# Patient Record
Sex: Female | Born: 1970 | Race: White | Hispanic: No | Marital: Single | Smoking: Never smoker
Health system: Southern US, Community
[De-identification: ages and names within clinical notes are randomized; demographics above are authoritative.]

## PROBLEM LIST (undated history)

## (undated) DIAGNOSIS — J45909 Unspecified asthma, uncomplicated: Secondary | ICD-10-CM

## (undated) DIAGNOSIS — I10 Essential (primary) hypertension: Secondary | ICD-10-CM

## (undated) DIAGNOSIS — F1020 Alcohol dependence, uncomplicated: Secondary | ICD-10-CM

## (undated) DIAGNOSIS — Z8679 Personal history of other diseases of the circulatory system: Secondary | ICD-10-CM

## (undated) DIAGNOSIS — F988 Other specified behavioral and emotional disorders with onset usually occurring in childhood and adolescence: Secondary | ICD-10-CM

## (undated) DIAGNOSIS — E781 Pure hyperglyceridemia: Secondary | ICD-10-CM

## (undated) HISTORY — PX: OTHER SURGICAL HISTORY: SHX169

## (undated) HISTORY — DX: Unspecified asthma, uncomplicated: J45.909

## (undated) HISTORY — DX: Personal history of other diseases of the circulatory system: Z86.79

## (undated) HISTORY — DX: Other specified behavioral and emotional disorders with onset usually occurring in childhood and adolescence: F98.8

---
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
## 1998-08-01 ENCOUNTER — Emergency Department (HOSPITAL_COMMUNITY): Admission: EM | Admit: 1998-08-01 | Discharge: 1998-08-01 | Payer: Self-pay | Admitting: Emergency Medicine

## 1999-10-29 ENCOUNTER — Other Ambulatory Visit: Admission: RE | Admit: 1999-10-29 | Discharge: 1999-10-29 | Payer: Self-pay | Admitting: Gynecology

## 2001-07-02 ENCOUNTER — Other Ambulatory Visit: Admission: RE | Admit: 2001-07-02 | Discharge: 2001-07-02 | Payer: Self-pay | Admitting: Gynecology

## 2002-12-24 ENCOUNTER — Encounter: Admission: RE | Admit: 2002-12-24 | Discharge: 2002-12-24 | Payer: Self-pay | Admitting: Gynecology

## 2002-12-24 ENCOUNTER — Encounter: Payer: Self-pay | Admitting: Gynecology

## 2003-02-04 ENCOUNTER — Other Ambulatory Visit: Admission: RE | Admit: 2003-02-04 | Discharge: 2003-02-04 | Payer: Self-pay | Admitting: Gynecology

## 2003-03-22 ENCOUNTER — Emergency Department (HOSPITAL_COMMUNITY): Admission: EM | Admit: 2003-03-22 | Discharge: 2003-03-22 | Payer: Self-pay | Admitting: Emergency Medicine

## 2003-03-23 ENCOUNTER — Emergency Department (HOSPITAL_COMMUNITY): Admission: EM | Admit: 2003-03-23 | Discharge: 2003-03-23 | Payer: Self-pay | Admitting: Emergency Medicine

## 2004-03-21 ENCOUNTER — Encounter: Payer: Self-pay | Admitting: Internal Medicine

## 2004-03-21 LAB — HM MAMMOGRAPHY

## 2004-03-21 LAB — CONVERTED CEMR LAB

## 2004-05-03 ENCOUNTER — Ambulatory Visit: Payer: Self-pay | Admitting: Internal Medicine

## 2004-05-12 ENCOUNTER — Ambulatory Visit: Payer: Self-pay | Admitting: Internal Medicine

## 2004-05-24 ENCOUNTER — Ambulatory Visit: Payer: Self-pay

## 2004-06-24 ENCOUNTER — Ambulatory Visit: Payer: Self-pay | Admitting: Internal Medicine

## 2004-08-19 ENCOUNTER — Encounter: Admission: RE | Admit: 2004-08-19 | Discharge: 2004-08-19 | Payer: Self-pay | Admitting: Gynecology

## 2004-10-29 ENCOUNTER — Ambulatory Visit: Payer: Self-pay | Admitting: Internal Medicine

## 2004-11-30 ENCOUNTER — Other Ambulatory Visit: Admission: RE | Admit: 2004-11-30 | Discharge: 2004-11-30 | Payer: Self-pay | Admitting: Gynecology

## 2005-05-26 ENCOUNTER — Ambulatory Visit: Payer: Self-pay | Admitting: Internal Medicine

## 2005-07-06 ENCOUNTER — Emergency Department (HOSPITAL_COMMUNITY): Admission: EM | Admit: 2005-07-06 | Discharge: 2005-07-06 | Payer: Self-pay | Admitting: Emergency Medicine

## 2005-07-06 ENCOUNTER — Ambulatory Visit: Payer: Self-pay | Admitting: Internal Medicine

## 2005-10-11 ENCOUNTER — Ambulatory Visit: Payer: Self-pay | Admitting: Pulmonary Disease

## 2006-05-20 ENCOUNTER — Encounter: Payer: Self-pay | Admitting: Internal Medicine

## 2006-05-20 LAB — CONVERTED CEMR LAB

## 2006-08-28 ENCOUNTER — Ambulatory Visit: Payer: Self-pay | Admitting: Internal Medicine

## 2006-08-30 ENCOUNTER — Encounter: Payer: Self-pay | Admitting: Internal Medicine

## 2006-09-08 ENCOUNTER — Ambulatory Visit: Payer: Self-pay | Admitting: Internal Medicine

## 2006-09-08 LAB — CONVERTED CEMR LAB
ALT: 31 units/L (ref 0–40)
Alkaline Phosphatase: 34 units/L — ABNORMAL LOW (ref 39–117)
BUN: 10 mg/dL (ref 6–23)
Bilirubin Urine: NEGATIVE
Chloride: 106 meq/L (ref 96–112)
Creatinine, Ser: 0.7 mg/dL (ref 0.4–1.2)
Direct LDL: 110.3 mg/dL
Eosinophils Absolute: 0.1 10*3/uL (ref 0.0–0.6)
Eosinophils Relative: 3.6 % (ref 0.0–5.0)
GFR calc Af Amer: 122 mL/min
Glucose, Bld: 97 mg/dL (ref 70–99)
HCT: 38.7 % (ref 36.0–46.0)
HDL: 91.7 mg/dL (ref >39.0)
Hemoglobin: 13.2 g/dL (ref 12.0–15.0)
Ketones, ur: NEGATIVE mg/dL
Lymphocytes Relative: 36.9 % (ref 12.0–46.0)
MCHC: 34.1 g/dL (ref 30.0–36.0)
MCV: 96.6 fL (ref 78.0–100.0)
Neutro Abs: 2.1 10*3/uL (ref 1.4–7.7)
Platelets: 267 10*3/uL (ref 150–400)
RBC: 4.01 M/uL (ref 3.87–5.11)
RDW: 11.5 % (ref 11.5–14.6)
Specific Gravity, Urine: 1.02 (ref 1.000–1.03)
Total Bilirubin: 0.6 mg/dL (ref 0.3–1.2)
Total Protein: 6.6 g/dL (ref 6.0–8.3)
Urine Glucose: NEGATIVE mg/dL
Urobilinogen, UA: 0.2 (ref 0.0–1.0)
VLDL: 16 mg/dL (ref 0–40)

## 2006-09-21 ENCOUNTER — Ambulatory Visit: Payer: Self-pay | Admitting: Internal Medicine

## 2006-11-03 ENCOUNTER — Encounter: Payer: Self-pay | Admitting: Internal Medicine

## 2006-11-03 ENCOUNTER — Ambulatory Visit: Payer: Self-pay | Admitting: Internal Medicine

## 2006-11-03 DIAGNOSIS — Z8679 Personal history of other diseases of the circulatory system: Secondary | ICD-10-CM | POA: Insufficient documentation

## 2007-07-31 ENCOUNTER — Ambulatory Visit (HOSPITAL_COMMUNITY): Admission: RE | Admit: 2007-07-31 | Discharge: 2007-07-31 | Payer: Self-pay | Admitting: Obstetrics and Gynecology

## 2007-07-31 ENCOUNTER — Encounter (INDEPENDENT_AMBULATORY_CARE_PROVIDER_SITE_OTHER): Payer: Self-pay | Admitting: Obstetrics and Gynecology

## 2008-10-04 ENCOUNTER — Inpatient Hospital Stay (HOSPITAL_COMMUNITY): Admission: AD | Admit: 2008-10-04 | Discharge: 2008-10-04 | Payer: Self-pay | Admitting: Obstetrics and Gynecology

## 2008-10-06 ENCOUNTER — Inpatient Hospital Stay (HOSPITAL_COMMUNITY): Admission: AD | Admit: 2008-10-06 | Discharge: 2008-10-08 | Payer: Self-pay | Admitting: Obstetrics and Gynecology

## 2009-02-25 ENCOUNTER — Ambulatory Visit: Payer: Self-pay | Admitting: Internal Medicine

## 2009-02-25 DIAGNOSIS — J019 Acute sinusitis, unspecified: Secondary | ICD-10-CM | POA: Insufficient documentation

## 2009-04-27 ENCOUNTER — Telehealth: Payer: Self-pay | Admitting: Internal Medicine

## 2009-06-03 ENCOUNTER — Ambulatory Visit: Payer: Self-pay | Admitting: Internal Medicine

## 2009-06-03 DIAGNOSIS — R634 Abnormal weight loss: Secondary | ICD-10-CM | POA: Insufficient documentation

## 2009-06-03 DIAGNOSIS — R062 Wheezing: Secondary | ICD-10-CM | POA: Insufficient documentation

## 2010-03-21 HISTORY — PX: SHOULDER SURGERY: SHX246

## 2010-04-20 NOTE — Progress Notes (Signed)
    Immunization History:  Influenza Immunization History:    Influenza:  historical (04/27/2009)

## 2010-04-20 NOTE — Miscellaneous (Signed)
Summary: Optometrist Release   Imported By: Lester Clara 06/09/2009 10:57:09  _____________________________________________________________________  External Attachment:    Type:   Image     Comment:   External Document

## 2010-04-20 NOTE — Assessment & Plan Note (Signed)
Summary: SINUS PROBLEM--STC   Vital Signs:  Patient profile:   40 year old female Height:      62.5 inches Weight:      123 pounds BMI:     22.22 O2 Sat:      97 % on Room air Temp:     97.8 degrees F oral Pulse rate:   63 / minute BP sitting:   110 / 72  (left arm) Cuff size:   regular  Vitals Entered ByZella Ball Ewing (June 03, 2009 10:50 AM)  O2 Flow:  Room air CC: sinus congestion, right ear pressure, chest heavy/RE   CC:  sinus congestion, right ear pressure, and chest heavy/RE.  History of Present Illness: here with 2 to 3 days onset mod to severe facial pain, pressure, fever, greenish d/c, right > left with teeth pain on the right as well (no obviuos teeth or gum sweling);  has some mild ST, but Pt denies CP, sob, doe, orthopnea, pnd, worsening LE edema, palps, dizziness or syncope , but had some mild wheezing this am, not activity limiting.    Problems Prior to Update: 1)  Wheezing  (ICD-786.07) 2)  Weight Loss  (ICD-783.21) 3)  Sinusitis- Acute-nos  (ICD-461.9) 4)  Sinusitis- Acute-nos  (ICD-461.9) 5)  Heart Murmur, Hx of  (ICD-V12.50)  Medications Prior to Update: 1)  Azithromycin 250 Mg Tabs (Azithromycin) .... 2po Qd For 1 Day, Then 1po Qd For 4days, Then Stop  Current Medications (verified): 1)  Clarithromycin 500 Mg Tabs (Clarithromycin) .Marland Kitchen.. 1 By Mouth Two Times A Day 2)  Prednisone 10 Mg Tabs (Prednisone) .... 3po Qd For 3days, Then 2po Qd For 3days, Then 1po Qd For 3days, Then Stop  Allergies (verified): 1)  ! 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  , mild ill  Head:  normocephalic  and atraumatic.   Eyes:  vision grossly intact, pupils equal, and pupils round.   Ears:  bilat tm's mild erythema, sinus tender bilat Nose:  nasal dischargemucosal pallor and mucosal edema.   Mouth:  pharyngeal erythema and fair dentition.   Neck:  supple and cervical lymphadenopathy.   Lungs:  normal respiratory effort, R decreased breath sounds, R wheezes, L decreased breath sounds, and L wheezes. (very mild bilat) Heart:  normal rate and regular rhythm.   Extremities:  no edema, no erythema    Impression & Recommendations:  Problem # 1:  SINUSITIS- ACUTE-NOS (ICD-461.9)  Her updated medication list for this problem includes:    Clarithromycin 500 Mg Tabs (Clarithromycin) .Marland Kitchen... 1 by mouth two times a day treat as above, f/u any worsening signs or symptoms   Problem # 2:  WHEEZING (ICD-786.07) mild, related to above, tx with prednisone burst and taper off , to f/u any worsening s/s  Complete Medication List: 1)  Clarithromycin 500 Mg Tabs (Clarithromycin) .Marland Kitchen.. 1 by mouth two times a day 2)  Prednisone 10 Mg Tabs (Prednisone) .... 3po qd for 3days, then 2po qd for 3days, then 1po qd for 3days, then stop  Patient Instructions: 1)  Please take all new medications as prescribed 2)  Continue all previous medications as before  this visit  3)  Please schedule a follow-up appointment as needed. Prescriptions: PREDNISONE 10 MG TABS (PREDNISONE) 3po qd for 3days, then 2po qd for 3days, then 1po qd for 3days, then stop  #18 x 0   Entered and Authorized by:   Corwin Levins MD   Signed by:   Corwin Levins MD on 06/03/2009   Method used:   Print then Give to Patient   RxID:   [id number] CLARITHROMYCIN 500 MG TABS (CLARITHROMYCIN) 1 by mouth two times a day  #20 x 0   Entered and Authorized by:   Corwin Levins MD   Signed by:   Corwin Levins MD on 06/03/2009   Method used:   Print then Give to Patient   RxID:   [id number]

## 2010-05-05 ENCOUNTER — Encounter: Payer: Self-pay | Admitting: Internal Medicine

## 2010-05-18 NOTE — Miscellaneous (Signed)
Summary: Flu Vaccination/[employer] [facility]  Flu Vaccination/[employer] [facility]   Imported By: Sherian Rein 05/10/2010 08:10:44  _____________________________________________________________________  External Attachment:    Type:   Image     Comment:   External Document

## 2010-06-01 ENCOUNTER — Encounter: Payer: Self-pay | Admitting: Internal Medicine

## 2010-06-01 ENCOUNTER — Ambulatory Visit (INDEPENDENT_AMBULATORY_CARE_PROVIDER_SITE_OTHER): Payer: BC Managed Care – PPO | Admitting: Internal Medicine

## 2010-06-01 ENCOUNTER — Other Ambulatory Visit: Payer: BC Managed Care – PPO

## 2010-06-01 ENCOUNTER — Other Ambulatory Visit: Payer: Self-pay | Admitting: Internal Medicine

## 2010-06-01 DIAGNOSIS — Z0000 Encounter for general adult medical examination without abnormal findings: Secondary | ICD-10-CM

## 2010-06-01 DIAGNOSIS — E785 Hyperlipidemia, unspecified: Secondary | ICD-10-CM

## 2010-06-01 DIAGNOSIS — J45909 Unspecified asthma, uncomplicated: Secondary | ICD-10-CM | POA: Insufficient documentation

## 2010-06-01 LAB — URINALYSIS, ROUTINE W REFLEX MICROSCOPIC
Bilirubin Urine: NEGATIVE
Ketones, ur: NEGATIVE
Leukocytes, UA: NEGATIVE
Nitrite: NEGATIVE
Urine Glucose: NEGATIVE
Urobilinogen, UA: 0.2 (ref 0.0–1.0)

## 2010-06-01 LAB — CBC WITH DIFFERENTIAL/PLATELET
Basophils Absolute: 0 10*3/uL (ref 0.0–0.1)
Eosinophils Absolute: 0.1 10*3/uL (ref 0.0–0.7)
HCT: 38.4 % (ref 36.0–46.0)
Lymphs Abs: 1.5 10*3/uL (ref 0.7–4.0)
MCHC: 34.6 g/dL (ref 30.0–36.0)
MCV: 96.9 fl (ref 78.0–100.0)
Neutro Abs: 3 10*3/uL (ref 1.4–7.7)
RBC: 3.96 Mil/uL (ref 3.87–5.11)
WBC: 5 10*3/uL (ref 4.5–10.5)

## 2010-06-01 LAB — BASIC METABOLIC PANEL
Calcium: 8.9 mg/dL (ref 8.4–10.5)
GFR: 109.23 mL/min (ref >60.00)
Glucose, Bld: 92 mg/dL (ref 70–99)
Potassium: 3.8 mEq/L (ref 3.5–5.1)
Sodium: 137 mEq/L (ref 135–145)

## 2010-06-01 LAB — LDL CHOLESTEROL, DIRECT: Direct LDL: 110.7 mg/dL

## 2010-06-01 LAB — HEPATIC FUNCTION PANEL
Albumin: 4.2 g/dL (ref 3.5–5.2)
Bilirubin, Direct: 0.1 mg/dL (ref 0.0–0.3)
Total Protein: 6.6 g/dL (ref 6.0–8.3)

## 2010-06-01 LAB — TSH: TSH: 1.52 u[IU]/mL (ref 0.35–5.50)

## 2010-06-01 LAB — LIPID PANEL: HDL: 91.3 mg/dL (ref >39.00)

## 2010-06-08 NOTE — Assessment & Plan Note (Signed)
Summary: CHEST TIGHTNESS WHEN EXERCISING-FEEL LIKE BREATHING THROUGH A...   Vital Signs:  Patient profile:   40 year old female Height:      62.5 inches Weight:      118.25 pounds BMI:     21.36 O2 Sat:      98 % on Room air Temp:     98.7 degrees F oral Pulse rate:   65 / minute BP sitting:   100 / 58  (left arm) Cuff size:   regular  Vitals Entered By: Zella Ball Ewing CMA Duncan Dull) (June 01, 2010 2:27 PM)  O2 Flow:  Room air  CC: SOB when exercising, fatigue/RE   CC:  SOB when exercising and fatigue/RE.  History of Present Illness: here for wellness, also with chest tightness and "breathing through a straw" feeling with excercise and sometimes without, ongoing for years, mild worse recetnly, and her daughter's albuterol inhaler helped.  Pt denies CP,  orthopnea, pnd, worsening LE edema, palps, dizziness or syncope , and no nighttime awakenings.  Pt denies new neuro symptoms such as headache, facial or extremity weakness  Pt denies polydipsia, polyuria  Overall good compliance with meds, trying to follow low chol diet, wt stable, little excercise however .  No fever, wt loss, night sweats, loss of appetite or other constitutional symptoms  Overall good compliance with meds, and good tolerability.  Denies worsening depressive symptoms, suicidal ideation, or panic.   Pt states good ability with ADL's, low fall risk, home safety reviewed and adequate, no significant change in hearing or vision, trying to follow lower chol diet, and very active , trains and participates regularly in mini-triathlons.    Preventive Screening-Counseling & Management  Alcohol-Tobacco     Smoking Status: never      Drug Use:  no.    Problems Prior to Update: 1)  Preventive Health Care  (ICD-V70.0) 2)  Asthma  (ICD-493.90) 3)  Wheezing  (ICD-786.07) 4)  Weight Loss  (ICD-783.21) 5)  Sinusitis- Acute-nos  (ICD-461.9) 6)  Sinusitis- Acute-nos  (ICD-461.9) 7)  Heart Murmur, Hx of  (ICD-V12.50)  Medications  Prior to Update: 1)  Clarithromycin 500 Mg Tabs (Clarithromycin) .Marland Kitchen.. 1 By Mouth Two Times A Day 2)  Prednisone 10 Mg Tabs (Prednisone) .... 3po Qd For 3days, Then 2po Qd For 3days, Then 1po Qd For 3days, Then Stop  Current Medications (verified): 1)  Symbicort 160-4.5 Mcg/act Aero (Budesonide-Formoterol Fumarate) .... 2 Puffs Two Times A Day 2)  Proair Hfa 108 (90 Base) Mcg/act Aers (Albuterol Sulfate) .... 2 Puffs Four Times Per Day  Allergies (verified): 1)  ! 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         all otherwise negative per pt -    Physical Exam  General:  alert and well-developed.   Head:  normocephalic and atraumatic.   Eyes:  vision grossly intact, pupils equal, and pupils round.   Ears:  R ear normal.  , left tm mild erythema,  canals clear, sinus nontender Nose:  no external deformity and no nasal discharge.   Mouth:  no gingival abnormalities and pharyngeal erythema.   Neck:  supple and no masses.   Lungs:  normal respiratory effort and normal breath sounds.   Heart:  normal  rate and regular rhythm.   Abdomen:  soft, non-tender, and normal bowel sounds.   Msk:  no joint tenderness and no joint swelling.   Extremities:  no edema, no erythema  Neurologic:  cranial nerves II-XII intact, strength normal in all extremities, and gait normal.   Skin:  color normal and no rashes.   Psych:  not anxious appearing and not depressed appearing.     Impression & Recommendations:  Problem # 1:  Preventive Health Care (ICD-V70.0) Overall doing well, age appropriate education and counseling updated, referral for preventive services and immunizations addressed, dietary counseling and smoking status adressed , most recent labs reviewed, ecg reviewed I have personally reviewed and have noted 1.The patient's medical and social history 2.Their use of alcohol, tobacco or illicit drugs 3.Their current medications and supplements 4. Functional ability including ADL's, fall risk, home safety risk, hearing & visual impairment  5.Diet and physical activities 6.Evidence for depression or mood disorders The patients weight, height, BMI  have been recorded in the chart I have made referrals, counseling and provided education to the patient based review of the above  Orders: EKG w/ Interpretation (93000) TLB-BMP (Basic Metabolic Panel-BMET) (80048-METABOL) TLB-CBC Platelet - w/Differential (85025-CBCD) TLB-Hepatic/Liver Function Pnl (80076-HEPATIC) TLB-TSH (Thyroid Stimulating Hormone) (84443-TSH) TLB-Lipid Panel (80061-LIPID) TLB-Udip ONLY (81003-UDIP)  Problem # 2:  ASTHMA (ICD-493.90)  The following medications were removed from the medication list:    Prednisone 10 Mg Tabs (Prednisone) .Marland Kitchen... 3po qd for 3days, then 2po qd for 3days, then 1po qd for 3days, then stop Her updated medication list for this problem includes:    Symbicort 160-4.5 Mcg/act Aero (Budesonide-formoterol fumarate) .Marland Kitchen... 2 puffs two times a day    Proair Hfa 108 (90 Base) Mcg/act Aers (Albuterol sulfate)  .Marland Kitchen... 2 puffs four times per day essentially mild intermittent at best vs excercise induced or mix;  treat as above, f/u any worsening signs or symptoms   Pulmonary Functions Reviewed: O2 sat: 98 (06/01/2010)  Complete Medication List: 1)  Symbicort 160-4.5 Mcg/act Aero (Budesonide-formoterol fumarate) .... 2 puffs two times a day 2)  Proair Hfa 108 (90 Base) Mcg/act Aers (Albuterol sulfate) .... 2 puffs four times per day  Patient Instructions: 1)  Please take all new medications as prescribed 2)  Continue all previous medications as before this visit  3)  Please go to the Lab in the basement for your blood and/or urine tests today 4)  Please call the number on the [facility] Card for results of your testing  5)  Please schedule a follow-up appointment in 1 year or as needed  Prescriptions: PROAIR HFA 108 (90 BASE) MCG/ACT AERS (ALBUTEROL SULFATE) 2 puffs four times per day  #1 x 11   Entered and Authorized by:   Corwin Levins MD   Signed by:   Corwin Levins MD on 06/01/2010   Method used:   Print then Give to Patient   RxID:   [id number] SYMBICORT 160-4.5 MCG/ACT AERO (BUDESONIDE-FORMOTEROL FUMARATE) 2 puffs two times a day  #1 x 11  Entered and Authorized by:   Corwin Levins MD   Signed by:   Corwin Levins MD on 06/01/2010   Method used:   Print then Give to Patient   RxID:   [id number]    Orders Added: 1)  EKG w/ Interpretation [93000] 2)  TLB-BMP (Basic Metabolic Panel-BMET) [80048-METABOL] 3)  TLB-CBC Platelet - w/Differential [85025-CBCD] 4)  TLB-Hepatic/Liver Function Pnl [80076-HEPATIC] 5)  TLB-TSH (Thyroid Stimulating Hormone) [84443-TSH] 6)  TLB-Lipid Panel [80061-LIPID] 7)  TLB-Udip ONLY [81003-UDIP] 8)  Est. Patient 40-64 years [14782]

## 2010-06-27 LAB — CBC
HCT: 35.9 % — ABNORMAL LOW (ref 36.0–46.0)
Hemoglobin: 9 g/dL — ABNORMAL LOW (ref 12.0–15.0)
MCHC: 34.5 g/dL (ref 30.0–36.0)
MCHC: 34.5 g/dL (ref 30.0–36.0)
Platelets: 140 10*3/uL — ABNORMAL LOW (ref 150–400)
RBC: 2.66 MIL/uL — ABNORMAL LOW (ref 3.87–5.11)
RBC: 3.69 MIL/uL — ABNORMAL LOW (ref 3.87–5.11)
WBC: 9.1 10*3/uL (ref 4.0–10.5)

## 2010-06-27 LAB — RPR: RPR Ser Ql: NONREACTIVE

## 2010-08-03 NOTE — Op Note (Signed)
NAME:  Aimee Thompson, Aimee Thompson             ACCOUNT NO.:  [id number]   MEDICAL RECORD NO.:  [id number]          PATIENT TYPE:  INP   LOCATION:  9126                          FACILITY:  [facility]   PHYSICIAN:  Lenoard Aden, M.D.DATE OF BIRTH:  1971-02-03   DATE OF PROCEDURE:  10/06/2008  DATE OF DISCHARGE:                               OPERATIVE REPORT   PREOPERATIVE DIAGNOSES:  1. A 38-week intrauterine pregnancy in active labor.  2. History of severe shoulder dystocia with clavicular fracture and      fourth-degree laceration for primary cesarean section.   POSTOPERATIVE DIAGNOSES:  1. A 38-week intrauterine pregnancy in active labor.  2. History of severe shoulder dystocia with clavicular fracture and      fourth-degree laceration for primary cesarean section.   PROCEDURE:  Primary low segment transverse cesarean section.   SURGEON:  Lenoard Aden, MD   ANESTHESIA:  Spinal.   ESTIMATED BLOOD LOSS:  1000 mL.   COMPLICATIONS:  None.   DRAINS:  Foley.   COUNTS:  Correct.   The patient taken to recovery in good condition.   FINDINGS:  Full-term living female, occiput anterior position, placenta  posteriorly located, manually intact, removed three-vessel cord, two-  layer uterine closure.  Normal tubes.  Normal ovaries.   BRIEF OPERATIVE NOTE:  After being apprised of the risks of anesthesia,  infection, bleeding, injury to abdominal organs, need for repair,  delayed versus immediate complications which include bowel and bladder  injury, the patient was brought to the operating room where she was  administered a spinal anesthetic without complications, prepped and  draped in usual sterile fashion.  A Foley catheter was placed.  After  achieving adequate anesthesia, dilute Marcaine solution was placed.  A  Pfannenstiel skin incision was made with a scalpel and carried down to  the fascia, which was nicked in the midline and opened transversely  using Mayo scissors.  Rectus  muscles were dissected sharply in the  midline.  Peritoneum was entered sharply.  Bladder blade placed.  Visceral peritoneum was scored sharply off the lower uterine segment.  Kerr hysterotomy incision made and extended atraumatically in a cephalad  manner.  Atraumatic delivery of a full-term living female from an occiput  anterior position, handed to pediatricians in attendance.  Apgars 8 and  9.  Cord blood collected.  Placenta delivered, manually intact from a  posterior location.  Uterus exteriorized, curetted using a dry lap pack  and closed in 2 running imbricating layers of 0 Monocryl suture.  Good  hemostasis was noted.  Irrigation accomplished.  Bladder flap inspected,  found to be hemostatic.  Peritoneum reapproximated using a 2-0 chromic  in continuous running fashion.  Dilute Marcaine solution placed in the  peritoneal cavity.  Fascia closed using 0 Monocryl in continuous running  fashion.  Skin closed using a subcuticular suture of a 4-0 Monocryl and  Dermabond was placed.  The patient tolerated the procedure well,  transferred to recovery in good condition.      Lenoard Aden, M.D.  Electronically Signed     RJT/MEDQ  D:  10/06/2008  T:  10/07/2008  Job:  [id number]

## 2010-08-03 NOTE — H&P (Signed)
NAME:  Aimee Thompson, Aimee Thompson             ACCOUNT NO.:  [id number]   MEDICAL RECORD NO.:  [id number]          PATIENT TYPE:  INP   LOCATION:  9126                          FACILITY:  [facility]   PHYSICIAN:  Lenoard Aden, M.D.DATE OF BIRTH:  March 03, 1971   DATE OF ADMISSION:  10/06/2008  DATE OF DISCHARGE:                              HISTORY & PHYSICAL   CHIEF COMPLAINT:  Labor.   She is a 40 year old white female G3, P1 at 38 plus weeks in active  labor with bloody discharge and uncomfortable contractions today, seen  in the office with cervical change from 2-3 cm, now 4 cm, presents for  primary C-section due to history of shoulder dystocia and fourth-degree  laceration.   She has no known drug allergies.   MEDICATIONS:  Prenatal vitamins.   She has family history of heart disease, kidney stone, hypertension,  COPD, and varicosities.   Three previous pregnancies, one term as noted and one SAB and one  ectopic, status post surgery with no complications.   PHYSICAL EXAMINATION:  GENERAL:  She is a well-developed, well-nourished  white female, moderate amount of distress.  HEENT: Normal.  LUNGS: Clear.  HEART:  Regular rate and rhythm.  ABDOMEN:  Soft, gravid, and nontender.  Estimated fetal weight is 7-1/2  pounds.  Cervix is 4, 80% vertex, -2.  EXTREMITIES:  There are no cords.  NEUROLOGIC:  Nonfocal.  SKIN:  Intact.   IMPRESSION:  1. Term intrauterine pregnancy and active labor.  2. Previous history of shoulder dystocia with clavicular fracture and      fourth-degree laceration.   PLAN:  Plan is to proceed with primary C-section.  Risks of anesthesia,  infection, bleeding, injury to abdominal organs, need for repair,  delayed versus immediate complications to include bowel and bladder  injury noted.  The patient now just wishes to proceed.     Lenoard Aden, M.D.  Electronically Signed    RJT/MEDQ  D:  10/06/2008  T:  10/07/2008  Job:  [id number]

## 2010-08-03 NOTE — Discharge Summary (Signed)
NAME:  Aimee Thompson, Aimee Thompson             ACCOUNT NO.:  [id number]   MEDICAL RECORD NO.:  [id number]          PATIENT TYPE:  INP   LOCATION:  9126                          [facility]ILITY:  [facility]   PHYSICIAN:  Lenoard Aden, M.D.DATE OF BIRTH:  22-Dec-1970   DATE OF ADMISSION:  10/06/2008  DATE OF DISCHARGE:  10/08/2008                               DISCHARGE SUMMARY   ADMITTING DIAGNOSES:  Term gestation, onset labor, planned cesarean  section due to history of shoulder dystocia, and fourth-degree  laceration.   DISCHARGE DIAGNOSIS:  Delivery by cesarean section of term gestation.   The patient is a G4, P1-0-1-1 at 38 plus weeks' gestation with an [facility] of  October 15, 2008.  Pregnancy history is positive for 1 ectopic.  Prenatal  care at [facility] since 7 weeks.  Dr. Billy Coast is primary Aimee Thompson.   PRENATAL LABS:  The patient is Rh positive, rubella immune, GBS  negative.  RPR is nonreactive.  Hepatitis B is negative and HIV is  negative.  Prenatal course has been uneventful.   MEDICAL HISTORY:  Nonsignificant.   ALLERGIES:  The patient has no known allergies.   CURRENT MEDICATIONS AT THE TIME OF ADMISSION:  Prenatal vitamins 1  tablet p.o. daily.   ADMISSION REASON:  Onset of labor and planned C-section.   ADMISSION LABS:  CBC, white blood count is 12.9, hemoglobin 12.4,  hematocrit 35.9, and platelet count of 164.  The patient's surgical date  was October 06, 2008 by Dr. Billy Coast, delivered a female infant, breast-  feeding, to newborn nursery.  Circumcision was done per Dr. Billy Coast.  Postop course has been uneventful.   POSTOPERATIVE LABS:  CBC with a white blood count of 9.1, hemoglobin  9.0, hematocrit 26, and a platelet count of 148.   VITAL SIGNS:  At the time of discharge are stable and include  temperature of 97.9, heart rate 72, respiratory rate 18, and blood  pressure 93/56.   Incision was closed with Dermabond, and it is clean, dry, and intact.  No edema and no ecchymosis and  no drainage noted.   CONDITION AT TIME OF DISCHARGE:  Stable.   DIET:  Regular.   ACTIVITY:  Ad lib.   MEDICATIONS:  1. Ibuprofen 600 mg p.o. to every 6 hours p.r.n. pain, dispensed #30,      refill x1.  2. Percocet 5/325 one-two tablets p.o. every 4 hours p.r.n. pain,      dispensed #26.  3. Niferex 100 mg p.o. daily, dispensed 45 refill, x1.  4. Colace 100 mg p.o. daily, dispensed #20, no refills.   The patient has received the [facility] postpartum instruction  booklet.  The patient has also been advised to follow up in the office  in 6 weeks.      Aimee Thompson, CNM      Lenoard Aden, M.D.  Electronically Signed    DL/MEDQ  D:  16/12/9602  T:  10/08/2008  Job:  [id number]

## 2010-08-03 NOTE — Op Note (Signed)
NAME:  Aimee Thompson, Aimee Thompson             ACCOUNT NO.:  [id number]   MEDICAL RECORD NO.:  [id number]          PATIENT TYPE:  AMB   LOCATION:  [facility]                          FACILITY:  [facility]   PHYSICIAN:  Lenoard Aden, M.D.DATE OF BIRTH:  [birth year]   DATE OF PROCEDURE:  DATE OF DISCHARGE:  07/31/2007                               OPERATIVE REPORT   PREOPERATIVE DIAGNOSES:  Missed abortion at 10 weeks.   POSTOPERATIVE DIAGNOSES:  Missed abortion at 10 weeks.   PROCEDURE:  Suction dilatation and evacuation.   SURGEON:  Lenoard Aden, MD   ANESTHESIA:  MAC, paracervical.   ESTIMATED BLOOD LOSS:  50 mL.   COMPLICATIONS:  None.   DRAINS:  None.   COUNTS:  Correct.   The patient to recovery in good condition.  Products of conception noted  to pathology.   BRIEF OPERATIVE NOTE:  After being appraised the risks of anesthesia,  infection, bleeding, possibility of uterine perforation, and need for  repair.  The patient was taken to the operating room, anesthesia was  administered IV sedation without difficulty.  Prepped and draped in the  usual sterile fashion.  Catheterized until the bladder was emptied.  Examination reveals a severely retroflexed and deviated right uterus to  the left which is about 8- to 10-week size.  Cervix was grasped with a  single-tooth tenaculum.  Placement of a dilute  paracervical block with  2% Xylocaine solution in a standard fashion, 20 mL total.  Cervix was  easily dilated up to #25 Pratt dilator and an 8-mm curved suction  curette placed.  Aspiration reveals products of conception, which were  noted and sent to pathology.  Curettage in a 4-quadrant method revealed  the  cavity to be empty.  Repeat suction confirms.  Good hemostasis was  noted.  All instruments were removed.  The patient tolerates the  procedure well and was transferred to recovery in good condition.      Lenoard Aden, M.D.  Electronically Signed     RJT/MEDQ  D:   07/31/2007  T:  08/01/2007  Job:  [id number]

## 2010-08-06 ENCOUNTER — Encounter: Payer: Self-pay | Admitting: Internal Medicine

## 2010-08-06 ENCOUNTER — Ambulatory Visit: Payer: BC Managed Care – PPO | Admitting: Internal Medicine

## 2010-08-06 DIAGNOSIS — Z0000 Encounter for general adult medical examination without abnormal findings: Secondary | ICD-10-CM | POA: Insufficient documentation

## 2010-08-06 NOTE — Assessment & Plan Note (Signed)
[facility]                               PULMONARY OFFICE NOTE   SUMMERLYN, FICKEL                      MRN:          [id number]  DATE:10/11/2005                            DOB:          [birth year]    CHIEF COMPLAINT:  Cough with a recent upper respiratory infection.   [name] is a 40 year old, white female, who presents after  having an upper respiratory type infection approximately one week ago.  She  notes that her chest feels heavy and she has a non-productive cough.  VITAL  SIGNS:  BLOOD PRESSURE:  100/78.  PULSE:  63.  RESPIRATION:  Over 16.  02  SATURATION:  99% of room air.  WEIGHT:  114 pounds.  GENERAL:  Well-  nourished, well-developed, white female, in no acute distress.   PHYSICAL EXAMINATION:  HEENT:  Tympanic membranes are unremarkable.  ALA NARIS:  Unremarkable.  ORAL PHARYNX:  Unremarkable.  CHEST:  Clear to auscultation.  HEART SOUNDS:  Regular, with slight murmur.  ABDOMEN:  Soft, non-tender.  EXTREMITIES:  Without edema.   PAST MEDICAL HISTORY:  Her past medical history is negative for allergies.  History of  heart murmur, mild hypercholesterolemia.  Status post left knee  repair x2.  Status post positive __________ lumpectomy.   ALLERGIES/MEDICATIONS:  1.  Multivitamin, daily.  2.  Advil, p.r.n.   IMPRESSION/PLAN:  Recent upper respiratory infection syndrome.  Now with a  mild cough, she is directed to use Robitussin DM for cough suppression, and  should she have any further problems, followup with myself or her primary  care physician, Dr. Melvyn Novas, for further evaluation and treatment.                                   Devra Dopp, MSN, ACNP                                Scott M. Kriste Basque, MD   SM/MedQ  DD:  10/11/2005  DT:  10/11/2005  Job #:  [id number]

## 2010-08-06 NOTE — Consult Note (Signed)
NAME:  Aimee, Thompson             ACCOUNT NO.:  [id number]   MEDICAL RECORD NO.:  [id number]          PATIENT TYPE:  INP   LOCATION:  1828                         FACILITY:  [facility]   PHYSICIAN:  Gabrielle Dare. Janee Morn, M.D.DATE OF BIRTH:  07/17/1970   DATE OF CONSULTATION:  07/06/2005  DATE OF DISCHARGE:                                   CONSULTATION   GENERAL SURGERY CONSULTATION  - EMERGENCY ROOM   DATE OF CONSULTATION:  July 06, 2005.   CONSULTING PHYSICIAN:  Gabrielle Dare. Janee Morn, M.D.   CHIEF COMPLAINT:  Right lower quadrant abdominal pain.   HISTORY OF PRESENT ILLNESS:  [name] is a 40 year old female patient who  reports acute onset of right sided abdominal pain yesterday, initially  located in the epigastric and right middle areas of the abdomen, somewhat  less so over the right lower quadrant.  After about 12 hours it became more  focal to the right lower quadrant.  She had associated nausea, anorexia, no  emesis.  She had noticed that if she walked or moved around significantly  she would have increased pain.  She went to see her primary care physician,  Dr. Jonny Ruiz, who suspected appendicitis.  He sent her to the emergency room as  a direct admission with plans for CT scan of the abdomen and pelvis to rule  out acute appendicitis and surgery has been consulted.  The patient's last  known menstrual period was last week.   PAST MEDICAL HISTORY:  None.   PAST SURGICAL HISTORY:  Bilateral knee surgery and laparoscopic surgery for  ectopic pregnancy.   FAMILY HISTORY:  Noncontributory.   SOCIAL HISTORY:  No tobacco, no alcohol.   ALLERGIES:  No known drug allergies.   CURRENT MEDICATIONS:  None.   REVIEW OF SYSTEMS:  Again, no fevers or chills.  Symptoms only present for  24 hours.  No other complaints.   PHYSICAL EXAMINATION:  GENERAL APPEARANCE:  Pleasant female patient in no  acute distress, complaining of intermittent right lower quadrant abdominal  pain, worse with  activity.  VITAL SIGNS:  Temperature 97.6, blood pressure 118/78, pulse 72 and regular,  respirations 20.  NECK:  Supple, no adenopathy.  Thyroid nonpalpable.  CHEST:  Bilateral lung sounds are clear to auscultation.  Respiratory effort  nonlabored.  CARDIAC:  S1, S2, no rubs or gallops.  There is a soft systolic murmur left  sternal border.  ABDOMEN:  Soft, nondistended, mildly tender with minimal guarding in the  right lower quadrant. No rebound.  Bowel sounds are present.  EXTREMITIES:  Symmetrical in appearance without edema, cyanosis or clubbing.  NEUROLOGICAL:  Patient is alert and oriented x3, moving all extremities x4.   LABORATORY DATA/DIAGNOSTICS:  White blood cell count 2,900, hemoglobin 15,  platelet count 194,000.  Amylase 54. Urinalysis is negative.  CT scan of the  abdomen and pelvis has been done since arrival to the emergency room.  The  appendix was not well visualized but the radiologist appreciates no evidence  of appendicitis or irritable bowel disease.  There is fluid noted in the cul-  de-sac and right  adnexal area and the radiologist is suspicious for right  adnexal ovarian cyst rupture.   IMPRESSION:  Probable right ovarian cyst rupture.   PLAN OF ACTION:  Dr. Janee Morn has interviewed and examined the patient and  agrees with the following plan of care.  Will go ahead and discharge the  patient home, see no reason for admission.  Will give her Percocet for pain  and recommend she follow up with her gynecologist in one to two weeks and  Dr. Jonny Ruiz as previously discussed.      [name], N.P.      Gabrielle Dare Janee Morn, M.D.  Electronically Signed    ALE/MEDQ  D:  07/06/2005  T:  07/07/2005  Job:  [id number]   cc:   Corwin Levins, M.D. [facility]  [address]  [place]  [place] [address]

## 2010-08-09 ENCOUNTER — Ambulatory Visit (INDEPENDENT_AMBULATORY_CARE_PROVIDER_SITE_OTHER): Payer: BC Managed Care – PPO | Admitting: Internal Medicine

## 2010-08-09 ENCOUNTER — Encounter: Payer: Self-pay | Admitting: Internal Medicine

## 2010-08-09 VITALS — BP 102/62 | HR 72 | Temp 98.7°F | Ht 62.0 in | Wt 116.0 lb

## 2010-08-09 DIAGNOSIS — F988 Other specified behavioral and emotional disorders with onset usually occurring in childhood and adolescence: Secondary | ICD-10-CM

## 2010-08-09 DIAGNOSIS — J45909 Unspecified asthma, uncomplicated: Secondary | ICD-10-CM

## 2010-08-09 DIAGNOSIS — Z0000 Encounter for general adult medical examination without abnormal findings: Secondary | ICD-10-CM

## 2010-08-09 DIAGNOSIS — R42 Dizziness and giddiness: Secondary | ICD-10-CM

## 2010-08-09 DIAGNOSIS — J019 Acute sinusitis, unspecified: Secondary | ICD-10-CM | POA: Insufficient documentation

## 2010-08-09 MED ORDER — AMPHETAMINE-DEXTROAMPHET ER 20 MG PO CP24: 20.0000 mg | ORAL_CAPSULE | ORAL | Status: DC

## 2010-08-09 MED ORDER — MECLIZINE HCL 12.5 MG PO TABS: 12.5000 mg | ORAL_TABLET | Freq: Three times a day (TID) | ORAL | Status: AC | PRN

## 2010-08-09 MED ORDER — ALBUTEROL SULFATE HFA 108 (90 BASE) MCG/ACT IN AERS: 2.0000 | INHALATION_SPRAY | Freq: Four times a day (QID) | RESPIRATORY_TRACT | Status: DC | PRN

## 2010-08-09 MED ORDER — AZITHROMYCIN 250 MG PO TABS: ORAL_TABLET | ORAL | Status: AC

## 2010-08-09 MED ORDER — BUDESONIDE-FORMOTEROL FUMARATE 160-4.5 MCG/ACT IN AERO: 2.0000 | INHALATION_SPRAY | Freq: Two times a day (BID) | RESPIRATORY_TRACT | Status: DC

## 2010-08-09 NOTE — Assessment & Plan Note (Signed)
Mild, likely related to eustachian valve congestion right side, for antivert prn ,  to f/u any worsening symptoms or concerns

## 2010-08-09 NOTE — Assessment & Plan Note (Signed)
stable overall by hx and exam, most recent lab reviewed with pt, and pt to continue medical treatment as before , meds refilled today  SpO2 Readings from Last 3 Encounters:  08/09/10 97%  06/01/10 98%  06/03/09 97%

## 2010-08-09 NOTE — Assessment & Plan Note (Signed)
Mild to mod, for antibx course,  to f/u any worsening symptoms or concerns 

## 2010-08-09 NOTE — Patient Instructions (Addendum)
Take all new medications as prescribed Continue all other medications as before, including re-starting the adderall XR Please return about march 2013

## 2010-08-09 NOTE — Progress Notes (Signed)
Subjective:    Patient ID: Aimee Thompson, female    DOB: 10/23/1970, 40 y.o.   MRN: [id number]  HPI  Here with acute onset HA to right side face, head and neck;   Not better with advil, tylenol, caffeine adjustements;  Overall mild to mod, although can be some more severe and has some blurred vision during the day off and on; has some nasal congesiton but pain started prior per pt;  No n/v but has had some vertigo as well.  Had some low grade temp noted at 99.5 midwk last wk. No sT, cough, Pt denies chest pain, increased sob or doe, wheezing, orthopnea, PND, increased LE swelling, palpitations, dizziness or syncope except for above.  Pt denies new neurological symptoms such as new headache, or facial or extremity weakness or numbness   Pt denies polydipsia, polyuria.  Has hx of seasonal allergy, but usually mild as it was again this yr.   Also requests to re-start the aderall XR 20 mg she had been taking successfully prior for her ADD.  Daughter also with ADD, and both daughter and son sick with URI/pna last wk.  Past Medical History  Diagnosis Date  . ASTHMA 06/01/2010  . HEART MURMUR, HX OF 11/03/2006  . SINUSITIS- ACUTE-NOS 02/25/2009  . WEIGHT LOSS 06/03/2009  . Wheezing 06/03/2009   Past Surgical History  Procedure Date  . Laproscopy     reports that she has never smoked. She does not have any smokeless tobacco history on file. She reports that she drinks alcohol. She reports that she does not use illicit drugs. family history includes Hypertension in her other. Allergies  Allergen Reactions  . Penicillins     REACTION: hives   Current Outpatient Prescriptions on File Prior to Visit  Medication Sig Dispense Refill  . albuterol (PROAIR HFA) 108 (90 BASE) MCG/ACT inhaler Inhale 2 puffs into the lungs every 6 (six) hours as needed.        . budesonide-formoterol (SYMBICORT) 160-4.5 MCG/ACT inhaler Inhale 2 puffs into the lungs 2 (two) times daily.          Review of Systems Review of  Systems  Constitutional: Negative for diaphoresis and unexpected weight change.  HENT: Negative for drooling and tinnitus.   Eyes: Negative for photophobia and visual disturbance.  Respiratory: Negative for choking and stridor.   Gastrointestinal: Negative for vomiting and blood in stool.  Genitourinary: Negative for hematuria and decreased urine volume.  Musculoskeletal: Negative for gait problem.  Skin: Negative for color change and wound.  Neurological: Negative for tremors and numbness.  Psychiatric/Behavioral: Negative for decreased concentration. The patient is not hyperactive.       Objective:   Physical Exam BP 102/62  Pulse 72  Temp(Src) 98.7 F (37.1 C) (Oral)  Ht 5\' 2"  (1.575 m)  Wt 116 lb (52.617 kg)  BMI 21.22 kg/m2  SpO2 97%  LMP 08/02/2010 Physical Exam  VS noted, mild ill Constitutional: Pt appears well-developed and well-nourished.  HENT: Head: Normocephalic.  Bilat tm's mild erythema right > left.  Sinus marked right max tender.  Pharynx mild erythema Right Ear: External ear normal.  Left Ear: External ear normal.  Eyes: Conjunctivae and EOM are normal. Pupils are equal, round, and reactive to light.  Neck: Normal range of motion. Neck supple.  Cardiovascular: Normal rate and regular rhythm.   Pulmonary/Chest: Effort normal and breath sounds normal.  Abd:  Soft, NT, non-distended, + BS Neurological: Pt is alert. No cranial nerve deficit.  Skin: Skin is warm. No erythema.  Psychiatric: Pt behavior is normal. Thought content normal.         Assessment & Plan:

## 2010-08-09 NOTE — Assessment & Plan Note (Signed)
Mild uncontrolled - to re-start her adderal xr 20 - refills done today  Lab Results  Component Value Date   WBC 5.0 06/01/2010   HGB 13.3 06/01/2010   HCT 38.4 06/01/2010   PLT 228.0 06/01/2010   CHOL 219* 06/01/2010   TRIG 93.0 06/01/2010   HDL 91.30 06/01/2010   LDLDIRECT 110.7 06/01/2010   ALT 21 06/01/2010   AST 25 06/01/2010   NA 137 06/01/2010   K 3.8 06/01/2010   CL 103 06/01/2010   CREATININE 0.6 06/01/2010   BUN 13 06/01/2010   CO2 28 06/01/2010   TSH 1.52 06/01/2010

## 2010-11-24 ENCOUNTER — Telehealth: Payer: Self-pay

## 2010-11-24 MED ORDER — AMPHETAMINE-DEXTROAMPHET ER 20 MG PO CP24: 20.0000 mg | ORAL_CAPSULE | ORAL | Status: DC

## 2010-11-24 NOTE — Telephone Encounter (Signed)
Done hardcopy to dahlia/LIM B  

## 2010-11-24 NOTE — Telephone Encounter (Signed)
Patient is requesting a refill on Adderall 20 mg XR. She is requesting enough for 3 months filled like previous prescriptions. Please call when ready for pickup.

## 2010-11-25 NOTE — Telephone Encounter (Signed)
Called patient left message that prescriptions requested are ready for pickup at the front desk

## 2010-11-25 NOTE — Telephone Encounter (Signed)
Called patient left message to pickup prescriptions requested at front desk.

## 2011-04-14 ENCOUNTER — Other Ambulatory Visit: Payer: Self-pay | Admitting: *Deleted

## 2011-04-14 ENCOUNTER — Other Ambulatory Visit: Payer: Self-pay | Admitting: Endocrinology

## 2011-04-14 MED ORDER — AMPHETAMINE-DEXTROAMPHET ER 20 MG PO CP24: 20.0000 mg | ORAL_CAPSULE | ORAL | Status: DC

## 2011-04-14 NOTE — Telephone Encounter (Signed)
Pt is requesting refill of Adderall 20 mg XR-requesing 3 month supply-JWJ pt, please advise.

## 2011-04-14 NOTE — Telephone Encounter (Signed)
i printed 

## 2011-04-14 NOTE — Telephone Encounter (Signed)
Left message on VM informing pt rx ready for pickup-rx placed upfront in cabinet.

## 2011-05-11 ENCOUNTER — Ambulatory Visit (INDEPENDENT_AMBULATORY_CARE_PROVIDER_SITE_OTHER): Payer: BC Managed Care – PPO | Admitting: Internal Medicine

## 2011-05-11 ENCOUNTER — Encounter: Payer: Self-pay | Admitting: Internal Medicine

## 2011-05-11 VITALS — BP 112/70 | HR 80 | Temp 97.2°F | Ht 62.0 in | Wt 115.0 lb

## 2011-05-11 DIAGNOSIS — F988 Other specified behavioral and emotional disorders with onset usually occurring in childhood and adolescence: Secondary | ICD-10-CM

## 2011-05-11 DIAGNOSIS — J019 Acute sinusitis, unspecified: Secondary | ICD-10-CM

## 2011-05-11 DIAGNOSIS — Z0000 Encounter for general adult medical examination without abnormal findings: Secondary | ICD-10-CM

## 2011-05-11 DIAGNOSIS — J45909 Unspecified asthma, uncomplicated: Secondary | ICD-10-CM

## 2011-05-11 MED ORDER — LEVOFLOXACIN 250 MG PO TABS: 250.0000 mg | ORAL_TABLET | Freq: Every day | ORAL | Status: AC

## 2011-05-11 NOTE — Assessment & Plan Note (Signed)
Mild to mod, for antibx course,  to f/u any worsening symptoms or concerns 

## 2011-05-11 NOTE — Progress Notes (Signed)
  Subjective:    Patient ID: Aimee Thompson, female    DOB: May 18, 1970, 41 y.o.   MRN: [id number]  HPI   Here with 2 wks acute onset fever, facial pain, pressure, general weakness and malaise, and greenish d/c, with slight ST, but little to no cough and Pt denies chest pain, increased sob or doe, wheezing, orthopnea, PND, increased LE swelling, palpitations, dizziness or syncope.  Pt denies new neurological symptoms such as new headache, or facial or extremity weakness or numbness   Pt denies polydipsia, polyuria.  ADD meds working well, has good attention, task completion,  Denies worsening depressive symptoms, suicidal ideation, or panic, though has ongoing anxiety, not increased recently.  Past Medical History  Diagnosis Date  . ASTHMA 06/01/2010  . HEART MURMUR, HX OF 11/03/2006  . SINUSITIS- ACUTE-NOS 02/25/2009  . WEIGHT LOSS 06/03/2009  . Wheezing 06/03/2009  . ADD (attention deficit disorder) 08/09/2010   Past Surgical History  Procedure Date  . Laproscopy     reports that she has never smoked. She does not have any smokeless tobacco history on file. She reports that she drinks alcohol. She reports that she does not use illicit drugs. family history includes Hypertension in her other. Allergies  Allergen Reactions  . Penicillins     REACTION: hives   Current Outpatient Prescriptions on File Prior to Visit  Medication Sig Dispense Refill  . albuterol (PROAIR HFA) 108 (90 BASE) MCG/ACT inhaler Inhale 2 puffs into the lungs every 6 (six) hours as needed.  1 Inhaler  5  . amphetamine-dextroamphetamine (ADDERALL XR, 20MG ,) 20 MG 24 hr capsule Take 1 capsule (20 mg total) by mouth every morning.  30 capsule  0  . budesonide-formoterol (SYMBICORT) 160-4.5 MCG/ACT inhaler Inhale 2 puffs into the lungs 2 (two) times daily.  1 Inhaler  5  . meclizine (ANTIVERT) 12.5 MG tablet Take 1 tablet (12.5 mg total) by mouth 3 (three) times daily as needed. For dizziness  30 tablet  1    Review of  Systems Review of Systems  Constitutional: Negative for diaphoresis and unexpected weight change.  HENT: Negative for drooling and tinnitus.   Eyes: Negative for photophobia and visual disturbance.  Respiratory: Negative for choking and stridor.   Gastrointestinal: Negative for vomiting and blood in stool.  Genitourinary: Negative for hematuria and decreased urine volume.  Musculoskeletal: Negative for gait problem.  Skin: Negative for color change and wound.  Neurological: Negative for tremors and numbness.  Psychiatric/Behavioral: Negative for decreased concentration. The patient is not hyperactive.       Objective:   Physical Exam BP 112/70  Pulse 80  Temp(Src) 97.2 F (36.2 C) (Oral)  Ht 5\' 2"  (1.575 m)  Wt 115 lb (52.164 kg)  BMI 21.03 kg/m2  SpO2 97% Physical Exam  VS noted, mild ill Constitutional: Pt appears well-developed and well-nourished.  HENT: Head: Normocephalic.  Right Ear: External ear normal.  Left Ear: External ear normal.  Bilat tm's mild erythema.  Sinus tender bilat.  Pharynx mild erythema Eyes: Conjunctivae and EOM are normal. Pupils are equal, round, and reactive to light.  Neck: Normal range of motion. Neck supple.  Cardiovascular: Normal rate and regular rhythm.   Pulmonary/Chest: Effort normal and breath sounds normal.  Neurological: Pt is alert. No cranial nerve deficit.  Skin: Skin is warm. No erythema.  Psychiatric: Pt behavior is normal. Thought content normal. not nervous or depressed affect     Assessment & Plan:

## 2011-05-11 NOTE — Assessment & Plan Note (Signed)
stable overall by hx and exam, most recent data reviewed with pt, and pt to continue medical treatment as before Lab Results  Component Value Date   WBC 5.0 06/01/2010   HGB 13.3 06/01/2010   HCT 38.4 06/01/2010   PLT 228.0 06/01/2010   GLUCOSE 92 06/01/2010   CHOL 219* 06/01/2010   TRIG 93.0 06/01/2010   HDL 91.30 06/01/2010   LDLDIRECT 110.7 06/01/2010   ALT 21 06/01/2010   AST 25 06/01/2010   NA 137 06/01/2010   K 3.8 06/01/2010   CL 103 06/01/2010   CREATININE 0.6 06/01/2010   BUN 13 06/01/2010   CO2 28 06/01/2010   TSH 1.52 06/01/2010    

## 2011-05-11 NOTE — Patient Instructions (Addendum)
Take all new medications as prescribed Continue all other medications as before You can also take Delsym OTC for cough, and/or Mucinex (or it's generic off brand) for congestion Please return in 1 year for your yearly visit, or sooner if needed, with Lab testing done 3-5 days before

## 2011-05-11 NOTE — Assessment & Plan Note (Signed)
stable overall by hx and exam, most recent data reviewed with pt, and pt to continue medical treatment as before  SpO2 Readings from Last 3 Encounters:  05/11/11 97%  08/09/10 97%  06/01/10 98%

## 2011-07-11 ENCOUNTER — Ambulatory Visit (INDEPENDENT_AMBULATORY_CARE_PROVIDER_SITE_OTHER): Payer: BC Managed Care – PPO | Admitting: Internal Medicine

## 2011-07-11 ENCOUNTER — Encounter: Payer: Self-pay | Admitting: Internal Medicine

## 2011-07-11 VITALS — BP 112/80 | HR 78 | Temp 99.4°F | Ht 62.5 in | Wt 112.2 lb

## 2011-07-11 DIAGNOSIS — R634 Abnormal weight loss: Secondary | ICD-10-CM

## 2011-07-11 DIAGNOSIS — F988 Other specified behavioral and emotional disorders with onset usually occurring in childhood and adolescence: Secondary | ICD-10-CM

## 2011-07-11 DIAGNOSIS — F32A Depression, unspecified: Secondary | ICD-10-CM | POA: Insufficient documentation

## 2011-07-11 DIAGNOSIS — F329 Major depressive disorder, single episode, unspecified: Secondary | ICD-10-CM

## 2011-07-11 MED ORDER — AMPHETAMINE-DEXTROAMPHET ER 20 MG PO CP24: 20.0000 mg | ORAL_CAPSULE | ORAL | Status: DC

## 2011-07-11 MED ORDER — ESCITALOPRAM OXALATE 10 MG PO TABS: 10.0000 mg | ORAL_TABLET | Freq: Every day | ORAL | Status: DC

## 2011-07-11 NOTE — Progress Notes (Signed)
Subjective:    Patient ID: Aimee Thompson, female    DOB: 18-Mar-1971, 41 y.o.   MRN: [id number]  HPI  Here with c/o 3-4 wks acute mild to mod depressive symtpoms, with anhedonia, not looking forward, down most days, irritable adn affecting social work function;  Denies suicidal ideation, or panic.  Does think counseling would be benficial, Pt denies chest pain, increased sob or doe, wheezing, orthopnea, PND, increased LE swelling, palpitations, dizziness or syncope.  Pt denies new neurological symptoms such as new headache, or facial or extremity weakness or numbness  Concentraion a bit off, and needs adderall refills, which o/w seems to be working well.  No recent unusual wt loss. Past Medical History  Diagnosis Date  . ASTHMA 06/01/2010  . HEART MURMUR, HX OF 11/03/2006  . SINUSITIS- ACUTE-NOS 02/25/2009  . WEIGHT LOSS 06/03/2009  . Wheezing 06/03/2009  . ADD (attention deficit disorder) 08/09/2010   Past Surgical History  Procedure Date  . Laproscopy     reports that she has never smoked. She does not have any smokeless tobacco history on file. She reports that she drinks alcohol. She reports that she does not use illicit drugs. family history includes Hypertension in her other. Allergies  Allergen Reactions  . Penicillins     REACTION: hives   Current Outpatient Prescriptions on File Prior to Visit  Medication Sig Dispense Refill  . albuterol (PROAIR HFA) 108 (90 BASE) MCG/ACT inhaler Inhale 2 puffs into the lungs every 6 (six) hours as needed.  1 Inhaler  5  . budesonide-formoterol (SYMBICORT) 160-4.5 MCG/ACT inhaler Inhale 2 puffs into the lungs 2 (two) times daily.  1 Inhaler  5  . amphetamine-dextroamphetamine (ADDERALL XR, 20MG ,) 20 MG 24 hr capsule Take 1 capsule (20 mg total) by mouth every morning. To fill October 08, 2010  30 capsule  0  . escitalopram (LEXAPRO) 10 MG tablet Take 1 tablet (10 mg total) by mouth daily.  90 tablet  3  . meclizine (ANTIVERT) 12.5 MG tablet Take 1  tablet (12.5 mg total) by mouth 3 (three) times daily as needed. For dizziness  30 tablet  1  . DISCONTD: amphetamine-dextroamphetamine (ADDERALL XR, 20MG ,) 20 MG 24 hr capsule Take 1 capsule (20 mg total) by mouth every morning.  30 capsule  0   Review of Systems Review of Systems  Constitutional: Negative for diaphoresis and unexpected weight change.  HENT: Negative for drooling and tinnitus.   Eyes: Negative for photophobia and visual disturbance.  Respiratory: Negative for choking and stridor.   Gastrointestinal: Negative for vomiting and blood in stool.  Genitourinary: Negative for hematuria and decreased urine volume.  Musculoskeletal: Negative for gait problem.  Skin: Negative for color change and wound.  Neurological: Negative for tremors and numbness.  Psychiatric/Behavioral: Negative for decreased concentration. The patient is not hyperactive.       Objective:   Physical Exam BP 112/80  Pulse 78  Temp(Src) 99.4 F (37.4 C) (Oral)  Ht 5' 2.5" (1.588 m)  Wt 112 lb 4 oz (50.916 kg)  BMI 20.20 kg/m2  SpO2 99% Physical Exam  VS noted Constitutional: Pt appears well-developed and well-nourished.  HENT: Head: Normocephalic.  Right Ear: External ear normal.  Left Ear: External ear normal.  Eyes: Conjunctivae and EOM are normal. Pupils are equal, round, and reactive to light.  Neck: Normal range of motion. Neck supple.  Cardiovascular: Normal rate and regular rhythm.   Pulmonary/Chest: Effort normal and breath sounds normal.  Neurological: Pt is  alert.   Skin: Skin is warm. No erythema. No rash Psychiatric: Pt behavior is normal. Thought content normal. + depressed affect, mild nervous    Assessment & Plan:

## 2011-07-11 NOTE — Assessment & Plan Note (Addendum)
Lost 3 lbs since feb 2013 but stable overall by hx and exam, most recent data reviewed with pt, and pt to continue medical treatment as before Lab Results  Component Value Date   WBC 5.0 06/01/2010   HGB 13.3 06/01/2010   HCT 38.4 06/01/2010   PLT 228.0 06/01/2010   GLUCOSE 92 06/01/2010   CHOL 219* 06/01/2010   TRIG 93.0 06/01/2010   HDL 91.30 06/01/2010   LDLDIRECT 110.7 06/01/2010   ALT 21 06/01/2010   AST 25 06/01/2010   NA 137 06/01/2010   K 3.8 06/01/2010   CL 103 06/01/2010   CREATININE 0.6 06/01/2010   BUN 13 06/01/2010   CO2 28 06/01/2010   TSH 1.52 06/01/2010

## 2011-07-11 NOTE — Assessment & Plan Note (Signed)
New onset, mild, for lexapro 10 qd, verified nonsuicidal, pt reqeusts counsleing as well - will refer

## 2011-07-11 NOTE — Assessment & Plan Note (Signed)
stable overall by hx and exam,, and pt to continue medical treatment as before  For refills today

## 2011-07-11 NOTE — Patient Instructions (Addendum)
Take all new medications as prescribed - the lexapro at 10 mg per day Please consider Half dosing if you have obvious sluggishness over multiple days worse than before the medication You will be contacted regarding the referral for: psychology - counseling You are given the adderall refills as well Please return in Feb 2013 for your yearly visit, or sooner if needed, with Lab testing done 3-5 days before

## 2011-07-12 ENCOUNTER — Other Ambulatory Visit: Payer: Self-pay

## 2011-07-19 ENCOUNTER — Ambulatory Visit (INDEPENDENT_AMBULATORY_CARE_PROVIDER_SITE_OTHER): Payer: BC Managed Care – PPO | Admitting: Professional

## 2011-07-19 DIAGNOSIS — F4323 Adjustment disorder with mixed anxiety and depressed mood: Secondary | ICD-10-CM

## 2011-07-22 ENCOUNTER — Telehealth: Payer: Self-pay

## 2011-07-22 MED ORDER — ALPRAZOLAM 0.25 MG PO TABS: 0.2500 mg | ORAL_TABLET | Freq: Two times a day (BID) | ORAL | Status: DC | PRN

## 2011-07-22 NOTE — Telephone Encounter (Signed)
Please try the lower strength first, I think should work ok at this strength, to use only PRN  Done hardcopy to [employer]

## 2011-07-22 NOTE — Telephone Encounter (Signed)
Called the patient informed of MD's instructions on medication and faxed hardcopy to pharmacy.

## 2011-07-22 NOTE — Telephone Encounter (Signed)
Pt called stating that Lexapro is working well and she has meet with counselor but she thinks she would like Rx for Xanax 0.5mg . Per pt, this options was discussed at last OV.

## 2011-07-26 ENCOUNTER — Ambulatory Visit: Payer: BC Managed Care – PPO | Admitting: Professional

## 2011-08-02 ENCOUNTER — Ambulatory Visit: Payer: BC Managed Care – PPO | Admitting: Professional

## 2011-08-09 ENCOUNTER — Ambulatory Visit (INDEPENDENT_AMBULATORY_CARE_PROVIDER_SITE_OTHER): Payer: BC Managed Care – PPO | Admitting: Professional

## 2011-08-09 DIAGNOSIS — F4323 Adjustment disorder with mixed anxiety and depressed mood: Secondary | ICD-10-CM

## 2011-08-12 ENCOUNTER — Other Ambulatory Visit: Payer: Self-pay | Admitting: Internal Medicine

## 2011-08-30 ENCOUNTER — Ambulatory Visit: Payer: BC Managed Care – PPO | Admitting: Professional

## 2011-09-27 ENCOUNTER — Ambulatory Visit (INDEPENDENT_AMBULATORY_CARE_PROVIDER_SITE_OTHER): Payer: BC Managed Care – PPO | Admitting: Professional

## 2011-09-27 DIAGNOSIS — F4323 Adjustment disorder with mixed anxiety and depressed mood: Secondary | ICD-10-CM

## 2011-10-24 ENCOUNTER — Other Ambulatory Visit: Payer: Self-pay | Admitting: Internal Medicine

## 2011-10-25 ENCOUNTER — Telehealth: Payer: Self-pay

## 2011-10-25 NOTE — Telephone Encounter (Signed)
Already addressed

## 2011-10-25 NOTE — Telephone Encounter (Signed)
Pharmacy requesting refill on alprazolam 0.25 mg bid for sleep. Please advise

## 2011-10-25 NOTE — Telephone Encounter (Signed)
Faxed hardcopy to pharmacy. 

## 2011-10-25 NOTE — Telephone Encounter (Signed)
Done hardcopy to robin  

## 2011-11-10 ENCOUNTER — Ambulatory Visit (INDEPENDENT_AMBULATORY_CARE_PROVIDER_SITE_OTHER): Payer: BC Managed Care – PPO | Admitting: Internal Medicine

## 2011-11-10 ENCOUNTER — Encounter: Payer: Self-pay | Admitting: Internal Medicine

## 2011-11-10 VITALS — BP 112/70 | HR 87 | Temp 98.6°F | Ht 62.5 in | Wt 119.0 lb

## 2011-11-10 DIAGNOSIS — J029 Acute pharyngitis, unspecified: Secondary | ICD-10-CM

## 2011-11-10 DIAGNOSIS — F329 Major depressive disorder, single episode, unspecified: Secondary | ICD-10-CM

## 2011-11-10 DIAGNOSIS — J45909 Unspecified asthma, uncomplicated: Secondary | ICD-10-CM

## 2011-11-10 DIAGNOSIS — F32A Depression, unspecified: Secondary | ICD-10-CM

## 2011-11-10 DIAGNOSIS — F988 Other specified behavioral and emotional disorders with onset usually occurring in childhood and adolescence: Secondary | ICD-10-CM

## 2011-11-10 MED ORDER — AMPHETAMINE-DEXTROAMPHET ER 25 MG PO CP24: 25.0000 mg | ORAL_CAPSULE | ORAL | Status: DC

## 2011-11-10 MED ORDER — ESCITALOPRAM OXALATE 10 MG PO TABS: 10.0000 mg | ORAL_TABLET | Freq: Every day | ORAL | Status: DC

## 2011-11-10 MED ORDER — CLARITHROMYCIN 250 MG PO TABS: 250.0000 mg | ORAL_TABLET | Freq: Two times a day (BID) | ORAL | Status: AC

## 2011-11-10 NOTE — Assessment & Plan Note (Signed)
stable overall by hx and exam, most recent data reviewed with pt, and pt to continue medical treatment as before SpO2 Readings from Last 3 Encounters:  11/10/11 96%  07/11/11 99%  05/11/11 97%

## 2011-11-10 NOTE — Assessment & Plan Note (Signed)
stable overall by hx and exam, and pt to continue medical treatment as before, refills done today   

## 2011-11-10 NOTE — Progress Notes (Signed)
Subjective:    Patient ID: Aimee Thompson, female    DOB: 03-10-1971, 41 y.o.   MRN: [id number]  HPI  Here with 2 days onset severe ST with neck anterior discomfort, with general weakness and malaise, fever, HA but no ear pain, sinus pain, cough and Pt denies chest pain, increased sob or doe, wheezing, orthopnea, PND, increased LE swelling, palpitations, dizziness or syncope.  Pt denies new neurological symptoms such as new headache, or facial or extremity weakness or numbness   Pt denies polydipsia, polyuria.  Denies worsening depressive symptoms, suicidal ideation, or panic, though has ongoing anxiety, not increased recently.   Adderall working ok but requests small increase to 25 mg , daughter doing well on this.   Past Medical History  Diagnosis Date  . ASTHMA 06/01/2010  . HEART MURMUR, HX OF 11/03/2006  . SINUSITIS- ACUTE-NOS 02/25/2009  . WEIGHT LOSS 06/03/2009  . Wheezing 06/03/2009  . ADD (attention deficit disorder) 08/09/2010   Past Surgical History  Procedure Date  . Laproscopy     reports that she has never smoked. She does not have any smokeless tobacco history on file. She reports that she drinks alcohol. She reports that she does not use illicit drugs. family history includes Hypertension in her other. Allergies  Allergen Reactions  . Penicillins     REACTION: hives   Current Outpatient Prescriptions on File Prior to Visit  Medication Sig Dispense Refill  . albuterol (PROAIR HFA) 108 (90 BASE) MCG/ACT inhaler Inhale 2 puffs into the lungs every 6 (six) hours as needed.  1 Inhaler  5  . ALPRAZolam (XANAX) 0.25 MG tablet Take 1 tablet (0.25 mg total) by mouth 2 (two) times daily as needed.  60 tablet  2  . SYMBICORT 160-4.5 MCG/ACT inhaler USE TWO PUFFS TWO TIMES A DAY AS DIRECTED  10.2 g  4  . DISCONTD: amphetamine-dextroamphetamine (ADDERALL XR, 20MG ,) 20 MG 24 hr capsule Take 1 capsule (20 mg total) by mouth every morning. To fill October 08, 2010  30 capsule  0  .  DISCONTD: amphetamine-dextroamphetamine (ADDERALL XR, 20MG ,) 20 MG 24 hr capsule Take 1 capsule (20 mg total) by mouth every morning. To fill September 09, 2011  30 capsule  0  . DISCONTD: escitalopram (LEXAPRO) 10 MG tablet Take 1 tablet (10 mg total) by mouth daily.  90 tablet  3   Review of Systems Constitutional: Negative for diaphoresis and unexpected weight change.  HENT: Negative for drooling and tinnitus.   Eyes: Negative for photophobia and visual disturbance.  Respiratory: Negative for choking and stridor.   Gastrointestinal: Negative for vomiting and blood in stool.  Genitourinary: Negative for hematuria and decreased urine volume.  Musculoskeletal: Negative for gait problem.  Skin: Negative for color change and wound.  Psychiatric/Behavioral: Negative for decreased concentration. The patient is not hyperactive.      Objective:   Physical Exam BP 112/70  Pulse 87  Temp 98.6 F (37 C) (Oral)  Ht 5' 2.5" (1.588 m)  Wt 119 lb (53.978 kg)  BMI 21.42 kg/m2  SpO2 96% Physical Exam  VS noted, mild ill Constitutional: Pt appears well-developed and well-nourished.  HENT: Head: Normocephalic.  Right Ear: External ear normal.  Left Ear: External ear normal.  Bilat tm's mild erythema.  Sinus nontender.  Pharynx severe erythema, with exudate Eyes: Conjunctivae and EOM are normal. Pupils are equal, round, and reactive to light.  Neck: Normal range of motion. Neck supple.  Cardiovascular: Normal rate and regular rhythm.  Pulmonary/Chest: Effort normal and breath sounds normal.  Neurological: Pt is alert. Not confused Skin: Skin is warm. No erythema. No rash Psychiatric: Pt behavior is normal. Thought content normal. Not nervous or depressed affect    Assessment & Plan:

## 2011-11-10 NOTE — Assessment & Plan Note (Signed)
stable overall by hx and exam, most recent data reviewed with pt, and pt to continue medical treatment as before Lab Results  Component Value Date   WBC 5.0 06/01/2010   HGB 13.3 06/01/2010   HCT 38.4 06/01/2010   PLT 228.0 06/01/2010   GLUCOSE 92 06/01/2010   CHOL 219* 06/01/2010   TRIG 93.0 06/01/2010   HDL 91.30 06/01/2010   LDLDIRECT 110.7 06/01/2010   ALT 21 06/01/2010   AST 25 06/01/2010   NA 137 06/01/2010   K 3.8 06/01/2010   CL 103 06/01/2010   CREATININE 0.6 06/01/2010   BUN 13 06/01/2010   CO2 28 06/01/2010   TSH 1.52 06/01/2010

## 2011-11-10 NOTE — Patient Instructions (Addendum)
Take all new medications as prescribed Continue all other medications as before You can also take Delsym OTC for cough, and/or Mucinex (or it's generic off brand) for congestion Continue all other medications as before You are given the adderall refills at 25 mg today Please have the pharmacy call with any refills you may need.

## 2011-11-10 NOTE — Assessment & Plan Note (Signed)
Mild to mod, for antibx course,  to f/u any worsening symptoms or concerns 

## 2011-12-14 ENCOUNTER — Other Ambulatory Visit: Payer: Self-pay | Admitting: Obstetrics and Gynecology

## 2011-12-14 DIAGNOSIS — Z1231 Encounter for screening mammogram for malignant neoplasm of breast: Secondary | ICD-10-CM

## 2011-12-15 ENCOUNTER — Telehealth: Payer: Self-pay

## 2011-12-15 NOTE — Telephone Encounter (Signed)
Pt called requesting an increase in Lexapro from 10 mg QD to 20 mg QD, please advise.

## 2011-12-22 ENCOUNTER — Encounter: Payer: Self-pay | Admitting: Internal Medicine

## 2011-12-22 ENCOUNTER — Ambulatory Visit (INDEPENDENT_AMBULATORY_CARE_PROVIDER_SITE_OTHER): Payer: BC Managed Care – PPO | Admitting: Internal Medicine

## 2011-12-22 ENCOUNTER — Other Ambulatory Visit (INDEPENDENT_AMBULATORY_CARE_PROVIDER_SITE_OTHER): Payer: BC Managed Care – PPO

## 2011-12-22 VITALS — BP 128/80 | HR 68 | Temp 98.6°F

## 2011-12-22 DIAGNOSIS — R590 Localized enlarged lymph nodes: Secondary | ICD-10-CM

## 2011-12-22 DIAGNOSIS — R5383 Other fatigue: Secondary | ICD-10-CM

## 2011-12-22 DIAGNOSIS — R599 Enlarged lymph nodes, unspecified: Secondary | ICD-10-CM

## 2011-12-22 DIAGNOSIS — R5381 Other malaise: Secondary | ICD-10-CM

## 2011-12-22 DIAGNOSIS — R61 Generalized hyperhidrosis: Secondary | ICD-10-CM

## 2011-12-22 LAB — TSH: TSH: 1.45 u[IU]/mL (ref 0.35–5.50)

## 2011-12-22 LAB — CBC WITH DIFFERENTIAL/PLATELET
Basophils Relative: 0.4 % (ref 0.0–3.0)
Eosinophils Relative: 2 % (ref 0.0–5.0)
HCT: 39 % (ref 36.0–46.0)
Hemoglobin: 13.1 g/dL (ref 12.0–15.0)
Lymphs Abs: 1 10*3/uL (ref 0.7–4.0)
MCV: 99.6 fl (ref 78.0–100.0)
Monocytes Relative: 5.7 % (ref 3.0–12.0)
Neutro Abs: 5.4 10*3/uL (ref 1.4–7.7)
WBC: 7 10*3/uL (ref 4.5–10.5)

## 2011-12-22 LAB — BASIC METABOLIC PANEL
Chloride: 102 mEq/L (ref 96–112)
GFR: 82.59 mL/min (ref >60.00)
Glucose, Bld: 107 mg/dL — ABNORMAL HIGH (ref 70–99)
Potassium: 4.1 mEq/L (ref 3.5–5.1)
Sodium: 137 mEq/L (ref 135–145)

## 2011-12-22 LAB — HEPATIC FUNCTION PANEL
ALT: 19 U/L (ref 0–35)
Total Bilirubin: 0.9 mg/dL (ref 0.3–1.2)
Total Protein: 7 g/dL (ref 6.0–8.3)

## 2011-12-22 NOTE — Progress Notes (Signed)
Subjective:    Patient ID: Aimee Thompson, female    DOB: [birth year], 41 y.o.   MRN: [id number]  HPI Complains of fatigue Reports associated night sweats, swollen glands and general malaise Denies recent travel or exposure to known sick contacts Onset > 1 month ago, gradually progressive symptoms Evaluation of gynecologist unremarkable for early menopause per patient report No change in medications Denies increase or change in chronic depression, anxiety and ADD symptoms  Past Medical History  Diagnosis Date  . ASTHMA   . HEART MURMUR, HX OF   . ADD (attention deficit disorder)     Review of Systems  Constitutional: Positive for fatigue. Negative for fever, activity change, appetite change and unexpected weight change.  HENT: Negative for congestion, dental problem and sinus pressure.   Eyes: Negative for pain and visual disturbance.  Respiratory: Negative for cough, chest tightness and shortness of breath.   Cardiovascular: Negative for chest pain and palpitations.  Gastrointestinal: Negative for nausea, vomiting, diarrhea and constipation.  Musculoskeletal: Positive for myalgias. Negative for back pain and joint swelling.  Skin: Negative for rash and wound.  Neurological: Negative for dizziness, seizures, weakness and headaches.  Hematological: Positive for adenopathy.  Psychiatric/Behavioral: Negative for hallucinations, confusion, disturbed wake/sleep cycle, dysphoric mood and decreased concentration. The patient is not nervous/anxious.        Objective:   Physical Exam BP 128/80  Pulse 68  Temp 98.6 F (37 C) (Oral)  SpO2 96%  LMP 12/15/2011 Wt Readings from Last 3 Encounters:  11/10/11 119 lb (53.978 kg)  07/11/11 112 lb 4 oz (50.916 kg)  05/11/11 115 lb (52.164 kg)   Constitutional: She appears well-developed and well-nourished. No distress.  HENT: Head: Normocephalic and atraumatic. Ears: B TMs ok, no erythema or effusion; Nose: Nose normal. Mouth/Throat:  Oropharynx is clear and moist. No oropharyngeal exudate.  Eyes: Conjunctivae and EOM are normal. Pupils are equal, round, and reactive to light. No scleral icterus.  Neck: Right-sided nontender lymphadenopathy- Normal range of motion. Neck supple. No JVD present. No thyromegaly or nodules present.  Cardiovascular: Normal rate, regular rhythm and normal heart sounds.  No murmur heard. No BLE edema. Pulmonary/Chest: Effort normal and breath sounds normal. No respiratory distress. She has no wheezes.  Musculoskeletal: Normal range of motion, no joint effusions. No gross deformities Neurological: She is alert and oriented to person, place, and time. No cranial nerve deficit. Coordination normal.  Skin: Skin is warm and dry. No rash noted. No erythema.  Psychiatric: She has a normal mood and affect. Her behavior is normal. Judgment and thought content normal.   Lab Results  Component Value Date   WBC 5.0 06/01/2010   HGB 13.3 06/01/2010   HCT 38.4 06/01/2010   PLT 228.0 06/01/2010   GLUCOSE 92 06/01/2010   CHOL 219* 06/01/2010   TRIG 93.0 06/01/2010   HDL 91.30 06/01/2010   LDLDIRECT 110.7 06/01/2010   ALT 21 06/01/2010   AST 25 06/01/2010   NA 137 06/01/2010   K 3.8 06/01/2010   CL 103 06/01/2010   CREATININE 0.6 06/01/2010   BUN 13 06/01/2010   CO2 28 06/01/2010   TSH 1.52 06/01/2010       Assessment & Plan:   Fatigue - physical rather than motivational associated with nonspecific symptoms including R neck LAD, night sweats -  check screening labs including EBV Arrange ultrasound of neck to evaluate right-sided lymphadenopathy  No treatment changes recommended this time, further treatment and evaluation to depend on results Patient  encouraged to followup even if results appear negative to continue following symptoms and findings as needed

## 2011-12-22 NOTE — Patient Instructions (Signed)
It was good to see you today. Medications reviewed, no changes at this time. Test(s) ordered today. Your results will be released to MyChart (or called to you) after review, usually within 72hours after test completion. If any changes need to be made, you will be notified at that same time. we'll make referral for ultrasound of your neck to evaluate for lymph node swelling. Our office will contact you regarding appointment(s) once made. Further treatment or testing will depend on these results Followup in 3 months to review symptoms, call sooner if worse or unimproved

## 2011-12-23 ENCOUNTER — Ambulatory Visit
Admission: RE | Admit: 2011-12-23 | Discharge: 2011-12-23 | Disposition: A | Discharge status: Home / Self Care | Payer: BC Managed Care – PPO | Source: Ambulatory Visit | Attending: Internal Medicine | Admitting: Internal Medicine

## 2011-12-23 DIAGNOSIS — R5383 Other fatigue: Secondary | ICD-10-CM

## 2011-12-23 DIAGNOSIS — R590 Localized enlarged lymph nodes: Secondary | ICD-10-CM

## 2011-12-23 DIAGNOSIS — R61 Generalized hyperhidrosis: Secondary | ICD-10-CM

## 2011-12-23 LAB — EPSTEIN-BARR VIRUS VCA ANTIBODY PANEL: EBV NA IgG: >600 U/mL — ABNORMAL HIGH (ref <18.0)

## 2011-12-26 ENCOUNTER — Encounter: Payer: Self-pay | Admitting: Internal Medicine

## 2012-01-06 ENCOUNTER — Ambulatory Visit
Admission: RE | Admit: 2012-01-06 | Discharge: 2012-01-06 | Disposition: A | Discharge status: Home / Self Care | Payer: BC Managed Care – PPO | Source: Ambulatory Visit | Attending: Obstetrics and Gynecology | Admitting: Obstetrics and Gynecology

## 2012-01-06 DIAGNOSIS — Z1231 Encounter for screening mammogram for malignant neoplasm of breast: Secondary | ICD-10-CM

## 2012-01-09 ENCOUNTER — Ambulatory Visit: Payer: BC Managed Care – PPO

## 2012-01-31 ENCOUNTER — Telehealth: Payer: Self-pay

## 2012-01-31 MED ORDER — CITALOPRAM HYDROBROMIDE 10 MG PO TABS: 10.0000 mg | ORAL_TABLET | Freq: Every day | ORAL | Status: DC

## 2012-01-31 MED ORDER — AMPHETAMINE-DEXTROAMPHET ER 25 MG PO CP24: 25.0000 mg | ORAL_CAPSULE | ORAL | Status: DC

## 2012-01-31 NOTE — Telephone Encounter (Signed)
Called the patient informed of change and that hardcopy of adderall is ready for pickup at the front desk.

## 2012-01-31 NOTE — Telephone Encounter (Signed)
adderall - Done hardcopy to robin  Ok to change lexapro to citalopram 10 (milder med)

## 2012-01-31 NOTE — Telephone Encounter (Signed)
The patient would like a refill on adderall.  She also would like an alternative for lexapro.  Since taking lexapro she has had a decreased libido and also thinks alcohol has an affect on lexapro.  Please advise

## 2012-03-30 ENCOUNTER — Telehealth: Payer: Self-pay | Admitting: Internal Medicine

## 2012-03-30 MED ORDER — AMPHETAMINE-DEXTROAMPHET ER 25 MG PO CP24: 25.0000 mg | ORAL_CAPSULE | ORAL | Status: DC

## 2012-03-30 NOTE — Telephone Encounter (Signed)
Patient is requesting 3 month supply for adderall XR 25mg , call when ready for pick up

## 2012-03-30 NOTE — Telephone Encounter (Signed)
Done hardcopy to robin - total 3 mo 

## 2012-04-02 NOTE — Telephone Encounter (Signed)
Called the patient informed hardcopy's are ready for pickup at the front desk. 

## 2012-05-05 ENCOUNTER — Other Ambulatory Visit: Payer: Self-pay

## 2012-06-01 ENCOUNTER — Other Ambulatory Visit: Payer: Self-pay

## 2012-06-01 MED ORDER — ALPRAZOLAM 0.25 MG PO TABS: 0.2500 mg | ORAL_TABLET | Freq: Two times a day (BID) | ORAL | Status: DC | PRN

## 2012-06-01 NOTE — Telephone Encounter (Signed)
Done hardcopy to robin  

## 2012-06-01 NOTE — Telephone Encounter (Signed)
Faxed hardcopy to pharmacy. 

## 2012-07-10 ENCOUNTER — Other Ambulatory Visit: Payer: Self-pay

## 2012-07-10 MED ORDER — AMPHETAMINE-DEXTROAMPHET ER 25 MG PO CP24: 25.0000 mg | ORAL_CAPSULE | ORAL | Status: DC

## 2012-07-10 NOTE — Telephone Encounter (Signed)
Called the patient informed to pickup hardcopy at the front desk. 

## 2012-07-10 NOTE — Telephone Encounter (Signed)
Pt called requesting 3 mth refill of Adderalll

## 2012-07-10 NOTE — Telephone Encounter (Signed)
Done hardcopy to robin  

## 2012-07-23 ENCOUNTER — Ambulatory Visit: Payer: BC Managed Care – PPO | Admitting: Internal Medicine

## 2012-09-04 ENCOUNTER — Telehealth: Payer: Self-pay | Admitting: Internal Medicine

## 2012-09-04 MED ORDER — AMPHETAMINE-DEXTROAMPHET ER 25 MG PO CP24: 25.0000 mg | ORAL_CAPSULE | ORAL | Status: DC

## 2012-09-04 NOTE — Telephone Encounter (Signed)
Pt called req refill for adderall for 3 months supply. Please advise.

## 2012-09-04 NOTE — Telephone Encounter (Signed)
Has a rx already for adderal for June 21  OK for October 07, 2012 rx adderal - Done hardcopy to robin  Due for ROV aug 2014 please

## 2012-09-04 NOTE — Telephone Encounter (Signed)
Called the patient left a detailed message of MD instructions. 

## 2012-10-19 ENCOUNTER — Other Ambulatory Visit: Payer: Self-pay | Admitting: Internal Medicine

## 2012-10-31 ENCOUNTER — Ambulatory Visit (INDEPENDENT_AMBULATORY_CARE_PROVIDER_SITE_OTHER)
Admission: RE | Admit: 2012-10-31 | Discharge: 2012-10-31 | Disposition: A | Discharge status: Home / Self Care | Payer: BC Managed Care – PPO | Source: Ambulatory Visit | Attending: Internal Medicine | Admitting: Internal Medicine

## 2012-10-31 ENCOUNTER — Ambulatory Visit (INDEPENDENT_AMBULATORY_CARE_PROVIDER_SITE_OTHER): Payer: BC Managed Care – PPO | Admitting: Internal Medicine

## 2012-10-31 ENCOUNTER — Other Ambulatory Visit (INDEPENDENT_AMBULATORY_CARE_PROVIDER_SITE_OTHER): Payer: BC Managed Care – PPO

## 2012-10-31 ENCOUNTER — Encounter: Payer: Self-pay | Admitting: Internal Medicine

## 2012-10-31 VITALS — BP 152/82 | HR 84 | Temp 99.5°F | Ht 62.0 in | Wt 121.2 lb

## 2012-10-31 DIAGNOSIS — I10 Essential (primary) hypertension: Secondary | ICD-10-CM

## 2012-10-31 DIAGNOSIS — R61 Generalized hyperhidrosis: Secondary | ICD-10-CM | POA: Insufficient documentation

## 2012-10-31 DIAGNOSIS — R5381 Other malaise: Secondary | ICD-10-CM

## 2012-10-31 DIAGNOSIS — R5383 Other fatigue: Secondary | ICD-10-CM

## 2012-10-31 DIAGNOSIS — F988 Other specified behavioral and emotional disorders with onset usually occurring in childhood and adolescence: Secondary | ICD-10-CM

## 2012-10-31 DIAGNOSIS — F32A Depression, unspecified: Secondary | ICD-10-CM

## 2012-10-31 DIAGNOSIS — F329 Major depressive disorder, single episode, unspecified: Secondary | ICD-10-CM

## 2012-10-31 LAB — URINALYSIS, ROUTINE W REFLEX MICROSCOPIC
Bilirubin Urine: NEGATIVE
Hgb urine dipstick: NEGATIVE
Leukocytes, UA: NEGATIVE
Nitrite: NEGATIVE
Urobilinogen, UA: 0.2 (ref 0.0–1.0)

## 2012-10-31 LAB — CBC WITH DIFFERENTIAL/PLATELET
Basophils Absolute: 0 10*3/uL (ref 0.0–0.1)
Basophils Relative: 0.6 % (ref 0.0–3.0)
Eosinophils Absolute: 0 10*3/uL (ref 0.0–0.7)
Lymphocytes Relative: 21.2 % (ref 12.0–46.0)
MCHC: 34.2 g/dL (ref 30.0–36.0)
Neutrophils Relative %: 71 % (ref 43.0–77.0)
Platelets: 266 10*3/uL (ref 150.0–400.0)
RBC: 4.26 Mil/uL (ref 3.87–5.11)
RDW: 12.5 % (ref 11.5–14.6)

## 2012-10-31 LAB — TSH: TSH: 2.43 u[IU]/mL (ref 0.35–5.50)

## 2012-10-31 MED ORDER — AMPHETAMINE-DEXTROAMPHET ER 10 MG PO CP24: 10.0000 mg | ORAL_CAPSULE | Freq: Every day | ORAL | Status: DC

## 2012-10-31 MED ORDER — AMPHETAMINE-DEXTROAMPHET ER 25 MG PO CP24: 25.0000 mg | ORAL_CAPSULE | ORAL | Status: DC

## 2012-10-31 MED ORDER — CITALOPRAM HYDROBROMIDE 20 MG PO TABS: 20.0000 mg | ORAL_TABLET | Freq: Every day | ORAL | Status: DC

## 2012-10-31 NOTE — Assessment & Plan Note (Signed)
Etiology unclear, Exam otherwise benign, to check labs as documented, follow with expectant management  

## 2012-10-31 NOTE — Progress Notes (Signed)
Subjective:    Patient ID: Aimee Thompson, female    DOB: 09-15-1970, 42 y.o.   MRN: [id number]  HPI Here with several wks worsening shakiness and tremulous, has gained about 10 lbs with less normally intensive exercise, and noted severe elev BP yesterday at the blood donation center, as well as elev BP today as well.  Has had new worsening unsuual fatigue and seemingly random sweats, some day but more at nighttime.  Admits to increase stress recently, oldest child getting ready to leave for college, still has 42yo and 42yo at home.  Denies worsening depressive symptoms, suicidal ideation, or panic.  Has been seeing psychologist as well; suggested increased citalopram and adding smaller adderall in the PM as her symptoms recur with only the daily ER 25 mg in the am.   TSH oct 2013 normal. Past Medical History  Diagnosis Date  . ASTHMA   . HEART MURMUR, HX OF   . ADD (attention deficit disorder)    Past Surgical History  Procedure Laterality Date  . Laproscopy      reports that she has never smoked. She does not have any smokeless tobacco history on file. She reports that  drinks alcohol. She reports that she does not use illicit drugs. family history includes Hypertension in her other. Allergies  Allergen Reactions  . Penicillins     REACTION: hives   Current Outpatient Prescriptions on File Prior to Visit  Medication Sig Dispense Refill  . albuterol (PROAIR HFA) 108 (90 BASE) MCG/ACT inhaler Inhale 2 puffs into the lungs every 6 (six) hours as needed.  1 Inhaler  5  . ALPRAZolam (XANAX) 0.25 MG tablet Take 1 tablet (0.25 mg total) by mouth 2 (two) times daily as needed.  60 tablet  2  . Norethindrone-Ethinyl Estradiol-Fe Biphas (LO LOESTRIN FE) 1 MG-10 MCG / 10 MCG tablet Take 1 tablet by mouth daily.      . SYMBICORT 160-4.5 MCG/ACT inhaler USE TWO PUFFS TWO TIMES A DAY AS DIRECTED  10.2 g  5  . ALPRAZolam (XANAX) 0.25 MG tablet Take 1 tablet (0.25 mg total) by mouth 2 (two) times  daily as needed for sleep.  60 tablet  0   No current facility-administered medications on file prior to visit.   Review of Systems  Constitutional: Negative for unexpected weight change, or unusual diaphoresis  HENT: Negative for tinnitus.   Eyes: Negative for photophobia and visual disturbance.  Respiratory: Negative for choking and stridor.   Gastrointestinal: Negative for vomiting and blood in stool.  Genitourinary: Negative for hematuria and decreased urine volume.  Musculoskeletal: Negative for acute joint swelling Skin: Negative for color change and wound.  Neurological: Negative for numbness or specific weakness, or HA's     Objective:   Physical Exam BP 152/82  Pulse 84  Temp(Src) 99.5 F (37.5 C) (Oral)  Ht 5\' 2"  (1.575 m)  Wt 121 lb 4 oz (54.999 kg)  BMI 22.17 kg/m2  SpO2 98% VS noted, not overwt but not ultra thin as before Constitutional: Pt appears well-developed and well-nourished.  HENT: Head: NCAT.  Right Ear: External ear normal.  Left Ear: External ear normal.  Eyes: Conjunctivae and EOM are normal. Pupils are equal, round, and reactive to light.  Neck: Normal range of motion. Neck supple.  Cardiovascular: Normal rate and regular rhythm.   Pulmonary/Chest: Effort normal and breath sounds normal.  Abd:  Soft, NT, non-distended, + BS, has some mild vague right flank tender of undetermined significance, no  bruit, no pulsatile mass Neurological: Pt is alert. Not confused  Skin: Skin is warm. No erythema. no rash , no LE edema Psychiatric: Pt behavior is normal. Thought content normal. mild nervous    Assessment & Plan:

## 2012-10-31 NOTE — Assessment & Plan Note (Signed)
Also for esr, likely related to above,  to f/u any worsening symptoms or concerns

## 2012-10-31 NOTE — Assessment & Plan Note (Addendum)
Has gained 10 lbs with less intensive exercise but still unusual persistent elev BP for her; will check ecg, cxr, metaneph/catechol, but also r/o RAS with abd u/s, given anxiety will add metoprolol 25 bid, to f/u next visit, or sooner if persistent elev at home > 140/90

## 2012-10-31 NOTE — Patient Instructions (Addendum)
Your EKG was OK today OK to increase the adderall to take 25 in the am, 10 at 4 pm Ok to increase the citalopram to 20 mg per day Ok to start metoprolol 25 mg twice per day (low dose) Please continue to monitor your Blood Pressure on a regular basis, the goal being less than 140/90 Please go to the XRAY Department in the Basement (go straight as you get off the elevator) for the x-ray testing Please go to the LAB in the Basement (turn left off the elevator) for the tests to be done today You will be contacted by phone if any changes need to be made immediately.  Otherwise, you will receive a letter about your results with an explanation, but please check with MyChart first.  Please remember to sign up for My Chart if you have not done so, as this will be important to you in the future with finding out test results, communicating by private email, and scheduling acute appointments online when needed.  Please return in 3 months, or sooner if needed  You will be contacted regarding the referral for: abdominal ultrasound (to check the kidneys and make sure no artery blockage)

## 2012-10-31 NOTE — Assessment & Plan Note (Signed)
New onset, unusual for her, ECG reviewed as per emr, also for cxr, labs as ordered, and abd u/s - r/o RAS, for metoprolol 25 bid, f/u next visit  Note:  Total time for pt hx, exam, review of record with pt in the room, determination of diagnoses and plan for further eval and tx is > 40 min, with over 50% spent in coordination and counseling of patient

## 2012-10-31 NOTE — Assessment & Plan Note (Signed)
Overall stable, but with increased anxiety/stress, ok for increased citalopram 20 qd

## 2012-10-31 NOTE — Assessment & Plan Note (Signed)
Ok for change adderall to ER 25 in AM, ER 10 in PM,  to f/u any worsening symptoms or concerns

## 2012-11-01 LAB — BASIC METABOLIC PANEL
CO2: 23 mEq/L (ref 19–32)
Chloride: 101 mEq/L (ref 96–112)
Creatinine, Ser: 0.7 mg/dL (ref 0.4–1.2)
Potassium: 3.2 mEq/L — ABNORMAL LOW (ref 3.5–5.1)

## 2012-11-01 LAB — HEPATIC FUNCTION PANEL
ALT: 36 U/L — ABNORMAL HIGH (ref 0–35)
AST: 54 U/L — ABNORMAL HIGH (ref 0–37)
Bilirubin, Direct: 0.2 mg/dL (ref 0.0–0.3)
Total Protein: 6.8 g/dL (ref 6.0–8.3)

## 2012-11-02 ENCOUNTER — Telehealth: Payer: Self-pay | Admitting: Internal Medicine

## 2012-11-02 NOTE — Telephone Encounter (Signed)
Sue Lush calling to report that she has take pt to ER in [place] b/c of high BP, nausea and vomiting. They are there now. Will update [place] after visit.

## 2012-11-05 ENCOUNTER — Telehealth: Payer: Self-pay | Admitting: *Deleted

## 2012-11-05 NOTE — Telephone Encounter (Signed)
[facility] Triage Call Report Triage Record Num: [id number] Operator: April Finney Patient Name: Aimee Thompson Call Date & Time: 11/03/2012 2:14:44PM Patient Phone: [phone] PCP: Patient Gender: Female PCP Fax : Patient DOB: 30-Oct-1970 [facility] Name: [facility] Reason for Call: Caller: [place]/Patient; PCP: Oliver Barre (Adults only); CB#: [phone]; Call regarding medication not at pharmacy. Checked epic and found prescription for Metoprolol that was not sent. Called Karin Golden [phone] and called in prescription as listed in MD notes. Notified Neima. Protocol(s) Used: Office Note Recommended Outcome per Protocol: Information Noted and Sent to Office Reason for Outcome: Caller information to office Care Advice: ~

## 2012-11-12 ENCOUNTER — Other Ambulatory Visit: Payer: BC Managed Care – PPO

## 2012-11-15 ENCOUNTER — Other Ambulatory Visit: Payer: BC Managed Care – PPO

## 2012-11-22 ENCOUNTER — Ambulatory Visit
Admission: RE | Admit: 2012-11-22 | Discharge: 2012-11-22 | Disposition: A | Discharge status: Home / Self Care | Payer: BC Managed Care – PPO | Source: Ambulatory Visit | Attending: Internal Medicine | Admitting: Internal Medicine

## 2012-11-22 ENCOUNTER — Other Ambulatory Visit: Payer: Self-pay | Admitting: Internal Medicine

## 2012-11-22 DIAGNOSIS — I10 Essential (primary) hypertension: Secondary | ICD-10-CM

## 2012-11-22 NOTE — Telephone Encounter (Signed)
Lab re-ordered as rec'd per radiology

## 2012-12-10 ENCOUNTER — Other Ambulatory Visit: Payer: Self-pay | Admitting: Internal Medicine

## 2012-12-11 ENCOUNTER — Other Ambulatory Visit: Payer: BC Managed Care – PPO

## 2012-12-13 ENCOUNTER — Other Ambulatory Visit: Payer: BC Managed Care – PPO

## 2012-12-13 ENCOUNTER — Other Ambulatory Visit: Payer: No Typology Code available for payment source

## 2012-12-13 ENCOUNTER — Ambulatory Visit
Admission: RE | Admit: 2012-12-13 | Discharge: 2012-12-13 | Disposition: A | Discharge status: Home / Self Care | Payer: BC Managed Care – PPO | Source: Ambulatory Visit | Attending: Internal Medicine | Admitting: Internal Medicine

## 2012-12-13 DIAGNOSIS — I10 Essential (primary) hypertension: Secondary | ICD-10-CM

## 2012-12-16 LAB — CATECHOLAMINES, FRACTIONATED, URINE, 24 HOUR
Epinephrine, 24 hr Urine: 7 mcg/24 h (ref 2–24)
Norepinephrine, 24 hr Ur: 16 mcg/24 h (ref 15–100)

## 2012-12-18 LAB — METANEPHRINES, URINE, 24 HOUR
Metaneph Total, Ur: 191 mcg/24 h (ref 182–739)
Metanephrines, Ur: 66 mcg/24 h (ref 58–203)
Normetanephrine, 24H Ur: 125 mcg/24 h (ref 88–649)

## 2013-01-24 ENCOUNTER — Other Ambulatory Visit: Payer: Self-pay

## 2013-01-24 ENCOUNTER — Encounter: Payer: Self-pay | Admitting: Internal Medicine

## 2013-01-24 ENCOUNTER — Ambulatory Visit (INDEPENDENT_AMBULATORY_CARE_PROVIDER_SITE_OTHER): Payer: BC Managed Care – PPO | Admitting: Internal Medicine

## 2013-01-24 VITALS — BP 138/90 | HR 83 | Temp 98.5°F | Ht 62.5 in | Wt 122.2 lb

## 2013-01-24 DIAGNOSIS — I10 Essential (primary) hypertension: Secondary | ICD-10-CM

## 2013-01-24 DIAGNOSIS — Z23 Encounter for immunization: Secondary | ICD-10-CM

## 2013-01-24 DIAGNOSIS — J019 Acute sinusitis, unspecified: Secondary | ICD-10-CM | POA: Insufficient documentation

## 2013-01-24 DIAGNOSIS — F329 Major depressive disorder, single episode, unspecified: Secondary | ICD-10-CM

## 2013-01-24 DIAGNOSIS — F988 Other specified behavioral and emotional disorders with onset usually occurring in childhood and adolescence: Secondary | ICD-10-CM

## 2013-01-24 DIAGNOSIS — F32A Depression, unspecified: Secondary | ICD-10-CM

## 2013-01-24 MED ORDER — FLUTICASONE PROPIONATE 50 MCG/ACT NA SUSP: 2.0000 | Freq: Every day | NASAL | Status: DC

## 2013-01-24 MED ORDER — AMPHETAMINE-DEXTROAMPHET ER 25 MG PO CP24: 25.0000 mg | ORAL_CAPSULE | ORAL | Status: DC

## 2013-01-24 MED ORDER — LEVOFLOXACIN 250 MG PO TABS: 250.0000 mg | ORAL_TABLET | Freq: Every day | ORAL | Status: DC

## 2013-01-24 MED ORDER — METOPROLOL SUCCINATE ER 50 MG PO TB24: 50.0000 mg | ORAL_TABLET | Freq: Every day | ORAL | Status: DC

## 2013-01-24 MED ORDER — AMPHETAMINE-DEXTROAMPHET ER 10 MG PO CP24: 10.0000 mg | ORAL_CAPSULE | Freq: Every day | ORAL | Status: DC

## 2013-01-24 NOTE — Assessment & Plan Note (Signed)
stable overall by history and exam, and pt to continue medical treatment as before,  to f/u any worsening symptoms or concerns, declines referral for counseling, no SI or HI

## 2013-01-24 NOTE — Assessment & Plan Note (Signed)
Stable, for med refill 

## 2013-01-24 NOTE — Progress Notes (Signed)
Pre visit review using our clinic review tool, if applicable. No additional management support is needed unless otherwise documented below in the visit note. 

## 2013-01-24 NOTE — Progress Notes (Signed)
Subjective:    Patient ID: Aimee Thompson, female    DOB: [birth year], 42 y.o.   MRN: [id number]  HPI  Here to f/u;  Here with 2 wks acute onset fever, rightfacial pain, pressure, right eearache, headache, general weakness and malaise, and slight non prod cough, but pt denies chest pain, wheezing, increased sob or doe, orthopnea, PND, increased LE swelling, palpitations, dizziness or syncope.  Has reduced right ear hearing, slight nausea, no vomiting or tinnitus or vertigo;  Has already tried mult OTC including claritin and mucinex, as well as valsalva maneuvers.  Out of BP meds, BP at home persistently mild elevated, out of what sounds like metoprolol for over 1 mo.  ADD meds cont to work well with social and work with concentration and task completion; has had more stress recently due to breakup of relationship with significant other, they still share time with 3 children Past Medical History  Diagnosis Date  . ASTHMA   . HEART MURMUR, HX OF   . ADD (attention deficit disorder)    Past Surgical History  Procedure Laterality Date  . Laproscopy      reports that she has never smoked. She does not have any smokeless tobacco history on file. She reports that she drinks alcohol. She reports that she does not use illicit drugs. family history includes Hypertension in her other. Allergies  Allergen Reactions  . Penicillins     REACTION: hives   Current Outpatient Prescriptions on File Prior to Visit  Medication Sig Dispense Refill  . albuterol (PROAIR HFA) 108 (90 BASE) MCG/ACT inhaler Inhale 2 puffs into the lungs every 6 (six) hours as needed.  1 Inhaler  5  . ALPRAZolam (XANAX) 0.25 MG tablet Take 1 tablet (0.25 mg total) by mouth 2 (two) times daily as needed.  60 tablet  2  . citalopram (CELEXA) 20 MG tablet Take 1 tablet (20 mg total) by mouth daily.  90 tablet  3  . Norethindrone-Ethinyl Estradiol-Fe Biphas (LO LOESTRIN FE) 1 MG-10 MCG / 10 MCG tablet Take 1 tablet by mouth daily.       . SYMBICORT 160-4.5 MCG/ACT inhaler USE TWO PUFFS TWO TIMES A DAY AS DIRECTED  10.2 g  5  . ALPRAZolam (XANAX) 0.25 MG tablet Take 1 tablet (0.25 mg total) by mouth 2 (two) times daily as needed for sleep.  60 tablet  0   No current facility-administered medications on file prior to visit.   Review of Systems  Constitutional: Negative for unexpected weight change, or unusual diaphoresis  HENT: Negative for tinnitus.   Eyes: Negative for photophobia and visual disturbance.  Respiratory: Negative for choking and stridor.   Gastrointestinal: Negative for vomiting and blood in stool.  Genitourinary: Negative for hematuria and decreased urine volume.  Musculoskeletal: Negative for acute joint swelling Skin: Negative for color change and wound.  Neurological: Negative for tremors and numbness other than noted  Psychiatric/Behavioral: Negative for decreased concentration or  hyperactivity.       Objective:   Physical Exam BP 138/90  Pulse 83  Temp(Src) 98.5 F (36.9 C) (Oral)  Ht 5' 2.5" (1.588 m)  Wt 122 lb 4 oz (55.452 kg)  BMI 21.99 kg/m2  SpO2 95% VS noted, mild ill appearing Constitutional: Pt appears well-developed and well-nourished.  HENT: Head: NCAT.  Right Ear: External ear normal.  Left Ear: External ear normal.  Eyes: Conjunctivae and EOM are normal. Pupils are equal, round, and reactive to light Bilat tm's with mild erythema.  Max sinus areas mild tender right maxillary.  Pharynx with mild erythema, no exudate.  Neck: Normal range of motion. Neck supple.  Cardiovascular: Normal rate and regular rhythm.   Pulmonary/Chest: Effort normal and breath sounds without rales or wheezing  Neurological: Pt is alert. Not confused  Skin: Skin is warm. No erythema.  Psychiatric: Pt behavior is normal. Thought content normal. 1+ nervous    Assessment & Plan:

## 2013-01-24 NOTE — Assessment & Plan Note (Signed)
For toprol XL 50 qd, o/w stable overall by history and exam, recent data reviewed with pt, and pt to continue medical treatment as before,  to f/u any worsening symptoms or concerns BP Readings from Last 3 Encounters:  01/24/13 138/90  10/31/12 152/82  12/22/11 128/80   Hopefully beta blocker to help with nerves as well

## 2013-01-24 NOTE — Assessment & Plan Note (Addendum)
Mild to mod, for antibx course,  to f/u any worsening symptoms or concerns, also for flonase asd given ? Underlying allergy symptoms and prob right eustach valve dysfxn symtpoms

## 2013-01-24 NOTE — Patient Instructions (Signed)
Please take all new medication as prescribed - the antibiotic, and the nasal spray (sorry we did not have sample today) Please continue all other medications as before, and refills have been done if requested, including the metoprolol (toprol XL) for blood pressure, and the adderall  Please have the pharmacy call with any other refills you may need.  Please remember to sign up for My Chart if you have not done so, as this will be important to you in the future with finding out test results, communicating by private email, and scheduling acute appointments online when needed.  Please return in 6 months, or sooner if needed

## 2013-01-31 ENCOUNTER — Ambulatory Visit: Payer: BC Managed Care – PPO | Admitting: Internal Medicine

## 2013-02-08 ENCOUNTER — Emergency Department (HOSPITAL_COMMUNITY)
Admission: EM | Admit: 2013-02-08 | Discharge: 2013-02-09 | Disposition: A | Discharge status: Other Acute Inpatient Hospital | Payer: BC Managed Care – PPO | Attending: Emergency Medicine | Admitting: Emergency Medicine

## 2013-02-08 ENCOUNTER — Encounter (HOSPITAL_COMMUNITY): Payer: Self-pay | Admitting: Emergency Medicine

## 2013-02-08 DIAGNOSIS — R011 Cardiac murmur, unspecified: Secondary | ICD-10-CM | POA: Insufficient documentation

## 2013-02-08 DIAGNOSIS — F329 Major depressive disorder, single episode, unspecified: Secondary | ICD-10-CM | POA: Insufficient documentation

## 2013-02-08 DIAGNOSIS — Z3202 Encounter for pregnancy test, result negative: Secondary | ICD-10-CM | POA: Insufficient documentation

## 2013-02-08 DIAGNOSIS — J45909 Unspecified asthma, uncomplicated: Secondary | ICD-10-CM | POA: Insufficient documentation

## 2013-02-08 DIAGNOSIS — Z880 Allergy status to penicillin: Secondary | ICD-10-CM | POA: Insufficient documentation

## 2013-02-08 DIAGNOSIS — Z79899 Other long term (current) drug therapy: Secondary | ICD-10-CM | POA: Insufficient documentation

## 2013-02-08 DIAGNOSIS — F3289 Other specified depressive episodes: Secondary | ICD-10-CM | POA: Insufficient documentation

## 2013-02-08 DIAGNOSIS — R45851 Suicidal ideations: Secondary | ICD-10-CM | POA: Insufficient documentation

## 2013-02-08 DIAGNOSIS — F988 Other specified behavioral and emotional disorders with onset usually occurring in childhood and adolescence: Secondary | ICD-10-CM | POA: Insufficient documentation

## 2013-02-08 DIAGNOSIS — F32A Depression, unspecified: Secondary | ICD-10-CM

## 2013-02-08 LAB — CBC
HCT: 38.8 % (ref 36.0–46.0)
Hemoglobin: 13.7 g/dL (ref 12.0–15.0)
Platelets: 250 10*3/uL (ref 150–400)
RBC: 3.98 MIL/uL (ref 3.87–5.11)

## 2013-02-08 LAB — COMPREHENSIVE METABOLIC PANEL
ALT: 25 U/L (ref 0–35)
AST: 40 U/L — ABNORMAL HIGH (ref 0–37)
Alkaline Phosphatase: 71 U/L (ref 39–117)
CO2: 24 mEq/L (ref 19–32)
Creatinine, Ser: 0.94 mg/dL (ref 0.50–1.10)
GFR calc Af Amer: 86 mL/min — ABNORMAL LOW (ref >90)
GFR calc non Af Amer: 74 mL/min — ABNORMAL LOW (ref >90)
Glucose, Bld: 113 mg/dL — ABNORMAL HIGH (ref 70–99)
Potassium: 3.8 mEq/L (ref 3.5–5.1)
Sodium: 137 mEq/L (ref 135–145)
Total Protein: 7.3 g/dL (ref 6.0–8.3)

## 2013-02-08 LAB — ETHANOL: Alcohol, Ethyl (B): 251 mg/dL — ABNORMAL HIGH (ref 0–11)

## 2013-02-08 LAB — RAPID URINE DRUG SCREEN, HOSP PERFORMED
Amphetamines: POSITIVE — AB
Barbiturates: NOT DETECTED
Cocaine: NOT DETECTED
Tetrahydrocannabinol: NOT DETECTED

## 2013-02-08 MED ORDER — IBUPROFEN 200 MG PO TABS: 600.0000 mg | ORAL_TABLET | Freq: Three times a day (TID) | ORAL | Status: DC | PRN

## 2013-02-08 MED ORDER — LORAZEPAM 1 MG PO TABS
1.0000 mg | ORAL_TABLET | Freq: Three times a day (TID) | ORAL | Status: DC | PRN
  Administered 2013-02-09: 1 mg via ORAL
  Filled 2013-02-08: qty 1

## 2013-02-08 MED ORDER — ALUM & MAG HYDROXIDE-SIMETH 200-200-20 MG/5ML PO SUSP: 30.0000 mL | ORAL | Status: DC | PRN

## 2013-02-08 MED ORDER — ACETAMINOPHEN 325 MG PO TABS: 650.0000 mg | ORAL_TABLET | ORAL | Status: DC | PRN

## 2013-02-08 MED ORDER — NICOTINE 21 MG/24HR TD PT24: 21.0000 mg | MEDICATED_PATCH | Freq: Every day | TRANSDERMAL | Status: DC

## 2013-02-08 MED ORDER — ZOLPIDEM TARTRATE 5 MG PO TABS: 5.0000 mg | ORAL_TABLET | Freq: Every evening | ORAL | Status: DC | PRN

## 2013-02-08 MED ORDER — ONDANSETRON HCL 4 MG PO TABS: 4.0000 mg | ORAL_TABLET | Freq: Three times a day (TID) | ORAL | Status: DC | PRN

## 2013-02-08 NOTE — ED Notes (Signed)
Patient is alert and oriented x3.  She is complaining of SI thoughts with attempts.  Patient states that her life sucks right now with a separation going on in her life. With her most recent suicide attempt in the last few days. She does have a plan Of taking pills and adds that she has been drinking way too much

## 2013-02-08 NOTE — BH Assessment (Signed)
Assessment Note  Patient is a 42 year old white female with a plan to cut her wrist.  Patient reports that she was hold a knife in her hand for 5 hours today thinking of ways that she could kill herself.  Patient reports that she is not able to contract for safety.  Patient denies previous psychiatric hospitalizations.  Patient denies previous suicide attempts.    Patient reports increased levels of depression and hopelessness for the past 2 and a half years.  Patient reports that her wife cheated on her with her best friend 2 years ago and she has not been able to move.  Patient reports a previous diagnosis of depression and ADHD.  Patient receives medication management from her medical provider.  Patient reports that she is compliant with taking her medication but they are not helping.  Patient reports that she takes celexa, aderall and xanax.    Patient denies substance abuse but does report increased drinking since her marital discourse 2 years ago.  Patient  Reports that she had her first drink at the age of 42 years old.  Patient reports that her last drink was some time this evening.  Patient reports that she does not know what her longest period of sobriety has been.  Patient denies any previous detox or substance abuse treatment.  Patient denies withdrawal symptoms.   Patient denies HI.  Patient denies psychosis.       Axis I: Major Depression, single episode and ADHD Axis II: Deferred Axis III:  Past Medical History  Diagnosis Date  . ASTHMA   . HEART MURMUR, HX OF   . ADD (attention deficit disorder)    Axis IV: other psychosocial or environmental problems and problems related to social environment Axis V: 31-40 impairment in reality testing  Past Medical History:  Past Medical History  Diagnosis Date  . ASTHMA   . HEART MURMUR, HX OF   . ADD (attention deficit disorder)     Past Surgical History  Procedure Laterality Date  . Laproscopy      Family History:  Family  History  Problem Relation Age of Onset  . Hypertension Other     Social History:  reports that she has never smoked. She does not have any smokeless tobacco history on file. She reports that she drinks alcohol. She reports that she does not use illicit drugs.  Additional Social History:     CIWA:   COWS:    Allergies:  Allergies  Allergen Reactions  . Penicillins     REACTION: hives    Home Medications:  No prescriptions prior to admission    OB/GYN Status:  No LMP recorded.  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.)  [facility] Crisis Care Plan Living Arrangements: Spouse/significant other  Education Status Is patient currently in school?: No  Risk to self Suicidal Ideation: Yes-Currently Present Suicidal Intent: Yes-Currently Present Is patient at risk for suicide?: Yes Suicidal Plan?: Yes-Currently Present Specify Current Suicidal Plan: cutting her wrist Access to Means: Yes Specify Access to Suicidal Means:  cutting herself  What has been your use of drugs/alcohol within the last 12 months?: alcohol Previous Attempts/Gestures: No How many times?: 0 Other Self Harm Risks: None Triggers for Past Attempts: Spouse contact Intentional Self Injurious Behavior:  (Bulimia) Family Suicide History: No Recent stressful life event(s): Conflict (Comment);Divorce;Turmoil (Comment) (marital issues) Persecutory voices/beliefs?:  No Depression: Yes Depression Symptoms: Despondent;Insomnia;Fatigue;Guilt;Loss of interest in usual pleasures;Feeling worthless/self pity;Tearfulness;Isolating Substance abuse history and/or treatment for substance abuse?: No Suicide prevention information given to non-admitted patients: Yes  Risk to Others Homicidal Ideation: No Thoughts of Harm to Others: No Current Homicidal Intent: No Current Homicidal Plan: No Access to Homicidal Means: No Identified Victim: None History of harm to others?: No Assessment of Violence: None Noted Violent Behavior Description: calm Does patient have access to weapons?: No Criminal Charges Pending?: No Does patient have a court date: No  Psychosis Hallucinations: None noted Delusions: None noted  Mental Status Report Appear/Hygiene: Disheveled Eye Contact: Fair Motor Activity: Freedom of movement Speech: Logical/coherent Level of Consciousness: Alert Mood: Depressed;Helpless;Worthless, low self-esteem;Empty;Despair Affect: Depressed;Blunted;Fearful;Sad;Sullen Anxiety Level: None Thought Processes: Coherent;Relevant Judgement: Unimpaired Orientation: Person;Place;Time;Situation Obsessive Compulsive Thoughts/Behaviors: None  Cognitive Functioning Concentration: Decreased Memory: Recent Intact;Remote Intact IQ: Average Insight: Fair Impulse Control: Fair Appetite: Fair Weight Loss: 0 Weight Gain: 0 Sleep: Decreased Total Hours of Sleep: 3 Vegetative Symptoms: Staying in bed;Decreased grooming  ADLScreening [facility] Assessment Services) Patient's cognitive ability adequate to safely complete daily activities?: Yes Patient able to express need for assistance with ADLs?: Yes Independently performs ADLs?: Yes (appropriate for developmental age)  Prior Inpatient Therapy Prior Inpatient Therapy: No Prior Therapy Dates: na Prior Therapy Facilty/Provider(s): na Reason for Treatment: na  Prior Outpatient Therapy Prior Outpatient  Therapy: Yes Prior Therapy Dates: ongiong  Prior Therapy Facilty/Provider(s): Family Doctor  Reason for Treatment: Medication Managment  ADL Screening (condition at time of admission) Patient's cognitive ability adequate to safely complete daily activities?: Yes Patient able to express need for assistance with ADLs?: Yes Independently performs ADLs?: Yes (appropriate for developmental age)                  Additional Information 1:1 In Past 12 Months?: No CIRT Risk: No Elopement Risk: No Does patient have medical clearance?: Yes     Disposition:  Disposition Initial Assessment Completed for this Encounter: Yes Disposition of Patient: Inpatient treatment program Type of inpatient treatment program: Adult  On Site Evaluation by:   Reviewed with Physician:    Phillip Heal LaVerne 02/08/2013 10:04 PM

## 2013-02-08 NOTE — ED Notes (Signed)
Patient was a walk in to [facility] for depression and SI. Patient was escorted to [facility] by [facility] tech. Patient placed in triage room. Triage tech notified. Charge nurse notified patient is SI.

## 2013-02-08 NOTE — ED Provider Notes (Signed)
CSN: [id number]     Arrival date & time 02/08/13  2210 History  This chart was scribed for non-physician practitioner, Antony Madura, PA-C,working with Suzi Roots, MD, by Karle Plumber, ED Scribe.  This patient was seen in room WTR4/WLPT4 and the patient's care was started at 10:44 PM.  Chief Complaint  Patient presents with  . Suicidal  . Depression   The history is provided by the patient and the police. No language interpreter was used.   HPI Comments:  Aimee Thompson is a 42 y.o. female with h/o depression who presents to the Emergency Department complaining of suicidal ideations for approximately one month. Pt states she presented to behavioral health and was sent here to have lab work done before being admitted there. She states her and her partner are going through a separation and she is having a difficult time dealing with that. She states she has been drinking heavily for the past couple of months and has drank so much that she passed out in the middle of [employer]. She reports staying up and thinking about committing suicide for hours on end. She states she has been seeing a therapist. She denies any homicidal ideations. She denies using any illicit drugs. She cannot contract for safety.  Past Medical History  Diagnosis Date  . ASTHMA   . HEART MURMUR, HX OF   . ADD (attention deficit disorder)    Past Surgical History  Procedure Laterality Date  . Laproscopy     Family History  Problem Relation Age of Onset  . Hypertension Other    History  Substance Use Topics  . Smoking status: Never Smoker   . Smokeless tobacco: Not on file  . Alcohol Use: Yes     Comment: social   OB History   Grav Para Term Preterm Abortions TAB SAB Ect Mult Living                 Review of Systems  Psychiatric/Behavioral: Positive for suicidal ideas. Negative for self-injury.       Depression  All other systems reviewed and are negative.    Allergies  Penicillins  Home  Medications   Current Outpatient Rx  Name  Route  Sig  Dispense  Refill  . amphetamine-dextroamphetamine (ADDERALL XR) 10 MG 24 hr capsule   Oral   Take 10 mg by mouth every evening.         Marland Kitchen amphetamine-dextroamphetamine (ADDERALL XR) 25 MG 24 hr capsule   Oral   Take 1 capsule (25 mg total) by mouth every morning. To fill Jan 24, 2013   30 capsule   0   . budesonide-formoterol (SYMBICORT) 160-4.5 MCG/ACT inhaler   Inhalation   Inhale 2 puffs into the lungs 2 (two) times daily.         . citalopram (CELEXA) 20 MG tablet   Oral   Take 20 mg by mouth every morning.         . metoprolol succinate (TOPROL-XL) 50 MG 24 hr tablet   Oral   Take 50 mg by mouth every morning. Take with or immediately following a meal.         . albuterol (PROVENTIL HFA;VENTOLIN HFA) 108 (90 BASE) MCG/ACT inhaler   Inhalation   Inhale 2 puffs into the lungs every 6 (six) hours as needed for wheezing or shortness of breath.         . levofloxacin (LEVAQUIN) 250 MG tablet   Oral   Take  1 tablet (250 mg total) by mouth daily.   10 tablet   0    Triage Vitals: BP 104/61  Pulse 74  Temp(Src) 98.3 F (36.8 C) (Oral)  Resp 18  SpO2 98%  LMP 01/25/2013  Physical Exam  Nursing note and vitals reviewed. Constitutional: She is oriented to person, place, and time. She appears well-developed and well-nourished. No distress.  HENT:  Head: Normocephalic and atraumatic.  Eyes: Conjunctivae and EOM are normal. Pupils are equal, round, and reactive to light. No scleral icterus.  Neck: Normal range of motion. Neck supple.  Cardiovascular: Normal rate, regular rhythm and intact distal pulses.  Exam reveals no gallop and no friction rub.   Murmur (1/6 SEM) heard. Lungs clear to auscultation.   Pulmonary/Chest: Effort normal and breath sounds normal. No stridor. No respiratory distress. She has no wheezes. She has no rales.  Abdominal: Normal appearance. She exhibits no distension and no mass.  There is no tenderness.  Musculoskeletal: Normal range of motion.  Neurological: She is alert and oriented to person, place, and time.  Skin: Skin is warm and dry. No rash noted. She is not diaphoretic. No erythema. No pallor.  Psychiatric: Her speech is normal. She is withdrawn. She is not agitated and not aggressive. Cognition and memory are normal. She exhibits a depressed mood. She expresses suicidal ideation. She expresses no homicidal ideation. She expresses suicidal plans. She expresses no homicidal plans.    ED Course  Procedures (including critical care time) DIAGNOSTIC STUDIES: Oxygen Saturation is 98% on RA, normal by my interpretation.   COORDINATION OF CARE: 10:51 PM- Will obtain blood and urine for lab work and urinalysis. Pt verbalizes understanding and agrees to plan.  Medications  LORazepam (ATIVAN) tablet 1 mg (1 mg Oral Given 02/09/13 0400)  acetaminophen (TYLENOL) tablet 650 mg (not administered)  ibuprofen (ADVIL,MOTRIN) tablet 600 mg (not administered)  zolpidem (AMBIEN) tablet 5 mg (not administered)  nicotine (NICODERM CQ - dosed in mg/24 hours) patch 21 mg (not administered)  ondansetron (ZOFRAN) tablet 4 mg (not administered)  alum & mag hydroxide-simeth (MAALOX/MYLANTA) 200-200-20 MG/5ML suspension 30 mL (not administered)    Labs Review Labs Reviewed  CBC - Abnormal; Notable for the following:    MCH 34.4 (*)    All other components within normal limits  COMPREHENSIVE METABOLIC PANEL - Abnormal; Notable for the following:    Glucose, Bld 113 (*)    AST 40 (*)    GFR calc non Af Amer 74 (*)    GFR calc Af Amer 86 (*)    All other components within normal limits  URINE RAPID DRUG SCREEN (HOSP PERFORMED) - Abnormal; Notable for the following:    Amphetamines POSITIVE (*)    All other components within normal limits  ETHANOL - Abnormal; Notable for the following:    Alcohol, Ethyl (B) 251 (*)    All other components within normal limits  SALICYLATE  LEVEL - Abnormal; Notable for the following:    Salicylate Lvl <2.0 (*)    All other components within normal limits  ACETAMINOPHEN LEVEL  PREGNANCY, URINE   Imaging Review No results found.  EKG Interpretation   None       MDM   1. Depression   2. Suicidal ideation    42 year old female presents for worsening depression suicidal ideations over the past month. Patient denies attempted suicide, but has been having suicidal thoughts more frequently since turmoil in her personal relationships began. Patient has also been drinking  heavily as a result of this. Patient without any pain complaints today. She is nontoxic-appearing, hemodynamically stable, and afebrile. Labs significant for positive amphetamines on UDS as well as an ethanol level of 251. Mildly elevated AST likely secondary to increased alcohol intake versus hemolysis. Patient medically cleared and pending TTS eval. Temp psychiatric orders placed.  Patient evaluated by psychiatry and thought to benefit from inpatient treatment. She is stable for transfer to [facility].  I personally performed the services described in this documentation, which was scribed in my presence. The recorded information has been reviewed and is accurate.     Antony Madura, PA-C 02/09/13 [phone]

## 2013-02-08 NOTE — Progress Notes (Signed)
Writer consulted with the NP, Drenda Freeze) regarding the patient needing medical clearance for a possible inpatient hospitalization at [facility].   Writer consulted with the [place]) Luwaynda regarding the patient.   Writer contacted [employer].

## 2013-02-09 ENCOUNTER — Inpatient Hospital Stay (HOSPITAL_COMMUNITY)
Admission: AD | Admit: 2013-02-09 | Discharge: 2013-02-11 | DRG: 897 | Disposition: A | Discharge status: Home / Self Care | Payer: BC Managed Care – PPO | Attending: Psychiatry | Admitting: Psychiatry

## 2013-02-09 ENCOUNTER — Encounter (HOSPITAL_COMMUNITY): Payer: Self-pay

## 2013-02-09 DIAGNOSIS — F1020 Alcohol dependence, uncomplicated: Principal | ICD-10-CM

## 2013-02-09 DIAGNOSIS — F1099 Alcohol use, unspecified with unspecified alcohol-induced disorder: Secondary | ICD-10-CM

## 2013-02-09 DIAGNOSIS — Z79899 Other long term (current) drug therapy: Secondary | ICD-10-CM

## 2013-02-09 DIAGNOSIS — J45909 Unspecified asthma, uncomplicated: Secondary | ICD-10-CM | POA: Diagnosis present

## 2013-02-09 DIAGNOSIS — F32A Depression, unspecified: Secondary | ICD-10-CM | POA: Diagnosis present

## 2013-02-09 DIAGNOSIS — F10988 Alcohol use, unspecified with other alcohol-induced disorder: Secondary | ICD-10-CM

## 2013-02-09 DIAGNOSIS — R45851 Suicidal ideations: Secondary | ICD-10-CM

## 2013-02-09 DIAGNOSIS — I10 Essential (primary) hypertension: Secondary | ICD-10-CM | POA: Diagnosis present

## 2013-02-09 DIAGNOSIS — F988 Other specified behavioral and emotional disorders with onset usually occurring in childhood and adolescence: Secondary | ICD-10-CM

## 2013-02-09 DIAGNOSIS — F329 Major depressive disorder, single episode, unspecified: Secondary | ICD-10-CM

## 2013-02-09 DIAGNOSIS — F321 Major depressive disorder, single episode, moderate: Secondary | ICD-10-CM | POA: Diagnosis present

## 2013-02-09 HISTORY — DX: Essential (primary) hypertension: I10

## 2013-02-09 LAB — SALICYLATE LEVEL: Salicylate Lvl: <2 mg/dL — ABNORMAL LOW (ref 2.8–20.0)

## 2013-02-09 MED ORDER — LOPERAMIDE HCL 2 MG PO CAPS: 2.0000 mg | ORAL_CAPSULE | ORAL | Status: DC | PRN

## 2013-02-09 MED ORDER — MAGNESIUM HYDROXIDE 400 MG/5ML PO SUSP: 30.0000 mL | Freq: Every day | ORAL | Status: DC | PRN

## 2013-02-09 MED ORDER — BUDESONIDE-FORMOTEROL FUMARATE 160-4.5 MCG/ACT IN AERO
2.0000 | INHALATION_SPRAY | Freq: Two times a day (BID) | RESPIRATORY_TRACT | Status: DC
  Filled 2013-02-09 (×2): qty 6

## 2013-02-09 MED ORDER — ACETAMINOPHEN 325 MG PO TABS: 650.0000 mg | ORAL_TABLET | Freq: Four times a day (QID) | ORAL | Status: DC | PRN

## 2013-02-09 MED ORDER — CHLORDIAZEPOXIDE HCL 25 MG PO CAPS
25.0000 mg | ORAL_CAPSULE | Freq: Three times a day (TID) | ORAL | Status: AC
  Administered 2013-02-10: 25 mg via ORAL
  Filled 2013-02-09 (×3): qty 1

## 2013-02-09 MED ORDER — CHLORDIAZEPOXIDE HCL 25 MG PO CAPS
25.0000 mg | ORAL_CAPSULE | Freq: Once | ORAL | Status: AC
  Administered 2013-02-09: 25 mg via ORAL
  Filled 2013-02-09: qty 1

## 2013-02-09 MED ORDER — ALBUTEROL SULFATE HFA 108 (90 BASE) MCG/ACT IN AERS: 2.0000 | INHALATION_SPRAY | Freq: Four times a day (QID) | RESPIRATORY_TRACT | Status: DC | PRN

## 2013-02-09 MED ORDER — THIAMINE HCL 100 MG/ML IJ SOLN
100.0000 mg | Freq: Once | INTRAMUSCULAR | Status: AC
  Administered 2013-02-09: 100 mg via INTRAMUSCULAR
  Filled 2013-02-09: qty 2

## 2013-02-09 MED ORDER — VITAMIN B-1 100 MG PO TABS
100.0000 mg | ORAL_TABLET | Freq: Every day | ORAL | Status: DC
  Administered 2013-02-10 – 2013-02-11 (×2): 100 mg via ORAL
  Filled 2013-02-09 (×3): qty 1

## 2013-02-09 MED ORDER — CITALOPRAM HYDROBROMIDE 20 MG PO TABS
20.0000 mg | ORAL_TABLET | Freq: Every morning | ORAL | Status: DC
  Administered 2013-02-09 – 2013-02-11 (×3): 20 mg via ORAL
  Filled 2013-02-09 (×5): qty 1

## 2013-02-09 MED ORDER — NICOTINE 21 MG/24HR TD PT24
21.0000 mg | MEDICATED_PATCH | Freq: Every day | TRANSDERMAL | Status: DC
  Filled 2013-02-09 (×4): qty 1

## 2013-02-09 MED ORDER — CHLORDIAZEPOXIDE HCL 25 MG PO CAPS: 25.0000 mg | ORAL_CAPSULE | Freq: Every day | ORAL | Status: DC

## 2013-02-09 MED ORDER — CHLORDIAZEPOXIDE HCL 25 MG PO CAPS
25.0000 mg | ORAL_CAPSULE | Freq: Four times a day (QID) | ORAL | Status: AC
  Administered 2013-02-09 (×2): 25 mg via ORAL
  Filled 2013-02-09: qty 1

## 2013-02-09 MED ORDER — ADULT MULTIVITAMIN W/MINERALS CH
1.0000 | ORAL_TABLET | Freq: Every day | ORAL | Status: DC
  Administered 2013-02-09 – 2013-02-11 (×3): 1 via ORAL
  Filled 2013-02-09 (×4): qty 1

## 2013-02-09 MED ORDER — ALUM & MAG HYDROXIDE-SIMETH 200-200-20 MG/5ML PO SUSP: 30.0000 mL | ORAL | Status: DC | PRN

## 2013-02-09 MED ORDER — ONDANSETRON 4 MG PO TBDP: 4.0000 mg | ORAL_TABLET | Freq: Four times a day (QID) | ORAL | Status: DC | PRN

## 2013-02-09 MED ORDER — CHLORDIAZEPOXIDE HCL 25 MG PO CAPS
25.0000 mg | ORAL_CAPSULE | ORAL | Status: DC
  Filled 2013-02-09: qty 1

## 2013-02-09 MED ORDER — TRAZODONE HCL 50 MG PO TABS
50.0000 mg | ORAL_TABLET | Freq: Every evening | ORAL | Status: DC | PRN
  Administered 2013-02-09: 50 mg via ORAL
  Filled 2013-02-09: qty 1
  Filled 2013-02-09: qty 14

## 2013-02-09 MED ORDER — CHLORDIAZEPOXIDE HCL 25 MG PO CAPS: 25.0000 mg | ORAL_CAPSULE | Freq: Four times a day (QID) | ORAL | Status: DC | PRN

## 2013-02-09 MED ORDER — LEVOFLOXACIN 250 MG PO TABS: 250.0000 mg | ORAL_TABLET | Freq: Every day | ORAL | Status: DC

## 2013-02-09 MED ORDER — HYDROXYZINE HCL 25 MG PO TABS
25.0000 mg | ORAL_TABLET | Freq: Four times a day (QID) | ORAL | Status: DC | PRN
  Administered 2013-02-10: 25 mg via ORAL
  Filled 2013-02-09: qty 1

## 2013-02-09 MED ORDER — METOPROLOL SUCCINATE ER 50 MG PO TB24
50.0000 mg | ORAL_TABLET | Freq: Every morning | ORAL | Status: DC
  Administered 2013-02-09 – 2013-02-10 (×2): 50 mg via ORAL
  Filled 2013-02-09 (×5): qty 1

## 2013-02-09 NOTE — ED Notes (Addendum)
Additional patient belongings: Radio producer (2) [employer].OSI card, Once upon a child punch card (2) library card, (3) band aides  Voter ID card [employer] Ultra rewards card Purple hair clip [employer] gift card [employer] card [place] Plan Card (1) blank check Rayna Sexton or andrea knupp check 4 checks, 1 deposit slip, check order form Good Will receipt [employer] receipt [employer] business card (2 BB&Tdeposit slips Pure [employer] arin wells photography card (2) postal stamps. [employer] club member card [employer] shed card

## 2013-02-09 NOTE — Progress Notes (Signed)
D: pt stated that today was a pretty good day. Pt is very minimal with interaction. Pt is somewhat isolative towards other pts. Pt. Is calm and cooperative. Depressed and sad mood Denies SI/HI/AVH. Denies pain.  A: support and encouragement offered, q 15 min safety checks. scheduled medications given R: pt. Remains safe on unit. No signs of distress or complaints at this time

## 2013-02-09 NOTE — Tx Team (Signed)
Initial Interdisciplinary Treatment Plan  PATIENT STRENGTHS: (choose at least two) Ability for insight Average or above average intelligence Capable of independent living Communication skills General fund of knowledge Motivation for treatment/growth  PATIENT STRESSORS: Marital or family conflict Substance abuse   PROBLEM LIST: Problem List/Patient Goals Date to be addressed Date deferred Reason deferred Estimated date of resolution  Learn better coping skills      Recent seperation      ETOH abuse                                           DISCHARGE CRITERIA:  Ability to meet basic life and health needs Adequate post-discharge living arrangements Improved stabilization in mood, thinking, and/or behavior Motivation to continue treatment in a less acute level of care  PRELIMINARY DISCHARGE PLAN: Attend aftercare/continuing care group Attend 12-step recovery group Outpatient therapy Participate in family therapy Return to previous living arrangement  PATIENT/FAMIILY INVOLVEMENT: This treatment plan has been presented to and reviewed with the patient, Aimee Thompson.  The patient and family have been given the opportunity to ask questions and make suggestions.  Heriberto Antigua M 02/09/2013, 5:48 AM

## 2013-02-09 NOTE — ED Notes (Signed)
Per Drenda Freeze, NP, pt was a walk in at [facility] and sent to [facility] for medical clearance, thus her TTS has been done and they are waiting for a bed and her BAL to come down. Report received from [place], Charity fundraiser. Pt going to room 41.

## 2013-02-09 NOTE — ED Notes (Signed)
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

## 2013-02-09 NOTE — Progress Notes (Signed)
D: Patient denies SI/HI/AVH at this time, states she just feels "shaky". Patient has a depressed mood/affect, she is cooperative.  She was able to get some rest since arrival to the unit this morning.    A: Patient given emotional support from RN. Patient encouraged to come to staff with concerns and/or questions. Patient's medication routine continued. Patient's orders and plan of care reviewed.   R: Patient remains appropriate and cooperative. Will continue to monitor patient q15 minutes for safety.  Pt. Resting in her bed, no acute distress noted.

## 2013-02-09 NOTE — ED Notes (Signed)
Pt transporting to [facility] via [facility]. MHT walking to security desk with [facility] driver and pt to get valuables locked up with security. Pt contracting for safety.

## 2013-02-09 NOTE — ED Notes (Signed)
Called [facility], linda sending a driver.

## 2013-02-09 NOTE — ED Provider Notes (Signed)
Medical screening examination/treatment/procedure(s) were conducted as a shared visit with non-physician practitioner(s) and myself.  I personally evaluated the patient during the encounter.  EKG Interpretation   None       Pt with anxiety and depression. + SI.  Labs. Psych team eval.   Suzi Roots, MD 02/09/13 [phone]

## 2013-02-09 NOTE — Progress Notes (Signed)
Adult Psychoeducational Group Note  Date:  02/09/2013 Time:  8:00 pm  Group Topic/Focus:  Wrap-Up Group:   The focus of this group is to help patients review their daily goal of treatment and discuss progress on daily workbooks.  Participation Level:  Active  Participation Quality:  Appropriate and Sharing  Affect:  Appropriate  Cognitive:  Appropriate  Insight: Appropriate  Engagement in Group:  Engaged  Modes of Intervention:  Discussion, Education, Socialization and Support  Additional Comments: Pt stated that she is in the hospital because of alcohol and depression. Pt stated that she is a good mom and that she is strong willed. Pt stated she needs time away from her family.   Laural Benes, Levina Boyack 02/09/2013, 10:41 PM

## 2013-02-09 NOTE — H&P (Signed)
Psychiatric Admission Assessment Adult  Patient Identification:  Aimee Thompson  Date of Evaluation:  02/09/2013  Chief Complaint:  DEPRESSION  History of Present Illness: This is a 42 year old Caucasian female. Admitted to the [facility] as walk-in admit with complaints of worsening suicidal thoughts x 1 month and increased alcohol consumption. Patient reports, "I came in to this hospital last night. I have been having suicidal thoughts x 1 months. I have been drinking heavily as well. My drinking and behavior has been out of control since the last year. My wife and I are getting separated and I'm having difficulty accepting it. But, I did not attempt suicide. I drink about 1 bottle of wine and 6 packs of beer daily. I guess I'm an alcoholic. I have had no treatment or rehab for alcoholism. I don't use drugs. I'm currently having loose stools, nausea and muscle aches".    Elements:  Location:  [facility] adult unit. Quality:  Suicidal ideations, crying spells, increased alcohol consumption. Severity:  severe. Timing:  Started about a month ago. Duration:  Worsened over the last 1 year. Context:  Poor coping skills, crying spells, suicidal thoughts,.  Associated Signs/Synptoms:  Depression Symptoms:  depressed mood, feelings of worthlessness/guilt, hopelessness, suicidal thoughts without plan, anxiety,  (Hypo) Manic Symptoms:  None  Anxiety Symptoms:  Excessive Worry,  Psychotic Symptoms:  Hallucinations: None  PTSD Symptoms: Had a traumatic exposure:  None reported  Psychiatric Specialty Exam: Physical Exam  Constitutional: She is oriented to person, place, and time. She appears well-developed and well-nourished.  HENT:  Head: Normocephalic.  Eyes: Pupils are equal, round, and reactive to light.  Neck: Normal range of motion.  Cardiovascular: Normal rate.   Respiratory: Effort normal.  GI: Soft.  Musculoskeletal: Normal range of motion.  Neurological: She is alert and oriented  to person, place, and time.  Skin: Skin is warm and dry.  Psychiatric: Her speech is normal. Judgment and thought content normal. Her mood appears anxious (Rated #5). She is withdrawn. Cognition and memory are normal. She exhibits a depressed mood (Rated #8).    Review of Systems  Constitutional: Positive for chills and malaise/fatigue.  HENT: Negative.   Eyes: Negative.   Respiratory: Negative.   Cardiovascular:       Hx HTN, Asthma  Gastrointestinal: Positive for nausea, abdominal pain and diarrhea.  Genitourinary: Negative.   Musculoskeletal: Positive for myalgias.  Skin: Negative.   Neurological: Positive for tremors.  Endo/Heme/Allergies: Negative.   Psychiatric/Behavioral: Positive for depression (Currently being stabilized with medication), suicidal ideas (denies any plans and or attempts) and substance abuse (Alcoholism). Negative for hallucinations and memory loss. The patient is nervous/anxious (Rated at #5). The patient does not have insomnia.     Blood pressure 117/76, pulse 70, temperature 98.6 F (37 C), temperature source Oral, resp. rate 16, height 5\' 4"  (1.626 m), weight 54.885 kg (121 lb), last menstrual period 01/25/2013.Body mass index is 20.76 kg/(m^2).  General Appearance: Casual and Well Groomed  Patent attorney::  Fair  Speech:  Clear and Coherent  Volume:  Normal  Mood:  Depressed  Affect:  Flat and Tearful  Thought Process:  Coherent and Intact  Orientation:  Full (Time, Place, and Person)  Thought Content:  Rumination  Suicidal Thoughts:  Yes.  without intent/plan  Homicidal Thoughts:  No  Memory:  Immediate;   Good Recent;   Good Remote;   Good  Judgement:  Intact  Insight:  Fair  Psychomotor Activity:  Normal  Concentration:  Good  Recall:  Good  Akathisia:  No  Handed:  Right  AIMS (if indicated):     Assets:  Desire for Improvement Physical Health  Sleep:       Past Psychiatric History: Diagnosis: Alcohol dependence  Hospitalizations: [facility]   Outpatient Care: None reported  Substance Abuse Care: None reported  Self-Mutilation: None reported  Suicidal Attempts: Denies attempts, admits thoughts.  Violent Behaviors: None reported   Past Medical History:   Past Medical History  Diagnosis Date  . ASTHMA   . HEART MURMUR, HX OF   . ADD (attention deficit disorder)   . Hypertension    Cardiac History:  Asthma, HTN, Heart murmur  Allergies:   Allergies  Allergen Reactions  . Penicillins Hives   PTA Medications: Prescriptions prior to admission  Medication Sig Dispense Refill  . albuterol (PROVENTIL HFA;VENTOLIN HFA) 108 (90 BASE) MCG/ACT inhaler Inhale 2 puffs into the lungs every 6 (six) hours as needed for wheezing or shortness of breath.      . amphetamine-dextroamphetamine (ADDERALL XR) 10 MG 24 hr capsule Take 10 mg by mouth every evening.      Marland Kitchen amphetamine-dextroamphetamine (ADDERALL XR) 25 MG 24 hr capsule Take 1 capsule (25 mg total) by mouth every morning. To fill Jan 24, 2013  30 capsule  0  . budesonide-formoterol (SYMBICORT) 160-4.5 MCG/ACT inhaler Inhale 2 puffs into the lungs 2 (two) times daily.      . citalopram (CELEXA) 20 MG tablet Take 20 mg by mouth every morning.      Marland Kitchen levofloxacin (LEVAQUIN) 250 MG tablet Take 1 tablet (250 mg total) by mouth daily.  10 tablet  0  . metoprolol succinate (TOPROL-XL) 50 MG 24 hr tablet Take 50 mg by mouth every morning. Take with or immediately following a meal.        Previous Psychotropic Medications:  Medication/Dose  See medication lists               Substance Abuse History in the last 12 months:  yes  Consequences of Substance Abuse: Medical Consequences:  Liver damage, Possible death by overdose Legal Consequences:  Arrests, jail time, Loss of driving privilege. Family Consequences:  Family discord, divorce and or separation.  Social History:  reports that she has never smoked. She does not have any smokeless tobacco history on file. She reports  that she drinks alcohol. She reports that she does not use illicit drugs.  Additional Social History: Current Place of Residence: [place], [place]    Place of Birth: [place], [place]  Family Members: "My 3 children"  Marital Status:  Separated  Children: 3  Sons: 1  Daughters: 1  Relationships: Separated  Education:  [employer] Problems/Performance: None reported  Religious Beliefs/Practices: NA  History of Abuse (Emotional/Phsycial/Sexual): denies  Occupational Experiences: English as a second language teacher History:  None.  Legal History: None reported  Hobbies/Interests: NA  Family History:   Family History  Problem Relation Age of Onset  . Hypertension Other     Results for orders placed during the hospital encounter of 02/08/13 (from the past 72 hour(s))  URINE RAPID DRUG SCREEN (HOSP PERFORMED)     Status: Abnormal   Collection Time    02/08/13 10:41 PM      Result Value Range   Opiates NONE DETECTED  NONE DETECTED   Cocaine NONE DETECTED  NONE DETECTED   Benzodiazepines NONE DETECTED  NONE DETECTED   Amphetamines POSITIVE (*) NONE DETECTED   Tetrahydrocannabinol NONE DETECTED  NONE DETECTED   Barbiturates NONE DETECTED  NONE DETECTED   Comment:            DRUG SCREEN FOR MEDICAL PURPOSES     ONLY.  IF CONFIRMATION IS NEEDED     FOR ANY PURPOSE, NOTIFY LAB     WITHIN 5 DAYS.                LOWEST DETECTABLE LIMITS     FOR URINE DRUG SCREEN     Drug Class       Cutoff (ng/mL)     Amphetamine      1000     Barbiturate      200     Benzodiazepine   200     Tricyclics       300     Opiates          300     Cocaine          300     THC              50  CBC     Status: Abnormal   Collection Time    02/08/13 10:45 PM      Result Value Range   WBC 5.4  4.0 - 10.5 K/uL   RBC 3.98  3.87 - 5.11 MIL/uL   Hemoglobin 13.7  12.0 - 15.0 g/dL   HCT 40.9  81.1 - 91.4 %   MCV 97.5  78.0 - 100.0 fL   MCH 34.4 (*) 26.0 - 34.0 pg   MCHC 35.3  30.0 - 36.0 g/dL    RDW 78.2  95.6 - 21.3 %   Platelets 250  150 - 400 K/uL  COMPREHENSIVE METABOLIC PANEL     Status: Abnormal   Collection Time    02/08/13 10:45 PM      Result Value Range   Sodium 137  135 - 145 mEq/L   Potassium 3.8  3.5 - 5.1 mEq/L   Chloride 100  96 - 112 mEq/L   CO2 24  19 - 32 mEq/L   Glucose, Bld 113 (*) 70 - 99 mg/dL   BUN 17  6 - 23 mg/dL   Creatinine, Ser 0.86  0.50 - 1.10 mg/dL   Calcium 8.6  8.4 - 57.8 mg/dL   Total Protein 7.3  6.0 - 8.3 g/dL   Albumin 3.7  3.5 - 5.2 g/dL   AST 40 (*) 0 - 37 U/L   Comment: SLIGHT HEMOLYSIS     HEMOLYSIS AT THIS LEVEL MAY AFFECT RESULT   ALT 25  0 - 35 U/L   Alkaline Phosphatase 71  39 - 117 U/L   Total Bilirubin 0.3  0.3 - 1.2 mg/dL   GFR calc non Af Amer 74 (*) >90 mL/min   GFR calc Af Amer 86 (*) >90 mL/min   Comment: (NOTE)     The eGFR has been calculated using the CKD EPI equation.     This calculation has not been validated in all clinical situations.     eGFR's persistently <90 mL/min signify possible Chronic Kidney     Disease.  ETHANOL     Status: Abnormal   Collection Time    02/08/13 10:45 PM      Result Value Range   Alcohol, Ethyl (B) 251 (*) 0 - 11 mg/dL   Comment:            LOWEST DETECTABLE LIMIT FOR     SERUM ALCOHOL  IS 11 mg/dL     FOR MEDICAL PURPOSES ONLY  SALICYLATE LEVEL     Status: Abnormal   Collection Time    02/08/13 10:45 PM      Result Value Range   Salicylate Lvl <2.0 (*) 2.8 - 20.0 mg/dL  ACETAMINOPHEN LEVEL     Status: None   Collection Time    02/08/13 10:45 PM      Result Value Range   Acetaminophen (Tylenol), Serum <15.0  10 - 30 ug/mL   Comment:            THERAPEUTIC CONCENTRATIONS VARY     SIGNIFICANTLY. A RANGE OF 10-30     ug/mL MAY BE AN EFFECTIVE     CONCENTRATION FOR MANY PATIENTS.     HOWEVER, SOME ARE BEST TREATED     AT CONCENTRATIONS OUTSIDE THIS     RANGE.     ACETAMINOPHEN CONCENTRATIONS     >150 ug/mL AT 4 HOURS AFTER     INGESTION AND >50 ug/mL AT 12     HOURS  AFTER INGESTION ARE     OFTEN ASSOCIATED WITH TOXIC     REACTIONS.  PREGNANCY, URINE     Status: None   Collection Time    02/08/13 11:40 PM      Result Value Range   Preg Test, Ur NEGATIVE  NEGATIVE   Comment:            THE SENSITIVITY OF THIS     METHODOLOGY IS >20 mIU/mL.   Psychological Evaluations:  Assessment:   DSM5: Schizophrenia Disorders:  NA Obsessive-Compulsive Disorders:  NA Trauma-Stressor Disorders:  NA Substance/Addictive Disorders:  Alcohol Related Disorder - Moderate (303.90) and Alcohol Withdrawal (291.81) Depressive Disorders:  Major Depressive Disorder - Moderate (296.22)  AXIS I:  Alcohol dependence, Major depressive disorder AXIS II:  Deferred AXIS III:   Past Medical History  Diagnosis Date  . ASTHMA   . HEART MURMUR, HX OF   . ADD (attention deficit disorder)   . Hypertension    AXIS IV:  problems related to social environment and Alcoholism AXIS V:  11-20 some danger of hurting self or others possible OR occasionally fails to maintain minimal personal hygiene OR gross impairment in communication  Treatment Plan/Recommendations: 1. Admit for crisis management and stabilization, estimated length of stay 3-5 days.  2. Medication management to reduce current symptoms to base line and improve the patient's overall level of functioning (a). Continue librium for alcohol withdrawal symptoms. 3. Treat health problems as indicated.  4. Develop treatment plan to decrease risk of relapse upon discharge and the need for readmission.  5. Psycho-social education regarding relapse prevention and self care.  6. Health care follow up as needed for medical problems.  7. Review, reconcile, and reinstate any pertinent home medications for other health issues where appropriate. 8. Call for consults with hospitalist for any additional specialty patient care services as needed. Treatment Plan Summary: Daily contact with patient to assess and evaluate symptoms and  progress in treatment Medication management  Current Medications:  Current Facility-Administered Medications  Medication Dose Route Frequency Provider Last Rate Last Dose  . acetaminophen (TYLENOL) tablet 650 mg  650 mg Oral Q6H PRN Kristeen Mans, NP      . albuterol (PROVENTIL HFA;VENTOLIN HFA) 108 (90 BASE) MCG/ACT inhaler 2 puff  2 puff Inhalation Q6H PRN Kristeen Mans, NP      . alum & mag hydroxide-simeth (MAALOX/MYLANTA) 200-200-20 MG/5ML suspension 30 mL  30  mL Oral Q4H PRN Kristeen Mans, NP      . budesonide-formoterol (SYMBICORT) 160-4.5 MCG/ACT inhaler 2 puff  2 puff Inhalation BID Kristeen Mans, NP      . magnesium hydroxide (MILK OF MAGNESIA) suspension 30 mL  30 mL Oral Daily PRN Kristeen Mans, NP      . metoprolol succinate (TOPROL-XL) 24 hr tablet 50 mg  50 mg Oral q morning - 10a Kristeen Mans, NP      . traZODone (DESYREL) tablet 50 mg  50 mg Oral QHS PRN Kristeen Mans, NP        Observation Level/Precautions:  15 minute checks  Laboratory:  Reviewed ED lab findings on file  Psychotherapy:  Group sessions  Medications: See medication lists   Consultations:  As needed  Discharge Concerns: Maintaining sobriety   Estimated LOS: 2-4 days  Other:     I certify that inpatient services furnished can reasonably be expected to improve the patient's condition.   Sanjuana Kava, PmHNP, FNP-BC 11/22/201411:19 AM

## 2013-02-09 NOTE — Progress Notes (Signed)
Patient ID: Aimee Thompson, female   DOB: 01-Feb-1971, 42 y.o.   MRN: [id number] Pt. Admitted to unit, no signs of distress or current complaints. Pt. Stated that she had SI with thoughts of cutting herself. Pt. Stated that she recently separated from her wife and it is a big stressor point for her. Pt stated that her wife cheated on her, and shortly after finding out she became depressed and started drinking heavily. Pt. Stated she drinks a few beers and glass of wine a day. Pt stated she is currently unemployed. Pt stated she has situational depression and anxiety problems, but does not currently feel depressed all the time. Pt. Is calm and cooperative. Depressed mood and flat affect. Denies SI/HI/AVH and pain. Pt. Stated she is just really tired and wants to go to bed. Pt introduced to unit and policies/ rules of the unit. Pt offered food and beverage. Pt. Remains safe on unit

## 2013-02-09 NOTE — [facility] Suicide Risk Assessment (Signed)
Suicide Risk Assessment  Admission Assessment     Nursing information obtained from:  Patient Demographic factors:  Caucasian;Gay, lesbian, or bisexual orientation;Unemployed Current Mental Status:  NA Loss Factors:  Loss of significant relationship Historical Factors:  NA Risk Reduction Factors:  Responsible for children under 42 years of age;Positive social support  CLINICAL FACTORS:   Depression:   Anhedonia Comorbid alcohol abuse/dependence Hopelessness Dysthymia More than one psychiatric diagnosis Unstable or Poor Therapeutic Relationship  COGNITIVE FEATURES THAT CONTRIBUTE TO RISK:  Closed-mindedness Polarized thinking    SUICIDE RISK:   Moderate:  Frequent suicidal ideation with limited intensity, and duration, some specificity in terms of plans, no associated intent, good self-control, limited dysphoria/symptomatology, some risk factors present, and identifiable protective factors, including available and accessible social support.  PLAN OF CARE:  I certify that inpatient services furnished can reasonably be expected to improve the patient's condition.  Aimee Thompson 02/09/2013, 10:11 AM

## 2013-02-09 NOTE — [facility] Group Notes (Signed)
[facility] Group Notes:  (Clinical Social Work)  02/09/2013     10-11AM  Summary of Progress/Problems:   The main focus of today's process group was for the patient to identify ways in which they have in the past sabotaged their own recovery. Motivational Interviewing was utilized to ask the group members what they get out of their substance use, and what reasons they may have for wanting to change.  The Stages of Change were explained using a handout, and patients identified where they currently are with regard to stages of change.  The patient was late to group, but caught up quickly on the topic of self-sabotage.  She expressed that she drinks alcohol to stay numb due to a current separation and her increasing depression, but she said this is not a good thing for her 3 children to see.  She feels she is close to making a decision about whether to change.  She sat with her eyes downcast throughout group as well as a flat affect.  Type of Therapy:  Group Therapy - Process   Participation Level:  Minimal  Participation Quality:  Attentive  Affect:  Depressed and Flat  Cognitive:  Alert  Insight:  Limited  Engagement in Therapy:  Limited  Modes of Intervention:  Education, Support and Processing, Motivational Interviewing  Ambrose Mantle, LCSW 02/09/2013, 12:50 PM

## 2013-02-09 NOTE — [facility] Group Notes (Signed)
[facility] Group Notes:  (Nursing/MHT/Case Management/Adjunct)  Date:  02/09/2013  Time:  1:49 PM  Type of Therapy:  Psychoeducational Skills  Participation Level:  Did Not Attend    Aimee Thompson 02/09/2013, 1:49 PM

## 2013-02-09 NOTE — [facility] Counselor (Signed)
Adult Comprehensive Assessment  Patient ID: Aimee Thompson, female   DOB: Apr 26, 1970, 42 y.o.   MRN: [id number]  Information Source: Information source: Patient  Current Stressors:  Educational / Learning stressors: Trying to finish massage therapy degree, is stressed she has not yet been able to complete it Employment / Job issues: Denies, unemployed currently Family Relationships: Extreme stress, is separated from her wife, is facing the possibility that she may not be the one who stays in the house Financial / Lack of resources (include bankruptcy): Has no job, no income Housing / Lack of housing: May not be able to stay in the house due to separation Physical health (include injuries & life threatening diseases): Trying to work out Social relationships: They have lost friends over the past two years, are making new friends. Substance abuse: Alcohol, has been relying on it to be numb from her emotions.  In the last few months has become more of a crutch. Bereavement / Loss: The relationship with her wife  Living/Environment/Situation:  Living Arrangements: Spouse/significant other (switches between home where her wife is and where the 3 children are, and her parents' ) Living conditions (as described by patient or guardian): Safe, running water How long has patient lived in current situation?: 4-6 months going between the two homes; previously 16 years What is atmosphere in current home: Chaotic;Comfortable ("home")  Family History:  Marital status: Separated Separated, when?: 4-6 months ago separated physically, are taking turns in the home with the children.  Paperwork for separation has not yet beend one. What types of issues is patient dealing with in the relationship?: Patient's wife cheated on her with best friend, and since then has not apologized fully, acts angry. Does patient have children?: Yes How many children?: 3 (18, 10, 4) How is patient's relationship with their  children?: Great relationship with all.  Childhood History:  By whom was/is the patient raised?: Both parents Description of patient's relationship with caregiver when they were a child: Mother - good relationship; Father - good, he coached soccer, traveled a lot Patient's description of current relationship with people who raised him/her: Mother and father still together - still a good relationship.  Patient switching between her home with kiids and parents' home, and is continuing to make attempts to get parents to observe boundaries since she is an adult. Does patient have siblings?: Yes Number of Siblings: 2 (brothers) Description of patient's current relationship with siblings: Good with both Did patient suffer any verbal/emotional/physical/sexual abuse as a child?: No Did patient suffer from severe childhood neglect?: No Has patient ever been sexually abused/assaulted/raped as an adolescent or adult?: No Was the patient ever a victim of a crime or a disaster?: No Witnessed domestic violence?: No Has patient been effected by domestic violence as an adult?: No  Education:  Highest grade of school patient has completed: Associates degree Currently a student?: Yes If yes, how has current illness impacted academic performance: Has not affected. 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 If attempted suicide, did drugs/alcohol play a role in this?: No Alcohol/Substance Abuse Treatment Hx: Denies past history Has alcohol/substance abuse ever caused legal problems?: No  Social Support System:   Patient's Community Support System: Good Describe Community Support System: Family and friends Type of faith/religion: Episcopal/Christian How does patient's faith help to cope with current illness?: Doesn't really  Leisure/Recreation:   Leisure and Hobbies: Psychiatric nurse fit, home improvement projects, Clinical cytogeneticist projects, cook  Strengths/Needs:   What things does the patient do well?: The things listed above. In what areas does patient struggle / problems for patient: Finding balance, finding herself again, accepting that her relationship is over.  Discharge Plan:   Does patient have access to transportation?: Yes Will patient be returning to same living situation after discharge?: Yes Currently receiving community mental health services: Yes (From Whom) Ruffin Frederick, [facility], for counseling; PCP Oliver Barre at [facility] is prescribing antidepressant) If no, would patient like referral for services when discharged?: Yes (What county?) (See above for current providers, patient wants to return to them) Does patient have financial barriers related to discharge medications?: No  Summary/Recommendations:   Summary and Recommendations (to be completed by the evaluator): This is a 42yo Caucasian femalewho was hospitalized due to increased depression and suicidal ideation with a plan to cut her wrist, having held a knife for 5 hours the day of admission.  She drinks alcohol daily, a 6-pack or a bottle of wine, and denies that it is out of control.  She is currently a Consulting civil engineer in massage therapy, is unemployed and being supported by her estranged wife.   They are taking turns about every 4 days living in their house with their 3 children.  When she is not there, she is living with her parents, with whom she has a good relationship although she has to work to maintain boundaries with them.  She sees Ruffin Frederick at [facility] for therapy and her Primary Care Physician Oliver Barre at [facility] does her prescriptions for antidepressants.  She would benefit from safety monitoring, medication evaluation, psychoeducation, group therapy, and discharge planning to link with ongoing resources.  Sarina Ser. 02/09/2013

## 2013-02-10 NOTE — Progress Notes (Signed)
D: pt stated that today was a very good day. Pt stated " i felt more like myself today. i wasn't groggy and felt more awake throughout the day." pt stated she spoke with her wife and parents today and their conversations went well. Pt. Is calm and cooperative. Appropriate to situation. Pt. Sitting in dayroom reading. Denies SI/HI/AVH. Denies pain A: q 15 min safety checks. 1:1 time offered. Medications given R: pt remains safe on unit. No complaints or signs of distress at this time.

## 2013-02-10 NOTE — Progress Notes (Signed)
[facility] Group Notes:  (Nursing/MHT/Case Management/Adjunct)  Date:  02/10/2013  Time:  8:00 pm  Type of Therapy:  Psychoeducational Skills  Participation Level:  Active  Participation Quality:  Appropriate  Affect:  Appropriate  Cognitive:  Appropriate  Insight:  Good  Engagement in Group:  Engaged  Modes of Intervention:  Education  Summary of Progress/Problems: The patient shared with the group that she had a really good day. She explained that she felt less drowsy today. In addition, she took the time to relax more than yesterday. In terms of the theme for the day, her parents and close friends will serve as her support system.  Hazle Coca S 02/10/2013, 11:24 PM

## 2013-02-10 NOTE — Progress Notes (Signed)
Adult Psychoeducational Group Note  Date:  02/10/2013 Time:  3:53 PM  Group Topic/Focus:  Therapeutic Activity  Participation Level:  Active  Participation Quality:  Appropriate  Affect:  Appropriate  Cognitive:  Appropriate  Insight: Appropriate  Engagement in Group:  Engaged  Modes of Intervention:  Activity  Elijio Miles 02/10/2013, 3:53 PM

## 2013-02-10 NOTE — Progress Notes (Signed)
D-Patient is quiet,guarded and isolative to room. She is attending groups.  Declined noon medications.  Denies SI. No physical complaints and no prn medications requested.  A-Support and encouragement offered. Positive feedback. Continue current POC and evaluation of treatment goals.  Continue 15' checks for safety.  R- Safety maintained.

## 2013-02-10 NOTE — [facility] Group Notes (Signed)
[facility] Group Notes:  (Clinical Social Work)  02/10/2013  10:00-11:00AM  Summary of Progress/Problems:   The main focus of today's process group was to   identify the patient's current support system and decide on other supports that can be put in place.  The picture on workbook was used to discuss why additional supports are needed, and a hand-out was distributed with four definitions/levels of support, then used to talk about how patients have given and received all different kinds of support.  An emphasis was placed on using counselor, doctor, therapy groups, 12-step groups, and problem-specific support groups to expand supports.  The patient identified one additional support as being a therapist and a doctor, in addition to her friends, parents, and children aged 19, 14 and 4.  Type of Therapy:  Process Group with Motivational Interviewing  Participation Level:  Active  Participation Quality:  Attentive, Sharing and Supportive  Affect:  Blunted  Cognitive:  Appropriate and Oriented  Insight:  Engaged  Engagement in Therapy:  Engaged  Modes of Intervention:   Education, Support and Processing, Activity  [employer], LCSW 02/10/2013, 12:15pm

## 2013-02-10 NOTE — Progress Notes (Signed)
[facility] MD Progress Note  02/10/2013 3:08 PM Aimee Thompson  MRN:  [id number]  Subjective:  Aimee Thompson reports, "I feel better today. I'm now feeling like I'm in a better place to get my mind right. I feel very calm. I was able to talk to my parents and my wife earlier today. I'm learning to accept the inevitable in my relationship".  Diagnosis:   DSM5: Schizophrenia Disorders:  NA Obsessive-Compulsive Disorders:  NA Trauma-Stressor Disorders:  NA Substance/Addictive Disorders:  Alcohol dependence Depressive Disorders:  Major Depressive Disorder - Moderate (296.22)  Axis I: Alcohol dependence, Major Depressive Disorder - Moderate (296.22) Axis II: Deferred Axis III:  Past Medical History  Diagnosis Date  . ASTHMA   . HEART MURMUR, HX OF   . ADD (attention deficit disorder)   . Hypertension    Axis IV: Relationship issues Axis V: 41-50 serious symptoms  ADL's:  Intact  Sleep: Good  Appetite:  Good  Suicidal Ideation:  Plan:  Denies Intent:  Denies Means:  Denies Homicidal Ideation:  Plan:  Denies Intent:  Denies Means:  Denies AEB (as evidenced by):  Psychiatric Specialty Exam: Review of Systems  Constitutional: Negative.   HENT: Negative.   Eyes: Negative.   Respiratory: Negative.   Cardiovascular: Negative.   Gastrointestinal: Negative.   Genitourinary: Negative.   Musculoskeletal: Negative.   Skin: Negative.   Neurological: Negative.   Endo/Heme/Allergies: Negative.   Psychiatric/Behavioral: Positive for depression and substance abuse (Alcoholism). Negative for suicidal ideas, hallucinations and memory loss. The patient is nervous/anxious and has insomnia (Currently being stabilized with medication).     Blood pressure 119/77, pulse 61, temperature 97.5 F (36.4 C), temperature source Oral, resp. rate 16, height 5\' 4"  (1.626 m), weight 54.885 kg (121 lb), last menstrual period 01/25/2013.Body mass index is 20.76 kg/(m^2).  General Appearance: Well  Groomed  Patent attorney::  Good  Speech:  Clear and Coherent  Volume:  Normal  Mood:  "I feel better today"  Affect:  Congruent  Thought Process:  Coherent and Intact  Orientation:  Full (Time, Place, and Person)  Thought Content:  WDL  Suicidal Thoughts:  No  Homicidal Thoughts:  No  Memory:  Immediate;   Good Recent;   Good Remote;   Good  Judgement:  Fair  Insight:  Fair  Psychomotor Activity:  Normal  Concentration:  Good  Recall:  Good  Akathisia:  No  Handed:  Right  AIMS (if indicated):     Assets:  Desire for Improvement  Sleep:  Number of Hours: 6.25   Current Medications: Current Facility-Administered Medications  Medication Dose Route Frequency Provider Last Rate Last Dose  . acetaminophen (TYLENOL) tablet 650 mg  650 mg Oral Q6H PRN Sanjuana Kava, NP      . albuterol (PROVENTIL HFA;VENTOLIN HFA) 108 (90 BASE) MCG/ACT inhaler 2 puff  2 puff Inhalation Q6H PRN Kristeen Mans, NP      . alum & mag hydroxide-simeth (MAALOX/MYLANTA) 200-200-20 MG/5ML suspension 30 mL  30 mL Oral Q4H PRN Sanjuana Kava, NP      . budesonide-formoterol (SYMBICORT) 160-4.5 MCG/ACT inhaler 2 puff  2 puff Inhalation BID Kristeen Mans, NP      . chlordiazePOXIDE (LIBRIUM) capsule 25 mg  25 mg Oral Q6H PRN Sanjuana Kava, NP      . chlordiazePOXIDE (LIBRIUM) capsule 25 mg  25 mg Oral TID Sanjuana Kava, NP   25 mg at 02/10/13 0920   Followed by  . [START  ON 02/11/2013] chlordiazePOXIDE (LIBRIUM) capsule 25 mg  25 mg Oral BH-qamhs Sanjuana Kava, NP       Followed by  . [START ON 02/12/2013] chlordiazePOXIDE (LIBRIUM) capsule 25 mg  25 mg Oral Daily Sanjuana Kava, NP      . citalopram (CELEXA) tablet 20 mg  20 mg Oral q morning - 10a Sanjuana Kava, NP   20 mg at 02/10/13 0920  . hydrOXYzine (ATARAX/VISTARIL) tablet 25 mg  25 mg Oral Q6H PRN Sanjuana Kava, NP      . loperamide (IMODIUM) capsule 2-4 mg  2-4 mg Oral PRN Sanjuana Kava, NP      . metoprolol succinate (TOPROL-XL) 24 hr tablet 50 mg  50 mg  Oral q morning - 10a Kristeen Mans, NP   50 mg at 02/09/13 2110  . multivitamin with minerals tablet 1 tablet  1 tablet Oral Daily Sanjuana Kava, NP   1 tablet at 02/10/13 0920  . nicotine (NICODERM CQ - dosed in mg/24 hours) patch 21 mg  21 mg Transdermal Q0600 Sanjuana Kava, NP      . ondansetron (ZOFRAN-ODT) disintegrating tablet 4 mg  4 mg Oral Q6H PRN Sanjuana Kava, NP      . thiamine (VITAMIN B-1) tablet 100 mg  100 mg Oral Daily Sanjuana Kava, NP   100 mg at 02/10/13 0920  . traZODone (DESYREL) tablet 50 mg  50 mg Oral QHS PRN Kristeen Mans, NP   50 mg at 02/09/13 2213    Lab Results:  Results for orders placed during the hospital encounter of 02/08/13 (from the past 48 hour(s))  URINE RAPID DRUG SCREEN (HOSP PERFORMED)     Status: Abnormal   Collection Time    02/08/13 10:41 PM      Result Value Range   Opiates NONE DETECTED  NONE DETECTED   Cocaine NONE DETECTED  NONE DETECTED   Benzodiazepines NONE DETECTED  NONE DETECTED   Amphetamines POSITIVE (*) NONE DETECTED   Tetrahydrocannabinol NONE DETECTED  NONE DETECTED   Barbiturates NONE DETECTED  NONE DETECTED   Comment:            DRUG SCREEN FOR MEDICAL PURPOSES     ONLY.  IF CONFIRMATION IS NEEDED     FOR ANY PURPOSE, NOTIFY LAB     WITHIN 5 DAYS.                LOWEST DETECTABLE LIMITS     FOR URINE DRUG SCREEN     Drug Class       Cutoff (ng/mL)     Amphetamine      1000     Barbiturate      200     Benzodiazepine   200     Tricyclics       300     Opiates          300     Cocaine          300     THC              50  CBC     Status: Abnormal   Collection Time    02/08/13 10:45 PM      Result Value Range   WBC 5.4  4.0 - 10.5 K/uL   RBC 3.98  3.87 - 5.11 MIL/uL   Hemoglobin 13.7  12.0 - 15.0 g/dL   HCT 40.9  81.1 - 91.4 %  MCV 97.5  78.0 - 100.0 fL   MCH 34.4 (*) 26.0 - 34.0 pg   MCHC 35.3  30.0 - 36.0 g/dL   RDW 40.9  81.1 - 91.4 %   Platelets 250  150 - 400 K/uL  COMPREHENSIVE METABOLIC PANEL      Status: Abnormal   Collection Time    02/08/13 10:45 PM      Result Value Range   Sodium 137  135 - 145 mEq/L   Potassium 3.8  3.5 - 5.1 mEq/L   Chloride 100  96 - 112 mEq/L   CO2 24  19 - 32 mEq/L   Glucose, Bld 113 (*) 70 - 99 mg/dL   BUN 17  6 - 23 mg/dL   Creatinine, Ser 7.82  0.50 - 1.10 mg/dL   Calcium 8.6  8.4 - 95.6 mg/dL   Total Protein 7.3  6.0 - 8.3 g/dL   Albumin 3.7  3.5 - 5.2 g/dL   AST 40 (*) 0 - 37 U/L   Comment: SLIGHT HEMOLYSIS     HEMOLYSIS AT THIS LEVEL MAY AFFECT RESULT   ALT 25  0 - 35 U/L   Alkaline Phosphatase 71  39 - 117 U/L   Total Bilirubin 0.3  0.3 - 1.2 mg/dL   GFR calc non Af Amer 74 (*) >90 mL/min   GFR calc Af Amer 86 (*) >90 mL/min   Comment: (NOTE)     The eGFR has been calculated using the CKD EPI equation.     This calculation has not been validated in all clinical situations.     eGFR's persistently <90 mL/min signify possible Chronic Kidney     Disease.  ETHANOL     Status: Abnormal   Collection Time    02/08/13 10:45 PM      Result Value Range   Alcohol, Ethyl (B) 251 (*) 0 - 11 mg/dL   Comment:            LOWEST DETECTABLE LIMIT FOR     SERUM ALCOHOL IS 11 mg/dL     FOR MEDICAL PURPOSES ONLY  SALICYLATE LEVEL     Status: Abnormal   Collection Time    02/08/13 10:45 PM      Result Value Range   Salicylate Lvl <2.0 (*) 2.8 - 20.0 mg/dL  ACETAMINOPHEN LEVEL     Status: None   Collection Time    02/08/13 10:45 PM      Result Value Range   Acetaminophen (Tylenol), Serum <15.0  10 - 30 ug/mL   Comment:            THERAPEUTIC CONCENTRATIONS VARY     SIGNIFICANTLY. A RANGE OF 10-30     ug/mL MAY BE AN EFFECTIVE     CONCENTRATION FOR MANY PATIENTS.     HOWEVER, SOME ARE BEST TREATED     AT CONCENTRATIONS OUTSIDE THIS     RANGE.     ACETAMINOPHEN CONCENTRATIONS     >150 ug/mL AT 4 HOURS AFTER     INGESTION AND >50 ug/mL AT 12     HOURS AFTER INGESTION ARE     OFTEN ASSOCIATED WITH TOXIC     REACTIONS.  PREGNANCY, URINE      Status: None   Collection Time    02/08/13 11:40 PM      Result Value Range   Preg Test, Ur NEGATIVE  NEGATIVE   Comment:            THE SENSITIVITY  OF THIS     METHODOLOGY IS >20 mIU/mL.    Physical Findings: AIMS: Facial and Oral Movements Muscles of Facial Expression: None, normal Lips and Perioral Area: None, normal Jaw: None, normal Tongue: None, normal,Extremity Movements Upper (arms, wrists, hands, fingers): None, normal Lower (legs, knees, ankles, toes): None, normal, Trunk Movements Neck, shoulders, hips: None, normal, Overall Severity Severity of abnormal movements (highest score from questions above): None, normal Incapacitation due to abnormal movements: None, normal Patient's awareness of abnormal movements (rate only patient's report): No Awareness, Dental Status Current problems with teeth and/or dentures?: No Does patient usually wear dentures?: No  CIWA:  CIWA-Ar Total: 0 COWS:  COWS Total Score: 0  Treatment Plan Summary: Daily contact with patient to assess and evaluate symptoms and progress in treatment Medication management  Plan: Supportive approach/coping skills/relapse prevention. Encouraged out of room, participation in group sessions and application of coping skills when distressed. Will continue to monitor response to/adverse effects of medications in use to assure effectiveness. Continue to monitor mood, behavior and interaction with staff and other patients. Continue current plan of care.  Medical Decision Making Problem Points:  Review of last therapy session (1) and Review of psycho-social stressors (1) Data Points:  Review of medication regiment & side effects (2) Review of new medications or change in dosage (2)  I certify that inpatient services furnished can reasonably be expected to improve the patient's condition.   Armandina Stammer I, PMHNP-BC 02/10/2013, 3:08 PM

## 2013-02-10 NOTE — [facility] Group Notes (Signed)
[facility] Group Notes:  (Nursing/MHT/Case Management/Adjunct)  Date:  02/10/2013  Time:  1:53 PM  Type of Therapy:  Nurse Education  Participation Level:  Active  Participation Quality:  Appropriate and Attentive  Affect:  Appropriate  Cognitive:  Appropriate  Insight:  Good  Engagement in Group:  Developing/Improving  Modes of Intervention:  Exploration  Summary of Progress/Problems:  Cresenciano Lick 02/10/2013, 1:53 PM

## 2013-02-11 DIAGNOSIS — F321 Major depressive disorder, single episode, moderate: Secondary | ICD-10-CM

## 2013-02-11 DIAGNOSIS — F1020 Alcohol dependence, uncomplicated: Principal | ICD-10-CM

## 2013-02-11 MED ORDER — ALBUTEROL SULFATE HFA 108 (90 BASE) MCG/ACT IN AERS: 2.0000 | INHALATION_SPRAY | Freq: Four times a day (QID) | RESPIRATORY_TRACT | Status: DC | PRN

## 2013-02-11 MED ORDER — CITALOPRAM HYDROBROMIDE 20 MG PO TABS: 20.0000 mg | ORAL_TABLET | Freq: Every morning | ORAL | Status: DC

## 2013-02-11 MED ORDER — BUDESONIDE-FORMOTEROL FUMARATE 160-4.5 MCG/ACT IN AERO: 2.0000 | INHALATION_SPRAY | Freq: Two times a day (BID) | RESPIRATORY_TRACT | Status: DC

## 2013-02-11 MED ORDER — CITALOPRAM HYDROBROMIDE 20 MG PO TABS: 30.0000 mg | ORAL_TABLET | Freq: Every morning | ORAL | Status: DC

## 2013-02-11 MED ORDER — TRAZODONE HCL 50 MG PO TABS: 50.0000 mg | ORAL_TABLET | Freq: Every evening | ORAL | Status: DC | PRN

## 2013-02-11 MED ORDER — METOPROLOL SUCCINATE ER 50 MG PO TB24: 50.0000 mg | ORAL_TABLET | Freq: Every morning | ORAL | Status: DC

## 2013-02-11 MED ORDER — CITALOPRAM HYDROBROMIDE 20 MG PO TABS
30.0000 mg | ORAL_TABLET | Freq: Every day | ORAL | Status: DC
  Filled 2013-02-11: qty 21

## 2013-02-11 NOTE — Progress Notes (Signed)
Adult Psychoeducational Group Note  Date:  02/11/2013 Time:  6:55 PM  Group Topic/Focus:  Self Care:   The focus of this group is to help patients understand the importance of self-care in order to improve or restore emotional, physical, spiritual, interpersonal, and financial health.  Participation Level:  Active  Participation Quality:  Appropriate, Attentive and Sharing  Affect:  Appropriate  Cognitive:  Appropriate  Insight: Appropriate and Good  Engagement in Group:  Developing/Improving and Engaged  Modes of Intervention:  Activity, Discussion, Education, Socialization and Support  Additional Comments:  Aimee Thompson attended group and participated and shared during group. Patient shared what self-care meant to pt. Patient was asked to complete the self-care assessment in the areas of care. Patient focused on five areas, relationship, psychological, emotional, spiritual, and physical care. Patient rated how well or poorly patient applied topics to pt life. Patient shared what were strengths and weaknesses and making a goal on improving care that needs attention.   Aimee Thompson 02/11/2013, 6:55 PM

## 2013-02-11 NOTE — [facility] Group Notes (Signed)
[facility] LCSW Aftercare Discharge Planning Group Note   02/11/2013 8:45 AM  Participation Quality:  Alert and Appropriate   Mood/Affect:  Appropriate and Calm  Depression Rating:  0  Anxiety Rating:  1  Thoughts of Suicide:  Pt denies SI/HI  Will you contract for safety?   Yes  Current AVH:  Pt denies  Plan for Discharge/Comments:  Pt attended discharge planning group and actively participated in group.  CSW provided pt with today's workbook.  Pt reports feeling ready to d/c.  Pt states that she plans to organize her life.  Pt states that she is going through a separation with her wife.  Pt states that she will return home in [place] and family is supportive.  Pt will follow up with Victorino Dike at [facility] and will need a referral for a psychiatrist.  CSW will assess for appropriate referral.  No further needs voiced by pt at this time.   Transportation Means: Pt reports access to transportation - family will pick pt up at d/c  Supports: No supports mentioned at this time  Reyes Ivan, LCSW 02/11/2013 9:57 AM

## 2013-02-11 NOTE — [facility] Group Notes (Signed)
[facility] Group Notes:  (Nursing/MHT/Case Management/Adjunct)  Date:  02/11/2013  Time:  11:56 AM  Type of Therapy:  Therapeutic Activity  Participation Level:  Active  Participation Quality:  Appropriate  Affect:  Appropriate  Cognitive:  Alert and Oriented  Insight:  Appropriate  Engagement in Group:  Developing/Improving  Modes of Intervention:  Activity  Summary of Progress/Problems: Pt participated appropriately and interacted with peers during group.  Beatrix Shipper 02/11/2013, 11:56 AM

## 2013-02-11 NOTE — Progress Notes (Signed)
Pt d/c from hospital with her father. All items returned. D/C instructions given, prescriptions given and samples given. Pt denies si and hi.

## 2013-02-11 NOTE — Progress Notes (Signed)
[facility] Adult Case Management Discharge Plan :  Will you be returning to the same living situation after discharge: Yes,  returning home At discharge, do you have transportation home?:Yes,  family will pick pt up Do you have the ability to pay for your medications:Yes,  access to meds  Release of information consent forms completed and in the chart;  Patient's signature needed at discharge.  Patient to Follow up at: Follow-up Information   Follow up with [facility] . (with Victorino Dike for therapy.  CSW left a voicemail message to get the appointment but not obtained before discharge.  Call to schedule this appointment as soon as you discharge. )    Contact information:   [address] [place], [place] [address] Phone: [phone] Fax: [phone]      Follow up with [facility] On 04/08/2013. (Appointment scheduled at 11:00 am with Dr. Lolly Mustache for medication management.  Paperwork will be mailed to you to complete prior to this appointment.)    Contact information:   [address] [place], [place] [address] Phone: [phone]      Patient denies SI/HI:   Yes,  denies SI/HI    Safety Planning and Suicide Prevention discussed:  Yes,  discussed with pt and pt's mother.  See suicide prevention education note.   Carmina Miller 02/11/2013, 11:25 AM

## 2013-02-11 NOTE — Tx Team (Signed)
Interdisciplinary Treatment Plan Update (Adult)  Date: 02/11/2013  Time Reviewed:  9:45 AM  Progress in Treatment: Attending groups: Yes Participating in groups:  Yes Taking medication as prescribed:  Yes Tolerating medication:  Yes Family/Significant othe contact made: Yes Patient understands diagnosis:  Yes Discussing patient identified problems/goals with staff:  Yes Medical problems stabilized or resolved:  Yes Denies suicidal/homicidal ideation: Yes Issues/concerns per patient self-inventory:  Yes Other:  New problem(s) identified: N/A  Discharge Plan or Barriers: Pt will follow up at [facility] for therapy and [facility] for medication management.   Reason for Continuation of Hospitalization: Stable to d/c today  Comments: N/A  Estimated length of stay: D/C today  For review of initial/current patient goals, please see plan of care.   Attendees: Patient:     Family:     Physician:  Dr. Dub Mikes 02/11/2013 10:42 AM   Nursing:   Dellia Cloud, RN 02/11/2013 10:42 AM   Clinical Social Worker:  Reyes Ivan, LCSW 02/11/2013 10:42 AM   Other: Onnie Boer, RN case manager 02/11/2013 10:42 AM   Other:  Trula Slade, LCSWA 02/11/2013 10:42 AM   Other:  Serena Colonel, NP 02/11/2013 10:42 AM   Other:  Roswell Miners, RN 02/11/2013 10:42 AM   Other: Waynetta Sandy, RN 02/11/2013 10:42 AM   Other:    Other:    Other:    Other:    Other:     Scribe for Treatment Team:   Carmina Miller, 02/11/2013 , 10:42 AM

## 2013-02-11 NOTE — Discharge Summary (Signed)
Physician Discharge Summary Note  Patient:  Aimee Thompson is an 42 y.o., female MRN:  [id number] DOB:  1971-03-06 Patient phone:  [phone] (home)  Patient address:   [address] [place] [address],   Date of Admission:  02/09/2013 Date of Discharge: 02/11/13  Reason for Admission: Alcohol detox  Discharge Diagnoses: Principal Problem:   Alcohol dependence  Review of Systems  Constitutional: Negative.   HENT: Negative.   Eyes: Negative.   Respiratory: Negative.   Cardiovascular: Negative.   Gastrointestinal: Negative.   Genitourinary: Negative.   Musculoskeletal: Negative.   Skin: Negative.   Endo/Heme/Allergies: Negative.   Psychiatric/Behavioral: Positive for depression (Stabilized with medication prior to discharge) and substance abuse (Alcholism). Negative for suicidal ideas, hallucinations and memory loss. The patient is nervous/anxious (Stabilized with medication prior to discharge) and has insomnia (Stabilized with medication prior to discharge).    DSM5: Schizophrenia Disorders:  NA Obsessive-Compulsive Disorders:  NA Trauma-Stressor Disorders:  NA Substance/Addictive Disorders:  Alcohol dependence Depressive Disorders:  Major Depressive Disorder - Moderate (296.22)  Axis Diagnosis:   AXIS I:  Alcohol dependence, Major Depressive Disorder - Moderate (296.22) AXIS II:  Deferred AXIS III:   Past Medical History  Diagnosis Date  . ASTHMA   . HEART MURMUR, HX OF   . ADD (attention deficit disorder)   . Hypertension    AXIS IV:  other psychosocial or environmental problems and Alcoholism, relationship problems AXIS V:  64  Level of Care:  OP  Hospital Course:  This is a 42 year old Caucasian female. Admitted to the [facility] as walk-in admit with complaints of worsening suicidal thoughts x 1 month and increased alcohol consumption. Patient reports, "I came in to this hospital last night. I have been having suicidal thoughts x 1 months. I have been  drinking heavily as well. My drinking and behavior has been out of control since the last year. My wife and I are getting separated and I'm having difficulty accepting it. But, I did not attempt suicide. I drink about 1 bottle of wine and 6 packs of beer daily. I guess I'm an alcoholic. I have had no treatment or rehab for alcoholism. I don't use drugs. I'm currently having loose stools, nausea and muscle aches".   Upon admission into this hospital, and after admission assessment/evaluation, it was determined based on the toxicology reports of blood alcohol levels of 251 that patient will need detoxification treatment protocol to re-stabilize her systems of alcohol intoxication and to combat the withdrawal symptoms as well. And her discharge plans included a referral to an outpatient psychiatric clinic and counseling facility to continue psychiatric treatment and counseling services. [name] was then started on Librium detoxification treatment protocol for her alcohol intoxication. She was also enrolled in group counseling sessions and activities where she was counseled and learned coping skills that should help her after discharge to cope better, manage her substance abuse problems to maintain a much longer sobriety. She also was enrolled and attended AA/NA meetings being offered and held on this unit. [name] presented other previously existing and or identifiable medical conditions that required treatment and or monitoring. She received medication management for all those health issues as well. She was monitored closely for any potential problems that may arise as a result of and or during detoxification treatment. Patient tolerated her treatment regimen and detoxification treatment without any significant adverse effects and or reactions.  Patient attended treatment team meeting this am and met with the team. Her  reason for admission, present symptoms, substance abuse issues, response to treatment  and discharge plans discussed. Patient endorsed that she is doing well and stable for discharge to pursue the next phase of her substance abuse treatment. It was agreed upon that she resume counseling services at the [facility] with Victorino Dike on 02/19/13 at 11:00 am and for medication management/routine psychiatric care, she will be receiving these services at the [facility] health outpatient clinic with Dr. Lolly Mustache on 04/08/13 at 11:00 am. The addresses, dates, times and contact information for these appointments provided for patient in writing.  Marland Kitchen Upon discharge, patient adamantly denies any suicidal, homicidal ideations, auditory, visual hallucinations, delusional thoughts and or withdrawal symptoms. Patient left [facility] with all personal belongings in no apparent distress. she received 2 weeks worth supply samples of her [facility] discharge medications. Transportation per family.  Consults:  psychiatry  Significant Diagnostic Studies:  labs: CBC with diff, CMP, UDS, Toxicology tests, U/A  Discharge Vitals:   Blood pressure 120/73, pulse 55, temperature 98.3 F (36.8 C), temperature source Oral, resp. rate 16, height 5\' 4"  (1.626 m), weight 54.885 kg (121 lb), last menstrual period 01/25/2013. Body mass index is 20.76 kg/(m^2). Lab Results:   Results for orders placed during the hospital encounter of 02/08/13 (from the past 72 hour(s))  URINE RAPID DRUG SCREEN (HOSP PERFORMED)     Status: Abnormal   Collection Time    02/08/13 10:41 PM      Result Value Range   Opiates NONE DETECTED  NONE DETECTED   Cocaine NONE DETECTED  NONE DETECTED   Benzodiazepines NONE DETECTED  NONE DETECTED   Amphetamines POSITIVE (*) NONE DETECTED   Tetrahydrocannabinol NONE DETECTED  NONE DETECTED   Barbiturates NONE DETECTED  NONE DETECTED   Comment:            DRUG SCREEN FOR MEDICAL PURPOSES     ONLY.  IF CONFIRMATION IS NEEDED     FOR ANY PURPOSE, NOTIFY LAB     WITHIN 5 DAYS.                LOWEST  DETECTABLE LIMITS     FOR URINE DRUG SCREEN     Drug Class       Cutoff (ng/mL)     Amphetamine      1000     Barbiturate      200     Benzodiazepine   200     Tricyclics       300     Opiates          300     Cocaine          300     THC              50  CBC     Status: Abnormal   Collection Time    02/08/13 10:45 PM      Result Value Range   WBC 5.4  4.0 - 10.5 K/uL   RBC 3.98  3.87 - 5.11 MIL/uL   Hemoglobin 13.7  12.0 - 15.0 g/dL   HCT 40.9  81.1 - 91.4 %   MCV 97.5  78.0 - 100.0 fL   MCH 34.4 (*) 26.0 - 34.0 pg   MCHC 35.3  30.0 - 36.0 g/dL   RDW 78.2  95.6 - 21.3 %   Platelets 250  150 - 400 K/uL  COMPREHENSIVE METABOLIC PANEL     Status: Abnormal   Collection Time  02/08/13 10:45 PM      Result Value Range   Sodium 137  135 - 145 mEq/L   Potassium 3.8  3.5 - 5.1 mEq/L   Chloride 100  96 - 112 mEq/L   CO2 24  19 - 32 mEq/L   Glucose, Bld 113 (*) 70 - 99 mg/dL   BUN 17  6 - 23 mg/dL   Creatinine, Ser 8.46  0.50 - 1.10 mg/dL   Calcium 8.6  8.4 - 96.2 mg/dL   Total Protein 7.3  6.0 - 8.3 g/dL   Albumin 3.7  3.5 - 5.2 g/dL   AST 40 (*) 0 - 37 U/L   Comment: SLIGHT HEMOLYSIS     HEMOLYSIS AT THIS LEVEL MAY AFFECT RESULT   ALT 25  0 - 35 U/L   Alkaline Phosphatase 71  39 - 117 U/L   Total Bilirubin 0.3  0.3 - 1.2 mg/dL   GFR calc non Af Amer 74 (*) >90 mL/min   GFR calc Af Amer 86 (*) >90 mL/min   Comment: (NOTE)     The eGFR has been calculated using the CKD EPI equation.     This calculation has not been validated in all clinical situations.     eGFR's persistently <90 mL/min signify possible Chronic Kidney     Disease.  ETHANOL     Status: Abnormal   Collection Time    02/08/13 10:45 PM      Result Value Range   Alcohol, Ethyl (B) 251 (*) 0 - 11 mg/dL   Comment:            LOWEST DETECTABLE LIMIT FOR     SERUM ALCOHOL IS 11 mg/dL     FOR MEDICAL PURPOSES ONLY  SALICYLATE LEVEL     Status: Abnormal   Collection Time    02/08/13 10:45 PM      Result  Value Range   Salicylate Lvl <2.0 (*) 2.8 - 20.0 mg/dL  ACETAMINOPHEN LEVEL     Status: None   Collection Time    02/08/13 10:45 PM      Result Value Range   Acetaminophen (Tylenol), Serum <15.0  10 - 30 ug/mL   Comment:            THERAPEUTIC CONCENTRATIONS VARY     SIGNIFICANTLY. A RANGE OF 10-30     ug/mL MAY BE AN EFFECTIVE     CONCENTRATION FOR MANY PATIENTS.     HOWEVER, SOME ARE BEST TREATED     AT CONCENTRATIONS OUTSIDE THIS     RANGE.     ACETAMINOPHEN CONCENTRATIONS     >150 ug/mL AT 4 HOURS AFTER     INGESTION AND >50 ug/mL AT 12     HOURS AFTER INGESTION ARE     OFTEN ASSOCIATED WITH TOXIC     REACTIONS.  PREGNANCY, URINE     Status: None   Collection Time    02/08/13 11:40 PM      Result Value Range   Preg Test, Ur NEGATIVE  NEGATIVE   Comment:            THE SENSITIVITY OF THIS     METHODOLOGY IS >20 mIU/mL.    Physical Findings: AIMS: Facial and Oral Movements Muscles of Facial Expression: None, normal Lips and Perioral Area: None, normal Jaw: None, normal Tongue: None, normal,Extremity Movements Upper (arms, wrists, hands, fingers): None, normal Lower (legs, knees, ankles, toes): None, normal, Trunk Movements Neck, shoulders, hips: None, normal, Overall  Severity Severity of abnormal movements (highest score from questions above): None, normal Incapacitation due to abnormal movements: None, normal Patient's awareness of abnormal movements (rate only patient's report): No Awareness, Dental Status Current problems with teeth and/or dentures?: No Does patient usually wear dentures?: No  CIWA:  CIWA-Ar Total: 0 COWS:  COWS Total Score: 0  Psychiatric Specialty Exam: See Psychiatric Specialty Exam and Suicide Risk Assessment completed by Attending Physician prior to discharge.  Discharge destination:  Home  Is patient on multiple antipsychotic therapies at discharge:  No   Has Patient had three or more failed trials of antipsychotic monotherapy by  history:  No  Recommended Plan for Multiple Antipsychotic Therapies: NA     Medication List    STOP taking these medications       amphetamine-dextroamphetamine 10 MG 24 hr capsule  Commonly known as:  ADDERALL XR     amphetamine-dextroamphetamine 25 MG 24 hr capsule  Commonly known as:  ADDERALL XR     levofloxacin 250 MG tablet  Commonly known as:  LEVAQUIN      TAKE these medications     Indication   albuterol 108 (90 BASE) MCG/ACT inhaler  Commonly known as:  PROVENTIL HFA;VENTOLIN HFA  Inhale 2 puffs into the lungs every 6 (six) hours as needed for wheezing or shortness of breath.   Indication:  Asthma, Reversible Obstructive Lung Disease     budesonide-formoterol 160-4.5 MCG/ACT inhaler  Commonly known as:  SYMBICORT  Inhale 2 puffs into the lungs 2 (two) times daily. For Shortness of breath   Indication:  Asthma, Shortness of breath     citalopram 20 MG tablet  Commonly known as:  CELEXA  Take 1.5 tablets (30 mg total) by mouth every morning. For depression   Indication:  Excessive Use of Alcohol, Depression     metoprolol succinate 50 MG 24 hr tablet  Commonly known as:  TOPROL-XL  Take 1 tablet (50 mg total) by mouth every morning. Take with or immediately following a meal: For hypertension.   Indication:  High Blood Pressure     traZODone 50 MG tablet  Commonly known as:  DESYREL  Take 1 tablet (50 mg total) by mouth at bedtime as needed for sleep.   Indication:  Trouble Sleeping       Follow-up Information   Follow up with [facility]  On 02/19/2013. (Appointment scheduled at 11:00 am with [facility] for therapy.)    Contact information:   [address] [place], [place] [address] Phone: [phone] Fax: [phone]      Follow up with [facility] On 04/08/2013. (Appointment scheduled at 11:00 am with Dr. Lolly Mustache for medication management.  Paperwork will be mailed to you to complete prior to this appointment.)    Contact  information:   [address] [place], [place] [address] Phone: [phone]    Follow-up recommendations:  Activity:  As tolerated Diet: As recommended by your primary care doctor. Keep all scheduled follow-up appointments as recommended.  Continue to work your relapse prevention plan and on the life style changes that could help you better manage your anxiety and mood disorder Comments:  Take all your medications as prescribed by your mental healthcare provider. Report any adverse effects and or reactions from your medicines to your outpatient provider promptly. Patient is instructed and cautioned to not engage in alcohol and or illegal drug use while on prescription medicines. In the event of worsening symptoms, patient is instructed to call the  crisis hotline, 911 and or go to the nearest ED for appropriate evaluation and treatment of symptoms. Follow-up with your primary care provider for your other medical issues, concerns and or health care needs.   Total Discharge Time:  Greater than 30 minutes.  Signed: Sanjuana Kava, PMHNP-BC, FNP 02/11/2013, 12:07 PM Agree with assessment and plan Reymundo Poll. Dub Mikes, M.D.

## 2013-02-11 NOTE — [facility] Suicide Risk Assessment (Signed)
[facility] INPATIENT:  Family/Significant Other Suicide Prevention Education  Suicide Prevention Education:  Education Completed; Pershing Proud - mother [phone]),  (name of family member/significant other) has been identified by the patient as the family member/significant other with whom the patient will be residing, and identified as the person(s) who will aid the patient in the event of a mental health crisis (suicidal ideations/suicide attempt).  With written consent from the patient, the family member/significant other has been provided the following suicide prevention education, prior to the and/or following the discharge of the patient.  The suicide prevention education provided includes the following:  Suicide risk factors  Suicide prevention and interventions  National Suicide Hotline telephone number  [facility] assessment telephone number  [facility] Emergency Assistance 911  [facility] and/or Residential Mobile Crisis Unit telephone number  Request made of family/significant other to:  Remove weapons (e.g., guns, rifles, knives), all items previously/currently identified as safety concern.    Remove drugs/medications (over-the-counter, prescriptions, illicit drugs), all items previously/currently identified as a safety concern.  The family member/significant other verbalizes understanding of the suicide prevention education information provided.  The family member/significant other agrees to remove the items of safety concern listed above.  Carmina Miller 02/11/2013, 11:43 AM

## 2013-02-11 NOTE — [facility] Suicide Risk Assessment (Signed)
Suicide Risk Assessment  Discharge Assessment     Demographic Factors:  Caucasian  Mental Status Per Nursing Assessment::   On Admission:  NA  Current Mental Status by Physician: In full contact with reality. There are no suicidal ideas, plans or intent. Her mood is sad, affect is appropriate. There are no S/S of withdrawal. She states she is ready to be D/C. She understands it is going to be difficult going trough the separation and divorce but expresses readiness to address it. She is going to stay at her parents. She is looking at staying there as a more permanent situation. The kids are going to stay at the house with her partner. She used to exercise and she drop it but knows she should go back to doing it as a way to deal with the mood and the anxiety. 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 She states she used to be a "social drinker" but that when all this started happening she increased her alcohol use and started depending on it. She is going to abstain from drinking altogether at least for the time being as understands is a depressant She plans to pursue psychotherapy and increase the Celexa to 30 mg   Loss Factors:.   Loss of significant relationship  Historical Factors: NA  Risk Reduction Factors:   Responsible for children under 19 years of age, Sense of responsibility to family, Living with another person, especially a relative, Positive social support and Positive coping skills or problem solving skills  Continued Clinical Symptoms:  Depression:   Comorbid alcohol abuse/dependence Alcohol/Substance Abuse/Dependencies  Cognitive Features That Contribute To Risk: None identified   Suicide Risk:  Minimal: No identifiable suicidal ideation.  Patients presenting with no risk factors but with morbid ruminations; may be classified as minimal risk based on the severity of the depressive symptoms  Discharge Diagnoses:    AXIS I:  Alcohol Dependence, Major Depression, Anxiety Disorder NOS, ADHD AXIS II:  Deferred AXIS III:   Past Medical History  Diagnosis Date  . ASTHMA   . HEART MURMUR, HX OF   . ADD (attention deficit disorder)   . Hypertension    AXIS IV:  separation, divorce AXIS V:  61-70 mild symptoms  Plan Of Care/Follow-up recommendations:  Activity:  as tolerated Diet:  regular Relapse prevention/CBT, mindfulness Continue to work on the life style changes that could help better manage your mood/anxiety:resuem exercise Increase the Celexa to 30 mg daily Is patient on multiple antipsychotic therapies at discharge:  No   Has Patient had three or more failed trials of antipsychotic monotherapy by history:  No  Recommended Plan for Multiple Antipsychotic Therapies: NA  Atiba Kimberlin A 02/11/2013, 12:49 PM

## 2013-02-13 NOTE — Progress Notes (Signed)
Patient Discharge Instructions:  After Visit Summary (AVS):   Faxed to:  02/13/13 Discharge Summary Note:   Faxed to:  02/13/13 Psychiatric Admission Assessment Note:   Faxed to:  02/13/13 Suicide Risk Assessment - Discharge Assessment:   Faxed to:  02/13/13 Faxed/Sent to the Next Level Care provider:  02/13/13 Next Level Care Provider Has Access to the EMR, 02/13/13 Faxed to [facility] @ [phone] Records provided to [facility] via [facility]/Epic access.  Jerelene Redden, 02/13/2013, 1:47 PM

## 2013-04-02 ENCOUNTER — Other Ambulatory Visit: Payer: Self-pay

## 2013-04-02 MED ORDER — CITALOPRAM HYDROBROMIDE 20 MG PO TABS: 30.0000 mg | ORAL_TABLET | Freq: Every morning | ORAL | Status: DC

## 2013-04-08 ENCOUNTER — Ambulatory Visit (HOSPITAL_COMMUNITY): Payer: Self-pay | Admitting: Psychiatry

## 2013-06-04 ENCOUNTER — Telehealth: Payer: Self-pay | Admitting: *Deleted

## 2013-06-04 MED ORDER — BUDESONIDE-FORMOTEROL FUMARATE 160-4.5 MCG/ACT IN AERO: 2.0000 | INHALATION_SPRAY | Freq: Two times a day (BID) | RESPIRATORY_TRACT | Status: DC

## 2013-06-04 NOTE — Telephone Encounter (Signed)
Pt called requesting refills on Adderall XR and Celexa.  Last OV was 01-24-13.  Please advise

## 2013-06-14 MED ORDER — AMPHETAMINE-DEXTROAMPHET ER 10 MG PO CP24: ORAL_CAPSULE | ORAL | Status: DC

## 2013-06-14 MED ORDER — CITALOPRAM HYDROBROMIDE 20 MG PO TABS: 30.0000 mg | ORAL_TABLET | Freq: Every morning | ORAL | Status: DC

## 2013-06-14 NOTE — Telephone Encounter (Signed)
celexa refilled  adderall refilled x 1 mo

## 2013-06-17 NOTE — Telephone Encounter (Signed)
Rx printed and put up front for the pt to pick-up.  Pt aware that rx is ready for pick-up.//AB/CMA

## 2013-07-01 ENCOUNTER — Ambulatory Visit: Payer: BC Managed Care – PPO | Admitting: *Deleted

## 2013-07-01 VITALS — BP 142/80

## 2013-07-01 DIAGNOSIS — I10 Essential (primary) hypertension: Secondary | ICD-10-CM

## 2013-07-01 NOTE — Progress Notes (Signed)
Pt requests BP check.  BP taken in left arm.

## 2013-07-04 ENCOUNTER — Ambulatory Visit: Payer: Self-pay | Admitting: Internal Medicine

## 2013-07-09 ENCOUNTER — Ambulatory Visit: Payer: BC Managed Care – PPO | Admitting: Internal Medicine

## 2013-07-09 DIAGNOSIS — Z0289 Encounter for other administrative examinations: Secondary | ICD-10-CM

## 2013-09-19 ENCOUNTER — Other Ambulatory Visit: Payer: Self-pay | Admitting: *Deleted

## 2013-09-19 ENCOUNTER — Other Ambulatory Visit: Payer: Self-pay | Admitting: Internal Medicine

## 2013-09-19 ENCOUNTER — Telehealth: Payer: Self-pay | Admitting: Internal Medicine

## 2013-09-19 MED ORDER — AMPHETAMINE-DEXTROAMPHET ER 10 MG PO CP24: ORAL_CAPSULE | ORAL | Status: DC

## 2013-09-19 MED ORDER — ALPRAZOLAM 0.25 MG PO TABS: 0.2500 mg | ORAL_TABLET | Freq: Two times a day (BID) | ORAL | Status: DC | PRN

## 2013-09-19 MED ORDER — METOPROLOL SUCCINATE ER 50 MG PO TB24: 50.0000 mg | ORAL_TABLET | Freq: Every morning | ORAL | Status: DC

## 2013-09-19 MED ORDER — AMPHETAMINE-DEXTROAMPHET ER 25 MG PO CP24: 25.0000 mg | ORAL_CAPSULE | ORAL | Status: DC

## 2013-09-19 NOTE — Telephone Encounter (Signed)
Done hardcopy to robin and staff

## 2013-09-19 NOTE — Telephone Encounter (Signed)
Called the patient informed hardcopy's of all medications requested are at the front desk.

## 2013-09-19 NOTE — Telephone Encounter (Signed)
toprol done erx  Others Done hardcopy to robin

## 2013-09-19 NOTE — Telephone Encounter (Signed)
Done hardcopy to robin  

## 2013-09-19 NOTE — Telephone Encounter (Signed)
Left msg on triage needing refills on her  Adderral, Xanax, and toprolol. Do not see xanax on med list.Wanting to pick-up all 3 prescriptions...Raechel Chute/lmb

## 2013-09-19 NOTE — Telephone Encounter (Signed)
Patient picked up at the front desk

## 2014-04-03 ENCOUNTER — Telehealth: Payer: Self-pay | Admitting: Internal Medicine

## 2014-04-03 MED ORDER — AMPHETAMINE-DEXTROAMPHET ER 25 MG PO CP24: 25.0000 mg | ORAL_CAPSULE | ORAL | Status: DC

## 2014-04-03 MED ORDER — AMPHETAMINE-DEXTROAMPHET ER 10 MG PO CP24: ORAL_CAPSULE | ORAL | Status: DC

## 2014-04-03 MED ORDER — METOPROLOL SUCCINATE ER 50 MG PO TB24: 50.0000 mg | ORAL_TABLET | Freq: Every morning | ORAL | Status: DC

## 2014-04-03 NOTE — Telephone Encounter (Signed)
Done hardcopy to robin  

## 2014-04-03 NOTE — Telephone Encounter (Signed)
BP med. Refilled.  Will forward request for adderall to PCP

## 2014-04-03 NOTE — Telephone Encounter (Signed)
Called the patient left a detailed message hardcopy of Adderall is ready for pickup at the front desk. 

## 2014-04-03 NOTE — Telephone Encounter (Signed)
Is requesting refill script for adderall and bp med.  BP med needs to be sent to [facility] on [address].

## 2014-07-01 ENCOUNTER — Other Ambulatory Visit: Payer: Self-pay | Admitting: Family

## 2014-07-01 ENCOUNTER — Ambulatory Visit (INDEPENDENT_AMBULATORY_CARE_PROVIDER_SITE_OTHER)
Admission: RE | Admit: 2014-07-01 | Discharge: 2014-07-01 | Disposition: A | Discharge status: Home / Self Care | Payer: BLUE CROSS/BLUE SHIELD | Source: Ambulatory Visit | Attending: Family | Admitting: Family

## 2014-07-01 ENCOUNTER — Encounter: Payer: Self-pay | Admitting: Family

## 2014-07-01 ENCOUNTER — Ambulatory Visit (INDEPENDENT_AMBULATORY_CARE_PROVIDER_SITE_OTHER): Payer: BLUE CROSS/BLUE SHIELD | Admitting: Family

## 2014-07-01 VITALS — BP 142/90 | HR 72 | Temp 98.1°F | Resp 18 | Ht 62.5 in | Wt 124.4 lb

## 2014-07-01 DIAGNOSIS — M79641 Pain in right hand: Secondary | ICD-10-CM

## 2014-07-01 MED ORDER — TRAMADOL HCL 50 MG PO TABS: 50.0000 mg | ORAL_TABLET | Freq: Three times a day (TID) | ORAL | Status: DC | PRN

## 2014-07-01 NOTE — Progress Notes (Signed)
Subjective:    Patient ID: Aimee HealElizabeth W Thompson, female    DOB: 01/11/1971, 44 y.o.   MRN: [id number]  Chief Complaint  Patient presents with  . Hand Pain    thinks she broke her fingers on right hand, recycling bin fell on her, happend this morning, also would like refill of BP medication    HPI:  Aimee Heallizabeth W Thompson is a 44 y.o. female who presents today for an acute visit.   1) Right hand pain - This is a new problem. Associated symptom of pain in her right hand located primarily in her 3-5th fingers which started this morning when she tripped on a recycling bin. Pain is described as numbness, tingling and burning with an intensity of 6/10. Modifying factors include ice and advil which have helped minimally.  Denies pain elsewhere.    Allergies  Allergen Reactions  . Penicillins Hives    Current Outpatient Prescriptions on File Prior to Visit  Medication Sig Dispense Refill  . albuterol (PROVENTIL HFA;VENTOLIN HFA) 108 (90 BASE) MCG/ACT inhaler Inhale 2 puffs into the lungs every 6 (six) hours as needed for wheezing or shortness of breath.    . ALPRAZolam (XANAX) 0.25 MG tablet Take 1 tablet (0.25 mg total) by mouth 2 (two) times daily as needed for anxiety. 60 tablet 2  . amphetamine-dextroamphetamine (ADDERALL XR) 10 MG 24 hr capsule 1 by mouth every AM and at 2 PM 90 capsule 0  . amphetamine-dextroamphetamine (ADDERALL XR) 25 MG 24 hr capsule Take 1 capsule by mouth every morning. 30 capsule 0  . budesonide-formoterol (SYMBICORT) 160-4.5 MCG/ACT inhaler Inhale 2 puffs into the lungs 2 (two) times daily. For Shortness of breath 1 Inhaler 6  . citalopram (CELEXA) 20 MG tablet Take 1.5 tablets (30 mg total) by mouth every morning. For depression 30 tablet 11  . metoprolol succinate (TOPROL-XL) 50 MG 24 hr tablet Take 1 tablet (50 mg total) by mouth every morning. Take with or immediately following a meal: For hypertension. 90 tablet 3  . traZODone (DESYREL) 50 MG tablet Take 1  tablet (50 mg total) by mouth at bedtime as needed for sleep. 30 tablet 0   No current facility-administered medications on file prior to visit.    Past Medical History  Diagnosis Date  . ASTHMA   . HEART MURMUR, HX OF   . ADD (attention deficit disorder)   . Hypertension      Review of Systems  Musculoskeletal:       Positive for hand pain.   Neurological: Positive for numbness.      Objective:    BP 142/90 mmHg  Pulse 72  Temp(Src) 98.1 F (36.7 C) (Oral)  Resp 18  Ht 5' 2.5" (1.588 m)  Wt 124 lb 6.4 oz (56.427 kg)  BMI 22.38 kg/m2  SpO2 96% Nursing note and vital signs reviewed.  Physical Exam  Constitutional: She is oriented to person, place, and time. She appears well-developed and well-nourished. No distress.  Cardiovascular: Normal rate, regular rhythm, normal heart sounds and intact distal pulses.   Pulmonary/Chest: Effort normal and breath sounds normal.  Musculoskeletal:  Right hand: No obvious deformity noted. There is obvious edema throughout the lateral aspect of her hand and mild discoloration on the palmer aspect of her 4th finger. Tenderness elicited along the 3rd, 4th and fifth metacarpals. Unable to complete make a fist with the 4th and fifth fingers. Pulses and capillary refill are intact and appropriate. Ring was removed from left 4th finger.  Neurological: She is alert and oriented to person, place, and time.  Skin: Skin is warm and dry.  Psychiatric: She has a normal mood and affect. Her behavior is normal. Judgment and thought content normal.       Assessment & Plan:

## 2014-07-01 NOTE — Progress Notes (Signed)
Pre visit review using our clinic review tool, if applicable. No additional management support is needed unless otherwise documented below in the visit note. 

## 2014-07-01 NOTE — Patient Instructions (Signed)
Thank you for choosing [facility].  Summary/Instructions:  Your prescription(s) have been submitted to your pharmacy or been printed and provided for you. Please take as directed and contact our office if you believe you are having problem(s) with the medication(s) or have any questions.  If your symptoms worsen or fail to improve, please contact our office for further instruction, or in case of emergency go directly to the emergency room at the closest medical facility.   Hand Fracture, Metacarpals Fractures of metacarpals are breaks in the bones of the hand. They extend from the knuckles to the wrist. These bones can undergo many types of fractures. There are different ways of treating these fractures, all of which may be correct. TREATMENT  Hand fractures can be treated with:   Non-reduction - The fracture is casted without changing the positions of the fracture (bone pieces) involved. This fracture is usually left in a cast for 4 to 6 weeks or as your caregiver thinks necessary.  Closed reduction - The bones are moved back into position without surgery and then casted.  ORIF (open reduction and internal fixation) - The fracture site is opened and the bone pieces are fixed into place with some type of hardware, such as screws, etc. They are then casted. Your caregiver will discuss the type of fracture you have and the treatment that should be best for that problem. If surgery is chosen, let your caregivers know about the following.  LET YOUR CAREGIVERS KNOW ABOUT:  Allergies.  Medications you are taking, including herbs, eye drops, over the counter medications, and creams.  Use of steroids (by mouth or creams).  Previous problems with anesthetics or novocaine.  Possibility of pregnancy.  History of blood clots (thrombophlebitis).  History of bleeding or blood problems.  Previous surgeries.  Other health problems. AFTER THE PROCEDURE After surgery, you will be taken  to the recovery area where a nurse will watch and check your progress. Once you are awake, stable, and taking fluids well, barring other problems, you'll be allowed to go home. Once home, an ice pack applied to your operative site may help with pain and keep the swelling down. HOME CARE INSTRUCTIONS   Follow your caregiver's instructions as to activities, exercises, physical therapy, and driving a car.  Daily exercise is helpful for keeping range of motion and strength. Exercise as instructed.  To lessen swelling, keep the injured hand elevated above the level of your heart as much as possible.  Apply ice to the injury for 15-20 minutes each hour while awake for the first 2 days. Put the ice in a plastic bag and place a thin towel between the bag of ice and your cast.  Move the fingers of your casted hand several times a day.  If a plaster or fiberglass cast was applied:  Do not try to scratch the skin under the cast using a sharp or pointed object.  Check the skin around the cast every day. You may put lotion on red or sore areas.  Keep your cast dry. Your cast can be protected during bathing with a plastic bag. Do not put your cast into the water.  If a plaster splint was applied:  Wear your splint for as long as directed by your caregiver or until seen again.  Do not get your splint wet. Protect it during bathing with a plastic bag.  You may loosen the elastic bandage around the splint if your fingers start to get numb, tingle, get  cold or turn blue.  Do not put pressure on your cast or splint; this may cause it to break. Especially, do not lean plaster casts on hard surfaces for 24 hours after application.  Take medications as directed by your caregiver.  Only take over-the-counter or prescription medicines for pain, discomfort, or fever as directed by your caregiver.  Follow-up as provided by your caregiver. This is very important in order to avoid permanent injury or  disability and chronic pain. SEEK MEDICAL CARE IF:   Increased bleeding (more than a small spot) from beneath your cast or splint if there is beneath the cast as with an open reduction.  Redness, swelling, or increasing pain in the wound or from beneath your cast or splint.  Pus coming from wound or from beneath your cast or splint.  An unexplained oral temperature above 102 F (38.9 C) develops, or as your caregiver suggests.  A foul smell coming from the wound or dressing or from beneath your cast or splint.  You have a problem moving any of your fingers. SEEK IMMEDIATE MEDICAL CARE IF:   You develop a rash  You have difficulty breathing  You have any allergy problems If you do not have a window in your cast for observing the wound, a discharge or minor bleeding may show up as a stain on the outside of your cast. Report these findings to your caregiver. MAKE SURE YOU:   Understand these instructions.  Will watch your condition.  Will get help right away if you are not doing well or get worse. Document Released: 03/07/2005 Document Revised: 05/30/2011 Document Reviewed: 10/25/2007 [facility] Patient Information 2015 [place], [place]. This information is not intended to replace advice given to you by your health care provider. Make sure you discuss any questions you have with your health care provider.

## 2014-07-01 NOTE — Assessment & Plan Note (Signed)
Symptoms and exam consistent with potential fracture of the right hand. Obtain x-rays of right hand. Wrist splint provided to patient for support. Start tramadol as needed for pain. Continue ibuprofen for inflammation. Continue to rest, ice, compress and elevate the right hand. Follow-up pending x-rays.

## 2014-10-17 ENCOUNTER — Telehealth: Payer: Self-pay | Admitting: Internal Medicine

## 2014-10-17 MED ORDER — METOPROLOL SUCCINATE ER 50 MG PO TB24: 50.0000 mg | ORAL_TABLET | Freq: Every morning | ORAL | Status: DC

## 2014-10-17 NOTE — Telephone Encounter (Signed)
/  Done erx - toprol  Due for ROV for further refills

## 2014-10-17 NOTE — Telephone Encounter (Signed)
Pt called in and needs a refill on her metoprolol succinate (TOPROL-XL) 50 MG 24 hr tablet [[id number]] .    Pharmacy - [facility] [address] [place] number [phone]

## 2014-12-26 ENCOUNTER — Emergency Department (HOSPITAL_COMMUNITY)
Admission: EM | Admit: 2014-12-26 | Discharge: 2014-12-26 | Disposition: A | Discharge status: Home / Self Care | Payer: Self-pay | Attending: Emergency Medicine | Admitting: Emergency Medicine

## 2014-12-26 ENCOUNTER — Encounter (HOSPITAL_COMMUNITY): Payer: Self-pay | Admitting: Emergency Medicine

## 2014-12-26 ENCOUNTER — Emergency Department (HOSPITAL_COMMUNITY): Payer: Self-pay

## 2014-12-26 ENCOUNTER — Emergency Department (HOSPITAL_COMMUNITY): Payer: BLUE CROSS/BLUE SHIELD

## 2014-12-26 DIAGNOSIS — E876 Hypokalemia: Secondary | ICD-10-CM | POA: Insufficient documentation

## 2014-12-26 DIAGNOSIS — Z79899 Other long term (current) drug therapy: Secondary | ICD-10-CM | POA: Insufficient documentation

## 2014-12-26 DIAGNOSIS — I10 Essential (primary) hypertension: Secondary | ICD-10-CM | POA: Insufficient documentation

## 2014-12-26 DIAGNOSIS — J45901 Unspecified asthma with (acute) exacerbation: Secondary | ICD-10-CM | POA: Insufficient documentation

## 2014-12-26 DIAGNOSIS — Z880 Allergy status to penicillin: Secondary | ICD-10-CM | POA: Insufficient documentation

## 2014-12-26 DIAGNOSIS — I4589 Other specified conduction disorders: Secondary | ICD-10-CM | POA: Insufficient documentation

## 2014-12-26 DIAGNOSIS — R079 Chest pain, unspecified: Secondary | ICD-10-CM | POA: Insufficient documentation

## 2014-12-26 DIAGNOSIS — R011 Cardiac murmur, unspecified: Secondary | ICD-10-CM | POA: Insufficient documentation

## 2014-12-26 DIAGNOSIS — F909 Attention-deficit hyperactivity disorder, unspecified type: Secondary | ICD-10-CM | POA: Insufficient documentation

## 2014-12-26 DIAGNOSIS — R1013 Epigastric pain: Secondary | ICD-10-CM | POA: Insufficient documentation

## 2014-12-26 DIAGNOSIS — Z7951 Long term (current) use of inhaled steroids: Secondary | ICD-10-CM | POA: Insufficient documentation

## 2014-12-26 DIAGNOSIS — R9431 Abnormal electrocardiogram [ECG] [EKG]: Secondary | ICD-10-CM

## 2014-12-26 LAB — COMPREHENSIVE METABOLIC PANEL
ALT: 58 U/L — AB (ref 14–54)
AST: 92 U/L — AB (ref 15–41)
Albumin: 4.2 g/dL (ref 3.5–5.0)
Alkaline Phosphatase: 71 U/L (ref 38–126)
Anion gap: 13 (ref 5–15)
CHLORIDE: 102 mmol/L (ref 101–111)
CO2: 21 mmol/L — ABNORMAL LOW (ref 22–32)
CREATININE: 0.88 mg/dL (ref 0.44–1.00)
Calcium: 9.1 mg/dL (ref 8.9–10.3)
GFR calc Af Amer: >60 mL/min (ref >60)
GFR calc non Af Amer: >60 mL/min (ref >60)
GLUCOSE: 140 mg/dL — AB (ref 65–99)
Potassium: 2.8 mmol/L — ABNORMAL LOW (ref 3.5–5.1)
SODIUM: 136 mmol/L (ref 135–145)
Total Bilirubin: 0.9 mg/dL (ref 0.3–1.2)
Total Protein: 7.7 g/dL (ref 6.5–8.1)

## 2014-12-26 LAB — CBC WITH DIFFERENTIAL/PLATELET
Basophils Absolute: 0 10*3/uL (ref 0.0–0.1)
Basophils Relative: 0 %
EOS ABS: 0.1 10*3/uL (ref 0.0–0.7)
Eosinophils Relative: 1 %
HCT: 41 % (ref 36.0–46.0)
Hemoglobin: 14.4 g/dL (ref 12.0–15.0)
LYMPHS PCT: 10 %
Lymphs Abs: 0.6 10*3/uL — ABNORMAL LOW (ref 0.7–4.0)
MCH: 33.8 pg (ref 26.0–34.0)
MCHC: 35.1 g/dL (ref 30.0–36.0)
MCV: 96.2 fL (ref 78.0–100.0)
Monocytes Absolute: 0.3 10*3/uL (ref 0.1–1.0)
Monocytes Relative: 5 %
NEUTROS PCT: 84 %
Neutro Abs: 5.5 10*3/uL (ref 1.7–7.7)
Platelets: 275 10*3/uL (ref 150–400)
RBC: 4.26 MIL/uL (ref 3.87–5.11)
RDW: 11.9 % (ref 11.5–15.5)
WBC: 6.5 10*3/uL (ref 4.0–10.5)

## 2014-12-26 LAB — I-STAT BETA HCG BLOOD, ED (MC, WL, AP ONLY): I-stat hCG, quantitative: <5 m[IU]/mL (ref <5)

## 2014-12-26 LAB — I-STAT TROPONIN, ED
TROPONIN I, POC: 0 ng/mL (ref 0.00–0.08)
Troponin i, poc: 0 ng/mL (ref 0.00–0.08)

## 2014-12-26 LAB — LIPASE, BLOOD: Lipase: 21 U/L — ABNORMAL LOW (ref 22–51)

## 2014-12-26 MED ORDER — ONDANSETRON HCL 4 MG/2ML IJ SOLN
4.0000 mg | Freq: Once | INTRAMUSCULAR | Status: AC
  Administered 2014-12-26: 4 mg via INTRAVENOUS
  Filled 2014-12-26: qty 2

## 2014-12-26 MED ORDER — POTASSIUM CHLORIDE CRYS ER 20 MEQ PO TBCR
20.0000 meq | EXTENDED_RELEASE_TABLET | Freq: Once | ORAL | Status: AC
  Administered 2014-12-26: 20 meq via ORAL
  Filled 2014-12-26: qty 1

## 2014-12-26 MED ORDER — POTASSIUM CHLORIDE CRYS ER 20 MEQ PO TBCR
20.0000 meq | EXTENDED_RELEASE_TABLET | Freq: Two times a day (BID) | ORAL | Status: DC
  Administered 2014-12-26: 20 meq via ORAL
  Filled 2014-12-26: qty 1

## 2014-12-26 MED ORDER — FAMOTIDINE 20 MG PO TABS
20.0000 mg | ORAL_TABLET | Freq: Once | ORAL | Status: AC
  Administered 2014-12-26: 20 mg via ORAL
  Filled 2014-12-26: qty 1

## 2014-12-26 MED ORDER — ASPIRIN 81 MG PO CHEW
324.0000 mg | CHEWABLE_TABLET | ORAL | Status: AC
  Administered 2014-12-26: 324 mg via ORAL

## 2014-12-26 MED ORDER — LORAZEPAM 2 MG/ML IJ SOLN
0.5000 mg | Freq: Once | INTRAMUSCULAR | Status: AC
  Administered 2014-12-26: 0.5 mg via INTRAVENOUS
  Filled 2014-12-26: qty 1

## 2014-12-26 MED ORDER — ASPIRIN 81 MG PO CHEW
CHEWABLE_TABLET | ORAL | Status: AC
  Filled 2014-12-26: qty 4

## 2014-12-26 MED ORDER — POTASSIUM CHLORIDE CRYS ER 20 MEQ PO TBCR: 20.0000 meq | EXTENDED_RELEASE_TABLET | Freq: Two times a day (BID) | ORAL | Status: DC

## 2014-12-26 MED ORDER — ASPIRIN EC 325 MG PO TBEC
325.0000 mg | DELAYED_RELEASE_TABLET | Freq: Once | ORAL | Status: DC
  Filled 2014-12-26: qty 1

## 2014-12-26 NOTE — ED Notes (Signed)
[place] ultrasound at bedside.

## 2014-12-26 NOTE — ED Notes (Signed)
MD at bedside. 

## 2014-12-26 NOTE — ED Notes (Signed)
Pt declined w/c upon d/c. Ambulatory with s/o, steady gait.

## 2014-12-26 NOTE — ED Notes (Signed)
EKG given to Dr Nguyen

## 2014-12-26 NOTE — ED Notes (Signed)
Pt reports central CP with radiation to shoulder blades that started last night associated with weakness and sob. Has had nausea since yesterday.

## 2014-12-26 NOTE — ED Notes (Signed)
Bed: WA19 Expected date:  Expected time:  Means of arrival:  Comments: 

## 2014-12-26 NOTE — ED Provider Notes (Signed)
CSN: [id number]     Arrival date & time 12/26/14  0711 History   First MD Initiated Contact with Patient 12/26/14 [phone]     Chief Complaint  Patient presents with  . Chest Pain     (Consider location/radiation/quality/duration/timing/severity/associated sxs/prior Treatment) HPI Comments: 44 y.o. Female with history of HTN presents for chest pain.  The patient states that she has had pressure like pain in her chest since last night.  The pain has been constant since on set.  She has never had pain like this before.  Nothing seems to make it better or worse.  She has been nauseous since the chest pain started and reports a few episodes of vomiting.  The pain is not sharp, it is not made worse with deep breaths.  Denies leg swelling, recent travel/surgery/immobility.  No fevers, chills, cough. Reports that her mother was diagnosed with high blood pressure around this age.  Denies abdominal pain.   Past Medical History  Diagnosis Date  . ASTHMA   . HEART MURMUR, HX OF   . ADD (attention deficit disorder)   . Hypertension    Past Surgical History  Procedure Laterality Date  . Laproscopy     Family History  Problem Relation Age of Onset  . Hypertension Other    Social History  Substance Use Topics  . Smoking status: Never Smoker   . Smokeless tobacco: None  . Alcohol Use: Yes     Comment: social   OB History    No data available     Review of Systems  Constitutional: Negative for fever, chills, diaphoresis, appetite change and fatigue.  HENT: Negative for congestion, postnasal drip and rhinorrhea.   Eyes: Negative for pain and redness.  Respiratory: Positive for shortness of breath. Negative for cough, chest tightness and wheezing.   Cardiovascular: Positive for chest pain. Negative for palpitations and leg swelling.  Gastrointestinal: Positive for nausea and vomiting. Negative for abdominal pain, diarrhea and constipation.  Genitourinary: Negative for dysuria, urgency,  frequency and hematuria.  Musculoskeletal: Negative for myalgias, back pain and neck pain.  Skin: Negative for rash.  Neurological: Negative for dizziness, weakness, light-headedness and headaches.  Hematological: Does not bruise/bleed easily.      Allergies  Penicillins and Scallops  Home Medications   Prior to Admission medications   Medication Sig Start Date End Date Taking? Authorizing Provider  albuterol (PROVENTIL HFA;VENTOLIN HFA) 108 (90 BASE) MCG/ACT inhaler Inhale 2 puffs into the lungs every 6 (six) hours as needed for wheezing or shortness of breath. 02/11/13  Yes Sanjuana Kava, NP  budesonide-formoterol (SYMBICORT) 160-4.5 MCG/ACT inhaler Inhale 2 puffs into the lungs 2 (two) times daily. For Shortness of breath Patient taking differently: Inhale 2 puffs into the lungs 2 (two) times daily as needed (For shortness of breath.).  06/04/13  Yes Corwin Levins, MD  ibuprofen (ADVIL,MOTRIN) 200 MG tablet Take 400 mg by mouth every 6 (six) hours as needed (For cramping.).   Yes Historical Provider, MD  metoprolol succinate (TOPROL-XL) 50 MG 24 hr tablet Take 1 tablet (50 mg total) by mouth every morning. Take with or immediately following a meal: For hypertension. 10/17/14  Yes Corwin Levins, MD  ALPRAZolam Prudy Feeler) 0.25 MG tablet Take 1 tablet (0.25 mg total) by mouth 2 (two) times daily as needed for anxiety. Patient not taking: Reported on 12/26/2014 09/19/13   Corwin Levins, MD  amphetamine-dextroamphetamine (ADDERALL XR) 10 MG 24 hr capsule 1 by mouth every AM and  at 2 PM Patient not taking: Reported on 12/26/2014 04/03/14   Corwin Levins, MD  amphetamine-dextroamphetamine (ADDERALL XR) 25 MG 24 hr capsule Take 1 capsule by mouth every morning. Patient not taking: Reported on 12/26/2014 04/03/14   Corwin Levins, MD  citalopram (CELEXA) 20 MG tablet Take 1.5 tablets (30 mg total) by mouth every morning. For depression Patient not taking: Reported on 12/26/2014 06/14/13   Corwin Levins, MD   potassium chloride SA (K-DUR,KLOR-CON) 20 MEQ tablet Take 1 tablet (20 mEq total) by mouth 2 (two) times daily. 12/26/14 01/02/15  Leta Baptist, MD  traMADol (ULTRAM) 50 MG tablet Take 1 tablet (50 mg total) by mouth every 8 (eight) hours as needed. Patient not taking: Reported on 12/26/2014 07/01/14   Veryl Speak, FNP  traZODone (DESYREL) 50 MG tablet Take 1 tablet (50 mg total) by mouth at bedtime as needed for sleep. Patient not taking: Reported on 12/26/2014 02/11/13   Sanjuana Kava, NP   BP 134/80 mmHg  Pulse 71  Temp(Src) 98.5 F (36.9 C) (Oral)  Resp 19  SpO2 99% Physical Exam  Constitutional: She is oriented to person, place, and time. She appears well-developed and well-nourished. No distress.  HENT:  Head: Normocephalic and atraumatic.  Right Ear: External ear normal.  Left Ear: External ear normal.  Nose: Nose normal.  Mouth/Throat: Oropharynx is clear and moist. No oropharyngeal exudate.  Eyes: EOM are normal. Pupils are equal, round, and reactive to light.  Neck: Normal range of motion. Neck supple.  Cardiovascular: Normal rate, regular rhythm, normal heart sounds and intact distal pulses.   No murmur heard. Pulmonary/Chest: Effort normal. No respiratory distress. She has no wheezes. She has no rales.  Abdominal: Soft. She exhibits no distension. There is tenderness (mild) in the epigastric area. There is no CVA tenderness, no tenderness at McBurney's point and negative Murphy's sign.  Musculoskeletal: Normal range of motion. She exhibits no edema or tenderness.  Neurological: She is alert and oriented to person, place, and time.  Skin: Skin is warm and dry. No rash noted. She is not diaphoretic.  Vitals reviewed.   ED Course  Procedures (including critical care time) Labs Review Labs Reviewed  CBC WITH DIFFERENTIAL/PLATELET - Abnormal; Notable for the following:    Lymphs Abs 0.6 (*)    All other components within normal limits  COMPREHENSIVE METABOLIC  PANEL - Abnormal; Notable for the following:    Potassium 2.8 (*)    CO2 21 (*)    Glucose, Bld 140 (*)    BUN <5 (*)    AST 92 (*)    ALT 58 (*)    All other components within normal limits  LIPASE, BLOOD - Abnormal; Notable for the following:    Lipase 21 (*)    All other components within normal limits  I-STAT TROPOININ, ED  I-STAT BETA HCG BLOOD, ED ([facility], [facility], [facility] ONLY)  I-STAT TROPOININ, ED    Imaging Review Dg Chest 2 View  12/26/2014   CLINICAL DATA:  New onset of mid chest pain radiating to the shoulders, onset last night, associated nausea and shortness of breath, history of asthma, nonsmoker.  EXAM: CHEST  2 VIEW  COMPARISON:  PA and lateral chest x-ray of October 31, 2012  FINDINGS: The lungs are well-expanded and clear. The heart and pulmonary vascularity are normal. The mediastinum is normal in width. There is no pleural effusion. The bony thorax exhibits no acute abnormality.  IMPRESSION: There is no  active cardiopulmonary disease.   Electronically Signed   By: David  [place] M.D.   On: 12/26/2014 08:31   US Abdomen Limited  12/26/2014   CLINICAL DATA:  Right upper quadrant pain.  EXAM: US ABDOMEN LIMITED - RIGHT UPPER QUADRANT  COMPARISON:  CT 07/06/2005.  FINDINGS: Gallbladder:  No gallstones or wall thickening visualized. No sonographic Murphy sign noted.  Common bile duct:  Diameter: 5.4 mm  Liver:  No focal lesion identified. Within normal limits in parenchymal echogenicity.  IMPRESSION: Negative exam.   Electronically Signed   By: Maisie Fus  Register   On: 12/26/2014 09:32   I have personally reviewed and evaluated these images and lab results as part of my medical decision-making.   EKG Interpretation   Date/Time:  Friday December 26 2014 07:23:29 EDT Ventricular Rate:  91 PR Interval:  145 QRS Duration: 97 QT Interval:  420 QTC Calculation: 517 R Axis:   61 Text Interpretation:  Sinus rhythm Probable left atrial enlargement  Borderline repolarization abnormality  Prolonged QT interval Baseline  wander in lead(s) V5 No previous ECGs available Confirmed by Ric Rosenberg  ([id number]) on 12/26/2014 7:39:55 AM      MDM  Patient was seen and evaluated in stable condition.  Chest xray, troponin x2 normal.  EKG with mild QT prolongation which improved after initial dose of potassium.  US abdomen unremarkable.  Additional potassium given.  Discussed case, hypokalemia, prolonged QT on the phone with Dr. Excell Seltzer from cardiology who at this time said the patient did not need admission for monitoring but he would recommend strict return precautions and follow up early next week with PCP for repeat labs and EKG as well as prescription for 20 mg potassium twice daily until that time.  Discussed all results, clinical impression, and cardiology recommendations with patient and her significant other who both expressed understanding and agreement.  Patient was discharged home in stable condition with prescription for potassium, instruction to follow up with her PCP for repeat EKG and CMP, and strict return precautions.  All questions answered prior to discharge. Final diagnoses:  Chest pain, unspecified chest pain type  Prolonged QT interval  Hypokalemia    1. Chest pain  2. Hypokalemia  3. Prolonged QT    Leta Baptist, MD 12/26/14 2244

## 2014-12-26 NOTE — Discharge Instructions (Signed)
You were found to have a mildly prolonged QT interval and low potassium.  We have given you potassium to help with this issue.  I discussed with the cardiologist who wants you to take potassium at home and to follow up with your primary care physician next week to have your potassium rechecked and an EKG rechecked.  Return immediately if you develop racing heart rate, feeling like your heart is beating abnormally or any other new concerning symptoms.  Nonspecific Chest Pain  Chest pain can be caused by many different conditions. There is always a chance that your pain could be related to something serious, such as a heart attack or a blood clot in your lungs. Chest pain can also be caused by conditions that are not life-threatening. If you have chest pain, it is very important to follow up with your health care provider. CAUSES  Chest pain can be caused by:  Heartburn.  Pneumonia or bronchitis.  Anxiety or stress.  Inflammation around your heart (pericarditis) or lung (pleuritis or pleurisy).  A blood clot in your lung.  A collapsed lung (pneumothorax). It can develop suddenly on its own (spontaneous pneumothorax) or from trauma to the chest.  Shingles infection (varicella-zoster virus).  Heart attack.  Damage to the bones, muscles, and cartilage that make up your chest wall. This can include:  Bruised bones due to injury.  Strained muscles or cartilage due to frequent or repeated coughing or overwork.  Fracture to one or more ribs.  Sore cartilage due to inflammation (costochondritis). RISK FACTORS  Risk factors for chest pain may include:  Activities that increase your risk for trauma or injury to your chest.  Respiratory infections or conditions that cause frequent coughing.  Medical conditions or overeating that can cause heartburn.  Heart disease or family history of heart disease.  Conditions or health behaviors that increase your risk of developing a blood  clot.  Having had chicken pox (varicella zoster). SIGNS AND SYMPTOMS Chest pain can feel like:  Burning or tingling on the surface of your chest or deep in your chest.  Crushing, pressure, aching, or squeezing pain.  Dull or sharp pain that is worse when you move, cough, or take a deep breath.  Pain that is also felt in your back, neck, shoulder, or arm, or pain that spreads to any of these areas. Your chest pain may come and go, or it may stay constant. DIAGNOSIS Lab tests or other studies may be needed to find the cause of your pain. Your health care provider may have you take a test called an ambulatory ECG (electrocardiogram). An ECG records your heartbeat patterns at the time the test is performed. You may also have other tests, such as:  Transthoracic echocardiogram (TTE). During echocardiography, sound waves are used to create a picture of all of the heart structures and to look at how blood flows through your heart.  Transesophageal echocardiogram (TEE).This is a more advanced imaging test that obtains images from inside your body. It allows your health care provider to see your heart in finer detail.  Cardiac monitoring. This allows your health care provider to monitor your heart rate and rhythm in real time.  Holter monitor. This is a portable device that records your heartbeat and can help to diagnose abnormal heartbeats. It allows your health care provider to track your heart activity for several days, if needed.  Stress tests. These can be done through exercise or by taking medicine that makes your heart beat  more quickly.  Blood tests.  Imaging tests. TREATMENT  Your treatment depends on what is causing your chest pain. Treatment may include:  Medicines. These may include:  Acid blockers for heartburn.  Anti-inflammatory medicine.  Pain medicine for inflammatory conditions.  Antibiotic medicine, if an infection is present.  Medicines to dissolve blood  clots.  Medicines to treat coronary artery disease.  Supportive care for conditions that do not require medicines. This may include:  Resting.  Applying heat or cold packs to injured areas.  Limiting activities until pain decreases. HOME CARE INSTRUCTIONS  If you were prescribed an antibiotic medicine, finish it all even if you start to feel better.  Avoid any activities that bring on chest pain.  Do not use any tobacco products, including cigarettes, chewing tobacco, or electronic cigarettes. If you need help quitting, ask your health care provider.  Do not drink alcohol.  Take medicines only as directed by your health care provider.  Keep all follow-up visits as directed by your health care provider. This is important. This includes any further testing if your chest pain does not go away.  If heartburn is the cause for your chest pain, you may be told to keep your head raised (elevated) while sleeping. This reduces the chance that acid will go from your stomach into your esophagus.  Make lifestyle changes as directed by your health care provider. These may include:  Getting regular exercise. Ask your health care provider to suggest some activities that are safe for you.  Eating a heart-healthy diet. A registered dietitian can help you to learn healthy eating options.  Maintaining a healthy weight.  Managing diabetes, if necessary.  Reducing stress. SEEK MEDICAL CARE IF:  Your chest pain does not go away after treatment.  You have a rash with blisters on your chest.  You have a fever. SEEK IMMEDIATE MEDICAL CARE IF:   Your chest pain is worse.  You have an increasing cough, or you cough up blood.  You have severe abdominal pain.  You have severe weakness.  You faint.  You have chills.  You have sudden, unexplained chest discomfort.  You have sudden, unexplained discomfort in your arms, back, neck, or jaw.  You have shortness of breath at any  time.  You suddenly start to sweat, or your skin gets clammy.  You feel nauseous or you vomit.  You suddenly feel light-headed or dizzy.  Your heart begins to beat quickly, or it feels like it is skipping beats. These symptoms may represent a serious problem that is an emergency. Do not wait to see if the symptoms will go away. Get medical help right away. Call your local emergency services (911 in the [place] Do not drive yourself to the hospital.   This information is not intended to replace advice given to you by your health care provider. Make sure you discuss any questions you have with your health care provider.   Document Released: 12/15/2004 Document Revised: 03/28/2014 Document Reviewed: 10/11/2013 [employer] Interactive Patient Education [employer]

## 2015-01-16 ENCOUNTER — Other Ambulatory Visit: Payer: Self-pay | Admitting: Internal Medicine

## 2015-03-26 ENCOUNTER — Ambulatory Visit (INDEPENDENT_AMBULATORY_CARE_PROVIDER_SITE_OTHER): Payer: BLUE CROSS/BLUE SHIELD | Admitting: Internal Medicine

## 2015-03-26 ENCOUNTER — Encounter: Payer: Self-pay | Admitting: Internal Medicine

## 2015-03-26 ENCOUNTER — Telehealth: Payer: Self-pay | Admitting: Internal Medicine

## 2015-03-26 ENCOUNTER — Other Ambulatory Visit (INDEPENDENT_AMBULATORY_CARE_PROVIDER_SITE_OTHER): Payer: BLUE CROSS/BLUE SHIELD

## 2015-03-26 VITALS — BP 122/88 | HR 74 | Temp 99.3°F | Ht 63.0 in | Wt 135.0 lb

## 2015-03-26 DIAGNOSIS — K921 Melena: Secondary | ICD-10-CM | POA: Diagnosis not present

## 2015-03-26 DIAGNOSIS — I10 Essential (primary) hypertension: Secondary | ICD-10-CM | POA: Diagnosis not present

## 2015-03-26 DIAGNOSIS — R1033 Periumbilical pain: Secondary | ICD-10-CM

## 2015-03-26 DIAGNOSIS — R109 Unspecified abdominal pain: Secondary | ICD-10-CM | POA: Insufficient documentation

## 2015-03-26 LAB — URINALYSIS, ROUTINE W REFLEX MICROSCOPIC
Bilirubin Urine: NEGATIVE
HGB URINE DIPSTICK: NEGATIVE
Ketones, ur: NEGATIVE
Leukocytes, UA: NEGATIVE
Nitrite: NEGATIVE
RBC / HPF: NONE SEEN (ref 0–?)
Total Protein, Urine: NEGATIVE
Urine Glucose: NEGATIVE
Urobilinogen, UA: 0.2 (ref 0.0–1.0)
pH: 7 (ref 5.0–8.0)

## 2015-03-26 LAB — CBC WITH DIFFERENTIAL/PLATELET
BASOS PCT: 0.6 % (ref 0.0–3.0)
Basophils Absolute: 0 10*3/uL (ref 0.0–0.1)
EOS ABS: 0.1 10*3/uL (ref 0.0–0.7)
Eosinophils Relative: 2.1 % (ref 0.0–5.0)
HEMATOCRIT: 42.9 % (ref 36.0–46.0)
Hemoglobin: 14.6 g/dL (ref 12.0–15.0)
LYMPHS ABS: 1.6 10*3/uL (ref 0.7–4.0)
LYMPHS PCT: 28.2 % (ref 12.0–46.0)
MCHC: 34 g/dL (ref 30.0–36.0)
MCV: 96.7 fl (ref 78.0–100.0)
MONOS PCT: 7.5 % (ref 3.0–12.0)
Monocytes Absolute: 0.4 10*3/uL (ref 0.1–1.0)
NEUTROS ABS: 3.6 10*3/uL (ref 1.4–7.7)
NEUTROS PCT: 61.6 % (ref 43.0–77.0)
PLATELETS: 273 10*3/uL (ref 150.0–400.0)
RBC: 4.43 Mil/uL (ref 3.87–5.11)
RDW: 12.3 % (ref 11.5–15.5)
WBC: 5.8 10*3/uL (ref 4.0–10.5)

## 2015-03-26 LAB — BASIC METABOLIC PANEL
BUN: 14 mg/dL (ref 6–23)
CHLORIDE: 101 meq/L (ref 96–112)
CO2: 29 meq/L (ref 19–32)
CREATININE: 0.73 mg/dL (ref 0.40–1.20)
Calcium: 9.2 mg/dL (ref 8.4–10.5)
GFR: 91.7 mL/min (ref >60.00)
Glucose, Bld: 107 mg/dL — ABNORMAL HIGH (ref 70–99)
Potassium: 4.1 mEq/L (ref 3.5–5.1)
SODIUM: 137 meq/L (ref 135–145)

## 2015-03-26 LAB — HEPATIC FUNCTION PANEL
ALK PHOS: 60 U/L (ref 39–117)
ALT: 32 U/L (ref 0–35)
AST: 34 U/L (ref 0–37)
Albumin: 4.3 g/dL (ref 3.5–5.2)
BILIRUBIN DIRECT: 0.1 mg/dL (ref 0.0–0.3)
BILIRUBIN TOTAL: 0.6 mg/dL (ref 0.2–1.2)
TOTAL PROTEIN: 7.3 g/dL (ref 6.0–8.3)

## 2015-03-26 LAB — LIPASE: LIPASE: 44 U/L (ref 11.0–59.0)

## 2015-03-26 LAB — TSH: TSH: 2 u[IU]/mL (ref 0.35–4.50)

## 2015-03-26 MED ORDER — PANTOPRAZOLE SODIUM 40 MG PO TBEC: 40.0000 mg | DELAYED_RELEASE_TABLET | Freq: Every day | ORAL | Status: DC

## 2015-03-26 MED ORDER — AZITHROMYCIN 250 MG PO TABS: ORAL_TABLET | ORAL | Status: DC

## 2015-03-26 MED ORDER — ALPRAZOLAM 0.25 MG PO TABS: 0.2500 mg | ORAL_TABLET | Freq: Two times a day (BID) | ORAL | Status: DC | PRN

## 2015-03-26 MED ORDER — ESCITALOPRAM OXALATE 10 MG PO TABS: 10.0000 mg | ORAL_TABLET | Freq: Every day | ORAL | Status: DC

## 2015-03-26 MED ORDER — AMPHETAMINE-DEXTROAMPHET ER 10 MG PO CP24: ORAL_CAPSULE | ORAL | Status: DC

## 2015-03-26 MED ORDER — ALBUTEROL SULFATE HFA 108 (90 BASE) MCG/ACT IN AERS: 2.0000 | INHALATION_SPRAY | Freq: Four times a day (QID) | RESPIRATORY_TRACT | Status: DC | PRN

## 2015-03-26 MED ORDER — POTASSIUM CHLORIDE CRYS ER 20 MEQ PO TBCR
20.0000 meq | EXTENDED_RELEASE_TABLET | Freq: Two times a day (BID) | ORAL | Status: DC
Start: 2015-03-26 — End: 2015-09-30

## 2015-03-26 NOTE — Progress Notes (Signed)
Subjective:    Patient ID: Aimee HealElizabeth W Thompson, female    DOB: 09/25/1970, 45 y.o.   MRN: [id number]  HPI  Here to f/u with c/o several months ongoing mild to mod intermittent but persistent abd pain diffuse but mostly mid abd, assoc with nausea, watery diarrhea, marked fatigue/exhaustion, achy all over, also with intermittent BRBPR.   Pt denies fever, wt loss, night sweats, loss of appetite, or other constitutional symptoms  Pt denies chest pain, increased sob or doe, wheezing, orthopnea, PND, increased LE swelling, palpitations, dizziness or syncope.  Pt denies new neurological symptoms such as new headache, or facial or extremity weakness or numbness   Pt denies polydipsia, polyuria  Denies worsening depressive symptoms, suicidal ideation, or panic Wt Readings from Last 3 Encounters:  03/26/15 135 lb (61.236 kg)  07/01/14 124 lb 6.4 oz (56.427 kg)  02/09/13 121 lb (54.885 kg)   Past Medical History  Diagnosis Date  . ASTHMA   . HEART MURMUR, HX OF   . ADD (attention deficit disorder)   . Hypertension    Past Surgical History  Procedure Laterality Date  . Laproscopy      reports that she has never smoked. She does not have any smokeless tobacco history on file. She reports that she drinks alcohol. She reports that she does not use illicit drugs. family history includes Hypertension in her other. Allergies  Allergen Reactions  . Penicillins Hives    Has patient had a PCN reaction causing immediate rash, facial/tongue/throat swelling, SOB or lightheadedness with hypotension: no Has patient had a PCN reaction causing severe rash involving mucus membranes or skin necrosis: no Has patient had a PCN reaction that required hospitalization no Has patient had a PCN reaction occurring within the last 10 years: yes If all of the above answers are "NO", then may proceed with Cephalosporin use.  Caryl Never. Scallops [Shellfish Allergy] Itching and Other (See Comments)    Causes redness   Current  Outpatient Prescriptions on File Prior to Visit  Medication Sig Dispense Refill  . budesonide-formoterol (SYMBICORT) 160-4.5 MCG/ACT inhaler Inhale 2 puffs into the lungs 2 (two) times daily. For Shortness of breath (Patient taking differently: Inhale 2 puffs into the lungs 2 (two) times daily as needed (For shortness of breath.). ) 1 Inhaler 6  . metoprolol succinate (TOPROL-XL) 50 MG 24 hr tablet TAKE 1 TAB BY MOUTH EVERY MORNING. TAKE WITH OR IMMEDIATELY AFTER A MEAL FOR HYPERTENSION 90 tablet 0  . traMADol (ULTRAM) 50 MG tablet Take 1 tablet (50 mg total) by mouth every 8 (eight) hours as needed. 30 tablet 0  . traZODone (DESYREL) 50 MG tablet Take 1 tablet (50 mg total) by mouth at bedtime as needed for sleep. 30 tablet 0  . amphetamine-dextroamphetamine (ADDERALL XR) 25 MG 24 hr capsule Take 1 capsule by mouth every morning. (Patient not taking: Reported on 03/26/2015) 30 capsule 0  . ibuprofen (ADVIL,MOTRIN) 200 MG tablet Take 400 mg by mouth every 6 (six) hours as needed (For cramping.). Reported on 03/26/2015     No current facility-administered medications on file prior to visit.   Review of Systems  Constitutional: Negative for unusual diaphoresis or night sweats HENT: Negative for ringing in ear or discharge Eyes: Negative for double vision or worsening visual disturbance.  Respiratory: Negative for choking and stridor.   Gastrointestinal: Negative for vomiting or other signifcant bowel change Genitourinary: Negative for hematuria or change in urine volume.  Musculoskeletal: Negative for other MSK pain or swelling  Skin: Negative for color change and worsening wound.  Neurological: Negative for tremors and numbness other than noted  Psychiatric/Behavioral: Negative for decreased concentration or agitation other than above       Objective:   Physical Exam BP 122/88 mmHg  Pulse 74  Temp(Src) 99.3 F (37.4 C) (Oral)  Ht 5\' 3"  (1.6 m)  Wt 135 lb (61.236 kg)  BMI 23.92 kg/m2  SpO2  97%  LMP 03/15/2015 VS noted, not ill appearing Constitutional: Pt appears in no significant distress HENT: Head: NCAT.  Right Ear: External ear normal.  Left Ear: External ear normal.  Eyes: . Pupils are equal, round, and reactive to light. Conjunctivae and EOM are normal Neck: Normal range of motion. Neck supple.  Cardiovascular: Normal rate and regular rhythm.   Pulmonary/Chest: Effort normal and breath sounds without rales or wheezing.  Abd:  Soft, ND, + BS, mild mid abd tender, no guarding or rebound Neurological: Pt is alert. Not confused , motor grossly intact Skin: Skin is warm. No rash, no LE edema Psychiatric: Pt behavior is normal. No agitation.     Assessment & Plan:

## 2015-03-26 NOTE — Telephone Encounter (Signed)
Pt is correct, lexapro done erx

## 2015-03-26 NOTE — Patient Instructions (Signed)
Please take all new medication as prescribed - the protonix 40 mg per day  Please continue all other medications as before, and refills have been done if requested.  Please have the pharmacy call with any other refills you may need.  You will be contacted regarding the referral for: Gastroentorology  Please keep your appointments with your specialists as you may have planned  Please go to the LAB in the Basement (turn left off the elevator) for the tests to be done today  You will be contacted by phone if any changes need to be made immediately.  Otherwise, you will receive a letter about your results with an explanation, but please check with MyChart first.  Please remember to sign up for MyChart if you have not done so, as this will be important to you in the future with finding out test results, communicating by private email, and scheduling acute appointments online when needed.

## 2015-03-26 NOTE — Progress Notes (Signed)
Pre visit review using our clinic review tool, if applicable. No additional management support is needed unless otherwise documented below in the visit note. 

## 2015-03-26 NOTE — Telephone Encounter (Signed)
Pt advised.

## 2015-03-26 NOTE — Telephone Encounter (Signed)
Pt just saw Dr. Jonny RuizJohn for an appt, Dr. Jonny RuizJohn was suppose to change Celexa to Lexapro for her. Please check and send it in to [facility].

## 2015-03-28 NOTE — Assessment & Plan Note (Signed)
Etiology unclear, for trial PPI, also labs as ordered, . to f/u any worsening symptoms or concerns

## 2015-03-28 NOTE — Assessment & Plan Note (Signed)
stable overall by history and exam, recent data reviewed with pt, and pt to continue medical treatment as before,  to f/u any worsening symptoms or concerns BP Readings from Last 3 Encounters:  03/26/15 122/88  12/26/14 134/80  07/01/14 142/90

## 2015-03-28 NOTE — Assessment & Plan Note (Signed)
Recent onset, with abd pain cant r/o IBD - for GI referral,  to f/u any worsening symptoms or concerns

## 2015-04-02 ENCOUNTER — Encounter: Payer: Self-pay | Admitting: Gastroenterology

## 2015-04-02 ENCOUNTER — Ambulatory Visit (INDEPENDENT_AMBULATORY_CARE_PROVIDER_SITE_OTHER): Payer: BLUE CROSS/BLUE SHIELD | Admitting: Gastroenterology

## 2015-04-02 VITALS — BP 140/82 | HR 64 | Ht 62.5 in | Wt 134.4 lb

## 2015-04-02 DIAGNOSIS — R1013 Epigastric pain: Secondary | ICD-10-CM | POA: Diagnosis not present

## 2015-04-02 DIAGNOSIS — R112 Nausea with vomiting, unspecified: Secondary | ICD-10-CM | POA: Diagnosis not present

## 2015-04-02 DIAGNOSIS — R197 Diarrhea, unspecified: Secondary | ICD-10-CM | POA: Diagnosis not present

## 2015-04-02 DIAGNOSIS — R1011 Right upper quadrant pain: Secondary | ICD-10-CM

## 2015-04-02 DIAGNOSIS — K921 Melena: Secondary | ICD-10-CM

## 2015-04-02 MED ORDER — ONDANSETRON HCL 4 MG PO TABS: 4.0000 mg | ORAL_TABLET | Freq: Three times a day (TID) | ORAL | Status: DC | PRN

## 2015-04-02 MED ORDER — PANTOPRAZOLE SODIUM 40 MG PO TBEC: 40.0000 mg | DELAYED_RELEASE_TABLET | Freq: Two times a day (BID) | ORAL | Status: DC

## 2015-04-02 MED ORDER — DICYCLOMINE HCL 10 MG PO CAPS: 10.0000 mg | ORAL_CAPSULE | Freq: Three times a day (TID) | ORAL | Status: DC

## 2015-04-02 NOTE — Progress Notes (Signed)
    History of Present Illness: This is a 45 year old female referred by Corwin LevinsJohn, James W, MD for the evaluation of chest pain76, abdominal pain, nausea, vomiting and diarrhea. She relates a four-month history of frequent lower anterior chest pain, epigastric, hypogastric and right upper quadrant pains that are intermittent and frequently associated with nausea and occasionally vomiting. They are also associated with episodes of urgent watery diarrhea. Occasionally radiates to her back. She can go for several days without symptoms in the next 2 or 3 days with symptoms. She cannot discern anything that trigger symptoms they are not necessarily related to meals or bowel movements or time of day. She relates no new medications started around the time her symptoms began. After having multiple loose watery stools she has occasionally noted small amounts of bright red blood on the tissue paper. She also relates marketed fatigue and achiness all over her body associated with these symptoms. Blood work last week reveiwed-it was normal. Right upper quadrant ultrasound in 12/2014 was normal. No prior EGD or colonoscopy. He is been treated with pantoprazole 40 mg daily with no change in symptoms. Denies weight loss, constipation, change in stool caliber, melena, dysphagia.  Review of Systems: Pertinent positive and negative review of systems were noted in the above HPI section. All other review of systems were otherwise negative.  Current Medications, Allergies, Past Medical History, Past Surgical History, Family History and Social History were reviewed in [facility] electronic medical record.  Physical Exam: General: Well developed, well nourished, no acute distress Head: Normocephalic and atraumatic Eyes:  sclerae anicteric, EOMI Ears: Normal auditory acuity Mouth: No deformity or lesions Neck: Supple, no masses or thyromegaly Lungs: Clear throughout to auscultation, no chest wall tenderness Heart: Regular  rate and rhythm; no murmurs, rubs or bruits Abdomen: Soft, mild epigastric and right upper quadrant tenderness and non distended. No masses, hepatosplenomegaly or hernias noted. Normal Bowel sounds Musculoskeletal: Symmetrical with no gross deformities  Skin: No lesions on visible extremities Pulses:  Normal pulses noted Extremities: No clubbing, cyanosis, edema or deformities noted Neurological: Alert oriented x 4, grossly nonfocal Cervical Nodes:  No significant cervical adenopathy Inguinal Nodes: No significant inguinal adenopathy Psychological:  Alert and cooperative. Normal mood and affect  Assessment and Recommendations:  1. Intermittent upper abdominal pain, lower chest pain, nausea, vomiting, diarrhea, small volume hematochezia, body aches, fatigue. Etiology unclear. Rule out ulcer, GERD, chronic acalculous cholecystitis, IBS, IBD. Avoid aspirin, NSAIDs and alcohol. Increase pantoprazole 40 mg twice daily. Start dicyclomine 10 mg 3 times a day. Start Zofran 4 mg tid prn. Schedule EGD. The risks (including bleeding, perforation, infection, missed lesions, medication reactions and possible hospitalization or surgery if complications occur), benefits, and alternatives to endoscopy with possible biopsy and possible dilation were discussed with the patient and they consent to proceed. Consider abdominal/pelvic CT if EGD negative. Will likely need to proceed with colonoscopy but would like to get her upper gastrointestinal complaints under better control to adequately tolerate a bowel prep.   cc: Corwin LevinsJames W John, MD [address] [place] [place], [place] [id number]

## 2015-04-02 NOTE — Patient Instructions (Addendum)
Increase your Protonix 40 mg to one tablet by mouth twice daily. We have sent a new prescription to your pharmacy.   We have sent the following medications to your pharmacy for you to pick up at your convenience: Bentyl, Zofran.  You have been scheduled for an endoscopy. Please follow written instructions given to you at your visit today. If you use inhalers (even only as needed), please bring them with you on the day of your procedure. Your physician has requested that you go to www.startemmi.com and enter the access code given to you at your visit today. This web site gives a general overview about your procedure. However, you should still follow specific instructions given to you by our office regarding your preparation for the procedure.  Thank you for choosing me and [facility].  Venita LickMalcolm T. Pleas KochStark, Jr., MD., Clementeen GrahamFACG

## 2015-04-02 NOTE — Addendum Note (Signed)
Addended by: Jessee AversLEWELLYN, Christia Domke L on: 04/02/2015 12:10 PM   Modules accepted: Orders

## 2015-04-07 ENCOUNTER — Encounter: Payer: Self-pay | Admitting: Gastroenterology

## 2015-04-22 ENCOUNTER — Encounter: Payer: Self-pay | Admitting: Gastroenterology

## 2015-04-22 ENCOUNTER — Ambulatory Visit (AMBULATORY_SURGERY_CENTER): Payer: BLUE CROSS/BLUE SHIELD | Admitting: Gastroenterology

## 2015-04-22 VITALS — BP 133/82 | HR 70 | Temp 99.6°F | Resp 25 | Ht 62.5 in | Wt 134.0 lb

## 2015-04-22 DIAGNOSIS — R1011 Right upper quadrant pain: Secondary | ICD-10-CM | POA: Diagnosis present

## 2015-04-22 DIAGNOSIS — R112 Nausea with vomiting, unspecified: Secondary | ICD-10-CM

## 2015-04-22 DIAGNOSIS — R079 Chest pain, unspecified: Secondary | ICD-10-CM | POA: Diagnosis not present

## 2015-04-22 MED ORDER — SODIUM CHLORIDE 0.9 % IV SOLN: 500.0000 mL | INTRAVENOUS | Status: DC

## 2015-04-22 NOTE — Op Note (Signed)
[facility] [address] [place], [address]   ENDOSCOPY PROCEDURE REPORT  PATIENT: Aimee Thompson, Aimee Thompson  MR#: [id number] BIRTHDATE: May 26, 1970 , 44  yrs. old GENDER: female ENDOSCOPIST: Meryl Dare, MD, Clementeen Graham REFERRED BY:  Oliver Barre, M.D. PROCEDURE DATE:  04/22/2015 PROCEDURE:  EGD, diagnostic ASA CLASS:     Class II INDICATIONS:  abdominal pain in the upper right quadrant, chest pain, nausea, and vomiting. MEDICATIONS: Monitored anesthesia care and Propofol 150 mg IV TOPICAL ANESTHETIC: none DESCRIPTION OF PROCEDURE: After the risks benefits and alternatives of the procedure were thoroughly explained, informed consent was obtained.  The LB [id number] endoscope was introduced through the mouth and advanced to the second portion of the duodenum , Without limitations.  The instrument was slowly withdrawn as the mucosa was fully examined.    EXAM: The esophagus and gastroesophageal junction were completely normal in appearance.  The stomach was entered and closely examined. The pylorus, antrum, body, angularis, and lesser curvature were well visualized, including a retroflexed view of the cardia and fundus.  The stomach wall was normally distensable.  The scope passed easily through the pylorus into the duodenum which appeared normal.  Retroflexed views revealed no abnormalities. The scope was then withdrawn from the patient and the procedure completed.  COMPLICATIONS: There were no immediate complications.  ENDOSCOPIC IMPRESSION: 1.  Normal appearing EGD  RECOMMENDATIONS: 1.  Continue current meds 2.  Begin FDgard 1-2 po bid 30 minutes before or 1 hour after meals  eSigned:  Meryl Dare, MD, [facility] 04/22/2015 3:38 PM

## 2015-04-22 NOTE — Progress Notes (Signed)
A/ox3, pleased with MAC, report to RN 

## 2015-04-22 NOTE — Patient Instructions (Signed)
YOU HAD AN ENDOSCOPIC PROCEDURE TODAY AT THE [facility]:   Refer to the procedure report that was given to you for any specific questions about what was found during the examination.  If the procedure report does not answer your questions, please call your gastroenterologist to clarify.  If you requested that your care partner not be given the details of your procedure findings, then the procedure report has been included in a sealed envelope for you to review at your convenience later.  YOU SHOULD EXPECT: Some feelings of bloating in the abdomen. Passage of more gas than usual.  Walking can help get rid of the air that was put into your GI tract during the procedure and reduce the bloating. If you had a lower endoscopy (such as a colonoscopy or flexible sigmoidoscopy) you may notice spotting of blood in your stool or on the toilet paper. If you underwent a bowel prep for your procedure, you may not have a normal bowel movement for a few days.  Please Note:  You might notice some irritation and congestion in your nose or some drainage.  This is from the oxygen used during your procedure.  There is no need for concern and it should clear up in a day or so.  SYMPTOMS TO REPORT IMMEDIATELY:    Following upper endoscopy (EGD)  Vomiting of blood or coffee ground material  New chest pain or pain under the shoulder blades  Painful or persistently difficult swallowing  New shortness of breath  Fever of 100F or higher  Black, tarry-looking stools  For urgent or emergent issues, a gastroenterologist can be reached at any hour by calling [phone].   DIET: Your first meal following the procedure should be a small meal and then it is ok to progress to your normal diet. Heavy or fried foods are harder to digest and may make you feel nauseous or bloated.  Likewise, meals heavy in dairy and vegetables can increase bloating.  Drink plenty of fluids but you should avoid alcoholic beverages  for 24 hours.  ACTIVITY:  You should plan to take it easy for the rest of today and you should NOT DRIVE or use heavy machinery until tomorrow (because of the sedation medicines used during the test).    FOLLOW UP: Our staff will call the number listed on your records the next business day following your procedure to check on you and address any questions or concerns that you may have regarding the information given to you following your procedure. If we do not reach you, we will leave a message.  However, if you are feeling well and you are not experiencing any problems, there is no need to return our call.  We will assume that you have returned to your regular daily activities without incident.  If any biopsies were taken you will be contacted by phone or by letter within the next 1-3 weeks.  Please call us at [phone] if you have not heard about the biopsies in 3 weeks.    SIGNATURES/CONFIDENTIALITY: You and/or your care partner have signed paperwork which will be entered into your electronic medical record.  These signatures attest to the fact that that the information above on your After Visit Summary has been reviewed and is understood.  Full responsibility of the confidentiality of this discharge information lies with you and/or your care-partner.  Continue current meds Begin FDgard over the counter to take twice a day 30 minutes before or 1 hour  after meals

## 2015-04-23 ENCOUNTER — Telehealth: Payer: Self-pay | Admitting: *Deleted

## 2015-04-23 ENCOUNTER — Other Ambulatory Visit: Payer: Self-pay | Admitting: Internal Medicine

## 2015-04-23 ENCOUNTER — Telehealth: Payer: Self-pay | Admitting: Internal Medicine

## 2015-04-23 MED ORDER — AMPHETAMINE-DEXTROAMPHETAMINE 10 MG PO TABS: 10.0000 mg | ORAL_TABLET | Freq: Two times a day (BID) | ORAL | Status: DC

## 2015-04-23 NOTE — Telephone Encounter (Signed)
Please advise 

## 2015-04-23 NOTE — Telephone Encounter (Signed)
  Follow up Call-  Call back number 04/22/2015  Post procedure Call Back phone  # [phone]  Permission to leave phone message Yes     Patient questions:  Message left to call us if necessary.

## 2015-04-23 NOTE — Telephone Encounter (Signed)
Pt requesting a refill on her Adderall XR 10 mg. I did not see this on her med list. She states you want her to take 1 in the morning and 1 at night.

## 2015-04-23 NOTE — Telephone Encounter (Signed)
Ok to change back to the bid dosing short acting adderall - Done hardcopy to [employer]

## 2015-04-23 NOTE — Telephone Encounter (Signed)
Medication printed signed and put up front for pick up

## 2015-05-22 ENCOUNTER — Encounter: Payer: Self-pay | Admitting: Gastroenterology

## 2015-05-22 NOTE — Telephone Encounter (Signed)
A user error has taken place.

## 2015-06-05 ENCOUNTER — Telehealth: Payer: Self-pay | Admitting: Internal Medicine

## 2015-06-05 MED ORDER — AMPHETAMINE-DEXTROAMPHETAMINE 10 MG PO TABS: 10.0000 mg | ORAL_TABLET | Freq: Two times a day (BID) | ORAL | Status: DC

## 2015-06-05 NOTE — Telephone Encounter (Signed)
Pt requesting refill for amphetamine-dextroamphetamine (ADDERALL) 10 MG tablet [[id number]][[id number]]

## 2015-06-05 NOTE — Telephone Encounter (Signed)
Please advise 

## 2015-06-05 NOTE — Telephone Encounter (Signed)
Done hardcopy to Corinne  

## 2015-06-08 ENCOUNTER — Telehealth: Payer: Self-pay | Admitting: Internal Medicine

## 2015-06-08 DIAGNOSIS — F329 Major depressive disorder, single episode, unspecified: Secondary | ICD-10-CM

## 2015-06-08 DIAGNOSIS — F32A Depression, unspecified: Secondary | ICD-10-CM

## 2015-06-08 NOTE — Telephone Encounter (Signed)
Pt would like a referral to see a psychologist.  Can you be referred?

## 2015-06-09 NOTE — Telephone Encounter (Signed)
Please advise, can this be done?

## 2015-06-09 NOTE — Telephone Encounter (Signed)
Referral for counseling has been done

## 2015-07-16 ENCOUNTER — Telehealth: Payer: Self-pay | Admitting: *Deleted

## 2015-07-16 MED ORDER — AMPHETAMINE-DEXTROAMPHETAMINE 10 MG PO TABS: 10.0000 mg | ORAL_TABLET | Freq: Two times a day (BID) | ORAL | Status: DC

## 2015-07-16 NOTE — Telephone Encounter (Signed)
Received call pt requesting refill on her alprazolam.../lmb 

## 2015-07-16 NOTE — Telephone Encounter (Signed)
adderall Done hardcopy to Corinne  

## 2015-07-16 NOTE — Telephone Encounter (Signed)
Notified pt w/MD response she stated its not for alprazolam it was for her Adderrall...Raechel Chute/lmb

## 2015-07-16 NOTE — Telephone Encounter (Signed)
Xanax seems to be too soon, since the rx in Jan 2017 was for total 6 mo; ok to let pt know

## 2015-07-16 NOTE — Telephone Encounter (Signed)
Medication placed up front for patient pickup 

## 2015-09-30 ENCOUNTER — Emergency Department (HOSPITAL_COMMUNITY): Payer: Medicaid Other

## 2015-09-30 ENCOUNTER — Encounter: Payer: Self-pay | Admitting: Gastroenterology

## 2015-09-30 ENCOUNTER — Encounter (HOSPITAL_COMMUNITY): Payer: Self-pay

## 2015-09-30 ENCOUNTER — Inpatient Hospital Stay (HOSPITAL_COMMUNITY)
Admission: EM | Admit: 2015-09-30 | Discharge: 2015-10-09 | DRG: 439 | Disposition: A | Discharge status: Home / Self Care | Payer: Medicaid Other | Attending: Internal Medicine | Admitting: Internal Medicine

## 2015-09-30 ENCOUNTER — Ambulatory Visit (INDEPENDENT_AMBULATORY_CARE_PROVIDER_SITE_OTHER): Payer: BLUE CROSS/BLUE SHIELD | Admitting: Gastroenterology

## 2015-09-30 VITALS — BP 110/80 | HR 68 | Ht 62.25 in | Wt 143.4 lb

## 2015-09-30 DIAGNOSIS — I10 Essential (primary) hypertension: Secondary | ICD-10-CM | POA: Diagnosis present

## 2015-09-30 DIAGNOSIS — F1029 Alcohol dependence with unspecified alcohol-induced disorder: Secondary | ICD-10-CM

## 2015-09-30 DIAGNOSIS — E876 Hypokalemia: Secondary | ICD-10-CM | POA: Diagnosis present

## 2015-09-30 DIAGNOSIS — J45909 Unspecified asthma, uncomplicated: Secondary | ICD-10-CM | POA: Diagnosis present

## 2015-09-30 DIAGNOSIS — I959 Hypotension, unspecified: Secondary | ICD-10-CM | POA: Diagnosis not present

## 2015-09-30 DIAGNOSIS — F1020 Alcohol dependence, uncomplicated: Secondary | ICD-10-CM | POA: Diagnosis present

## 2015-09-30 DIAGNOSIS — K8591 Acute pancreatitis with uninfected necrosis, unspecified: Secondary | ICD-10-CM | POA: Insufficient documentation

## 2015-09-30 DIAGNOSIS — F419 Anxiety disorder, unspecified: Secondary | ICD-10-CM | POA: Diagnosis present

## 2015-09-30 DIAGNOSIS — Z7951 Long term (current) use of inhaled steroids: Secondary | ICD-10-CM | POA: Diagnosis not present

## 2015-09-30 DIAGNOSIS — E872 Acidosis: Secondary | ICD-10-CM | POA: Diagnosis present

## 2015-09-30 DIAGNOSIS — K219 Gastro-esophageal reflux disease without esophagitis: Secondary | ICD-10-CM | POA: Diagnosis present

## 2015-09-30 DIAGNOSIS — E781 Pure hyperglyceridemia: Secondary | ICD-10-CM | POA: Diagnosis present

## 2015-09-30 DIAGNOSIS — E871 Hypo-osmolality and hyponatremia: Secondary | ICD-10-CM | POA: Diagnosis present

## 2015-09-30 DIAGNOSIS — F329 Major depressive disorder, single episode, unspecified: Secondary | ICD-10-CM | POA: Diagnosis present

## 2015-09-30 DIAGNOSIS — D696 Thrombocytopenia, unspecified: Secondary | ICD-10-CM | POA: Diagnosis present

## 2015-09-30 DIAGNOSIS — R1013 Epigastric pain: Secondary | ICD-10-CM | POA: Diagnosis not present

## 2015-09-30 DIAGNOSIS — F988 Other specified behavioral and emotional disorders with onset usually occurring in childhood and adolescence: Secondary | ICD-10-CM | POA: Diagnosis present

## 2015-09-30 DIAGNOSIS — R140 Abdominal distension (gaseous): Secondary | ICD-10-CM | POA: Insufficient documentation

## 2015-09-30 DIAGNOSIS — E162 Hypoglycemia, unspecified: Secondary | ICD-10-CM | POA: Diagnosis present

## 2015-09-30 DIAGNOSIS — D72819 Decreased white blood cell count, unspecified: Secondary | ICD-10-CM | POA: Diagnosis present

## 2015-09-30 DIAGNOSIS — Z880 Allergy status to penicillin: Secondary | ICD-10-CM

## 2015-09-30 DIAGNOSIS — E860 Dehydration: Secondary | ICD-10-CM | POA: Diagnosis present

## 2015-09-30 DIAGNOSIS — D638 Anemia in other chronic diseases classified elsewhere: Secondary | ICD-10-CM | POA: Diagnosis present

## 2015-09-30 DIAGNOSIS — R1084 Generalized abdominal pain: Secondary | ICD-10-CM

## 2015-09-30 DIAGNOSIS — R112 Nausea with vomiting, unspecified: Secondary | ICD-10-CM

## 2015-09-30 DIAGNOSIS — K859 Acute pancreatitis without necrosis or infection, unspecified: Secondary | ICD-10-CM | POA: Diagnosis present

## 2015-09-30 HISTORY — DX: Pure hyperglyceridemia: E78.1

## 2015-09-30 LAB — TRIGLYCERIDES

## 2015-09-30 LAB — COMPREHENSIVE METABOLIC PANEL
ALBUMIN: 3.6 g/dL (ref 3.5–5.0)
ALK PHOS: 83 U/L (ref 38–126)
ALT: 182 U/L — ABNORMAL HIGH (ref 14–54)
ANION GAP: 11 (ref 5–15)
AST: 241 U/L — ABNORMAL HIGH (ref 15–41)
BILIRUBIN TOTAL: 1.4 mg/dL — AB (ref 0.3–1.2)
BUN: 14 mg/dL (ref 6–20)
CALCIUM: 8.8 mg/dL — AB (ref 8.9–10.3)
CO2: 22 mmol/L (ref 22–32)
CREATININE: 0.76 mg/dL (ref 0.44–1.00)
Chloride: 100 mmol/L — ABNORMAL LOW (ref 101–111)
GFR calc non Af Amer: >60 mL/min (ref >60)
GLUCOSE: 132 mg/dL — AB (ref 65–99)
Potassium: 4.6 mmol/L (ref 3.5–5.1)
Sodium: 133 mmol/L — ABNORMAL LOW (ref 135–145)
TOTAL PROTEIN: 6.5 g/dL (ref 6.5–8.1)

## 2015-09-30 LAB — URINALYSIS, ROUTINE W REFLEX MICROSCOPIC
BILIRUBIN URINE: NEGATIVE
GLUCOSE, UA: NEGATIVE mg/dL
KETONES UR: NEGATIVE mg/dL
Nitrite: NEGATIVE
PROTEIN: NEGATIVE mg/dL
Specific Gravity, Urine: 1.028 (ref 1.005–1.030)
pH: 7 (ref 5.0–8.0)

## 2015-09-30 LAB — I-STAT BETA HCG BLOOD, ED (MC, WL, AP ONLY)

## 2015-09-30 LAB — URINE MICROSCOPIC-ADD ON

## 2015-09-30 LAB — I-STAT CG4 LACTIC ACID, ED: Lactic Acid, Venous: 4.31 mmol/L (ref 0.5–1.9)

## 2015-09-30 LAB — LIPASE, BLOOD: Lipase: 655 U/L — ABNORMAL HIGH (ref 11–51)

## 2015-09-30 MED ORDER — MOMETASONE FURO-FORMOTEROL FUM 200-5 MCG/ACT IN AERO
2.0000 | INHALATION_SPRAY | Freq: Two times a day (BID) | RESPIRATORY_TRACT | Status: DC
  Administered 2015-10-01 – 2015-10-09 (×11): 2 via RESPIRATORY_TRACT
  Filled 2015-09-30: qty 8.8

## 2015-09-30 MED ORDER — ALPRAZOLAM 0.25 MG PO TABS
0.2500 mg | ORAL_TABLET | Freq: Two times a day (BID) | ORAL | Status: DC | PRN
  Administered 2015-10-01 – 2015-10-09 (×7): 0.25 mg via ORAL
  Filled 2015-09-30 (×7): qty 1

## 2015-09-30 MED ORDER — SODIUM CHLORIDE 0.9 % IV SOLN
INTRAVENOUS | Status: DC
  Administered 2015-10-01 – 2015-10-06 (×5): via INTRAVENOUS

## 2015-09-30 MED ORDER — PANTOPRAZOLE SODIUM 40 MG PO TBEC
40.0000 mg | DELAYED_RELEASE_TABLET | Freq: Two times a day (BID) | ORAL | Status: DC
  Administered 2015-10-01 – 2015-10-09 (×18): 40 mg via ORAL
  Filled 2015-09-30 (×18): qty 1

## 2015-09-30 MED ORDER — SODIUM CHLORIDE 0.9 % IV BOLUS (SEPSIS)
1000.0000 mL | Freq: Once | INTRAVENOUS | Status: AC
  Administered 2015-09-30: 1000 mL via INTRAVENOUS

## 2015-09-30 MED ORDER — DIATRIZOATE MEGLUMINE & SODIUM 66-10 % PO SOLN
15.0000 mL | Freq: Once | ORAL | Status: AC
  Administered 2015-09-30: 15 mL via ORAL

## 2015-09-30 MED ORDER — LEVOFLOXACIN IN D5W 750 MG/150ML IV SOLN
750.0000 mg | Freq: Once | INTRAVENOUS | Status: AC
  Administered 2015-09-30: 750 mg via INTRAVENOUS
  Filled 2015-09-30: qty 150

## 2015-09-30 MED ORDER — IOPAMIDOL (ISOVUE-300) INJECTION 61%
100.0000 mL | Freq: Once | INTRAVENOUS | Status: AC | PRN
  Administered 2015-09-30: 100 mL via INTRAVENOUS

## 2015-09-30 MED ORDER — ESCITALOPRAM OXALATE 10 MG PO TABS
10.0000 mg | ORAL_TABLET | Freq: Every day | ORAL | Status: DC
  Administered 2015-10-01 – 2015-10-09 (×9): 10 mg via ORAL
  Filled 2015-09-30 (×9): qty 1

## 2015-09-30 MED ORDER — HYDROMORPHONE HCL 1 MG/ML IJ SOLN
0.5000 mg | Freq: Once | INTRAMUSCULAR | Status: AC
  Administered 2015-09-30: 0.5 mg via INTRAVENOUS
  Filled 2015-09-30: qty 1

## 2015-09-30 MED ORDER — DICYCLOMINE HCL 10 MG PO CAPS
10.0000 mg | ORAL_CAPSULE | Freq: Three times a day (TID) | ORAL | Status: DC
  Administered 2015-10-01: 10 mg via ORAL
  Filled 2015-09-30 (×10): qty 1

## 2015-09-30 MED ORDER — LEVOFLOXACIN IN D5W 750 MG/150ML IV SOLN: 750.0000 mg | INTRAVENOUS | Status: DC

## 2015-09-30 MED ORDER — VITAMIN B-1 100 MG PO TABS
100.0000 mg | ORAL_TABLET | Freq: Every day | ORAL | Status: DC
  Administered 2015-10-01 – 2015-10-09 (×8): 100 mg via ORAL
  Filled 2015-09-30 (×9): qty 1

## 2015-09-30 MED ORDER — ALBUTEROL SULFATE (2.5 MG/3ML) 0.083% IN NEBU: 3.0000 mL | INHALATION_SOLUTION | Freq: Four times a day (QID) | RESPIRATORY_TRACT | Status: DC | PRN

## 2015-09-30 MED ORDER — METRONIDAZOLE IN NACL 5-0.79 MG/ML-% IV SOLN
500.0000 mg | Freq: Four times a day (QID) | INTRAVENOUS | Status: DC
  Administered 2015-10-01: 500 mg via INTRAVENOUS
  Filled 2015-09-30: qty 100

## 2015-09-30 MED ORDER — METRONIDAZOLE IN NACL 5-0.79 MG/ML-% IV SOLN
500.0000 mg | Freq: Once | INTRAVENOUS | Status: AC
  Administered 2015-10-01: 500 mg via INTRAVENOUS
  Filled 2015-09-30: qty 100

## 2015-09-30 MED ORDER — HYDROMORPHONE HCL 1 MG/ML IJ SOLN
1.0000 mg | Freq: Once | INTRAMUSCULAR | Status: AC
  Administered 2015-09-30: 1 mg via INTRAVENOUS
  Filled 2015-09-30: qty 1

## 2015-09-30 MED ORDER — ONDANSETRON HCL 4 MG/2ML IJ SOLN
4.0000 mg | Freq: Once | INTRAMUSCULAR | Status: AC
  Administered 2015-09-30: 4 mg via INTRAVENOUS
  Filled 2015-09-30: qty 2

## 2015-09-30 MED ORDER — HYDROMORPHONE HCL 1 MG/ML IJ SOLN
0.5000 mg | INTRAMUSCULAR | Status: DC | PRN
  Administered 2015-10-01: 1 mg via INTRAVENOUS
  Filled 2015-09-30: qty 1

## 2015-09-30 NOTE — Progress Notes (Signed)
Pharmacy Antibiotic Note  Aimee Thompson is a 45 y.o. female admitted on 09/30/2015 with intra-abdominal infection.  Pharmacy has been consulted for metronidazole/levofloxacin dosing.   Dosage will likely remain stable and  further dosage adjustment appears unlikely at present.    Will sign off at this time.  Please reconsult if a change in clinical status warrants re-evaluation of dosage.   Plan: Metronidazole 500 mg IV q6h  Levofloxacin 750 mg IV q24h  Height: 5\' 2"  (157.5 cm) Weight: 143 lb (64.864 kg) IBW/kg (Calculated) : 50.1  Temp (24hrs), Avg:99.6 F (37.6 C), Min:99.1 F (37.3 C), Max:100.1 F (37.8 C)   Recent Labs Lab 09/30/15 1705 09/30/15 2145  CREATININE 0.76  --   LATICACIDVEN  --  4.31*    Estimated Creatinine Clearance: 78.5 mL/min (by C-G formula based on Cr of 0.76).    Allergies  Allergen Reactions  . Penicillins Hives    Has patient had a PCN reaction causing immediate rash, facial/tongue/throat swelling, SOB or lightheadedness with hypotension: no Has patient had a PCN reaction causing severe rash involving mucus membranes or skin necrosis: no Has patient had a PCN reaction that required hospitalization no Has patient had a PCN reaction occurring within the last 10 years: yes If all of the above answers are "NO", then may proceed with Cephalosporin use.  Caryl Never. Scallops [Shellfish Allergy] Itching and Other (See Comments)    Causes redness    Antimicrobials this admission: 7/12 metrondiazole >>  7/12 levofloxacin  >>   Dose adjustments this admission: ---  Microbiology results:   Thank you for allowing pharmacy to be a part of this patient's care.  Adalberto ColeNikola Lavern Maslow, PharmD, BCPS Pager [phone] 09/30/2015 10:04 PM

## 2015-09-30 NOTE — ED Notes (Signed)
Pt out for CT scan .  Will collect vitals when return.

## 2015-09-30 NOTE — Patient Instructions (Signed)
You need to go to the Emergency Department [facility] to be evaluated for your current symptoms.    Thank you for choosing me and [facility].  Venita LickMalcolm T. Pleas KochStark, Jr., MD., Clementeen GrahamFACG

## 2015-09-30 NOTE — ED Notes (Signed)
Per lab-CMP had to sent to lab due to a lot of fat in specimen

## 2015-09-30 NOTE — ED Provider Notes (Signed)
CSN: [id number]     Arrival date & time 09/30/15  1456 History   First MD Initiated Contact with Patient 09/30/15 2059     Chief Complaint  Patient presents with  . Abdominal Pain  . Emesis     (Consider location/radiation/quality/duration/timing/severity/associated sxs/prior Treatment) HPI  Blood pressure 152/96, pulse 63, temperature 100.1 F (37.8 C), temperature source Oral, resp. rate 16, height 5\' 2"  (1.575 m), weight 64.864 kg, last menstrual period 09/16/2015, SpO2 99 %.  Aimee Thompson is a 45 y.o. female complaining of severe epigastric abdominal pain radiating to the back onset yesterday today with multiple episodes of nonbloody, nonbilious, coffee-ground emesis. Patient states that she has had an appointment with her gastroenterologist whom she sees for irritable bowel, he sent her to the ED for rule out appendectomy. She reports tactile fever, chills no urinary or bowel symptoms. States that she drinks daily several glasses of wine, has never had any withdrawal from alcohol, states that she has high cholesterol she doesn't take medication for it is never had an episode of pancreatitis before.  Past Medical History  Diagnosis Date  . ASTHMA   . HEART MURMUR, HX OF   . ADD (attention deficit disorder)   . Hypertension    Past Surgical History  Procedure Laterality Date  . Laproscopy    . Shoulder surgery Right 2012  . Cesarean section  2011  . Knee surgery  1990   Family History  Problem Relation Age of Onset  . Hypertension Other   . Colon cancer Neg Hx   . Esophageal cancer Neg Hx   . Pancreatic cancer Neg Hx   . Colitis Neg Hx   . Crohn's disease Neg Hx   . Kidney disease Neg Hx   . Liver disease Neg Hx   . Heart disease Neg Hx   . Stomach cancer Neg Hx    Social History  Substance Use Topics  . Smoking status: Never Smoker   . Smokeless tobacco: Never Used  . Alcohol Use: 0.0 oz/week    0 Standard drinks or equivalent per week     Comment: 10  drinks per week   OB History    No data available     Review of Systems  10 systems reviewed and found to be negative, except as noted in the HPI.   Allergies  Penicillins and Scallops  Home Medications   Prior to Admission medications   Medication Sig Start Date End Date Taking? Authorizing Provider  albuterol (PROVENTIL HFA;VENTOLIN HFA) 108 (90 Base) MCG/ACT inhaler Inhale 2 puffs into the lungs every 6 (six) hours as needed for wheezing or shortness of breath. 03/26/15   Corwin Levins, MD  ALPRAZolam Prudy Feeler) 0.25 MG tablet Take 1 tablet (0.25 mg total) by mouth 2 (two) times daily as needed for anxiety. 03/26/15   Corwin Levins, MD  amphetamine-dextroamphetamine (ADDERALL) 10 MG tablet Take 1 tablet (10 mg total) by mouth 2 (two) times daily. 07/16/15   Corwin Levins, MD  budesonide-formoterol [facility]) 160-4.5 MCG/ACT inhaler Inhale 2 puffs into the lungs 2 (two) times daily. For Shortness of breath Patient taking differently: Inhale 2 puffs into the lungs 2 (two) times daily as needed (For shortness of breath.).  06/04/13   Corwin Levins, MD  dicyclomine (BENTYL) 10 MG capsule Take 1 capsule (10 mg total) by mouth 3 (three) times daily before meals. 04/02/15   Meryl Dare, MD  escitalopram (LEXAPRO) 10 MG tablet Take 1  tablet (10 mg total) by mouth daily. 03/26/15 09/30/15  Corwin LevinsJames W John, MD  ibuprofen (ADVIL,MOTRIN) 200 MG tablet Take 400 mg by mouth every 6 (six) hours as needed (For cramping.). Reported on 03/26/2015    Historical Provider, MD  metoprolol succinate (TOPROL-XL) 50 MG 24 hr tablet TAKE ONE TABLET BY MOUTH EVERY MORNING. TAKE WITH OR IMMEDIATELY FOLLOWING A MEAL 04/23/15   Corwin LevinsJames W John, MD  ondansetron [facility](ZOFRAN) 4 MG tablet Take 1 tablet (4 mg total) by mouth 3 (three) times daily as needed for nausea or vomiting. 04/02/15   Meryl DareMalcolm T Stark, MD  pantoprazole (PROTONIX) 40 MG tablet Take 1 tablet (40 mg total) by mouth 2 (two) times daily. 04/02/15   Meryl DareMalcolm T Stark, MD  potassium  chloride SA (K-DUR,KLOR-CON) 20 MEQ tablet Take 1 tablet (20 mEq total) by mouth 2 (two) times daily. 03/26/15 09/30/15  Corwin LevinsJames W John, MD   BP 152/96 mmHg  Pulse 63  Temp(Src) 100.1 F (37.8 C) (Oral)  Resp 16  Ht 5\' 2"  (1.575 m)  Wt 64.864 kg  BMI 26.15 kg/m2  SpO2 99%  LMP 09/16/2015 Physical Exam  Constitutional: She is oriented to person, place, and time. She appears well-developed and well-nourished. No distress.  HENT:  Head: Normocephalic and atraumatic.  Mouth/Throat: Oropharynx is clear and moist.  Eyes: Conjunctivae and EOM are normal. Pupils are equal, round, and reactive to light.  Neck: Normal range of motion.  Cardiovascular: Normal rate.   Pulmonary/Chest: Effort normal. No stridor.  Abdominal: Soft. Bowel sounds are normal. She exhibits no distension and no mass. There is tenderness. There is no rebound and no guarding.  Extremely tender palpation in the upper quadrants with no guarding or rebound  Musculoskeletal: Normal range of motion.  Neurological: She is alert and oriented to person, place, and time.  Psychiatric: She has a normal mood and affect.  Nursing note and vitals reviewed.   ED Course  Procedures (including critical care time) Labs Review Labs Reviewed  LIPASE, BLOOD - Abnormal; Notable for the following:    Lipase 655 (*)    All other components within normal limits  COMPREHENSIVE METABOLIC PANEL - Abnormal; Notable for the following:    Sodium 133 (*)    Chloride 100 (*)    Glucose, Bld 132 (*)    Calcium 8.8 (*)    AST 241 (*)    ALT 182 (*)    Total Bilirubin 1.4 (*)    All other components within normal limits  URINALYSIS, ROUTINE W REFLEX MICROSCOPIC (NOT AT [facility]) - Abnormal; Notable for the following:    Color, Urine AMBER (*)    APPearance CLOUDY (*)    Hgb urine dipstick MODERATE (*)    Leukocytes, UA SMALL (*)    All other components within normal limits  URINE MICROSCOPIC-ADD ON - Abnormal; Notable for the following:     Squamous Epithelial / LPF 6-30 (*)    Bacteria, UA MANY (*)    All other components within normal limits  CBC    Imaging Review No results found. I have personally reviewed and evaluated these images and lab results as part of my medical decision-making.   EKG Interpretation None      MDM   Final diagnoses:  None    Filed Vitals:   09/30/15 1505 09/30/15 1824 09/30/15 2245  BP: 125/85 152/96 142/103  Pulse: 75 63 75  Temp: 99.1 F (37.3 C) 100.1 F (37.8 C) 99.1 F (37.3 C)  TempSrc: Oral Oral Oral  Resp: Height:  (1.575 m)    Weight: 64.864 kg    SpO2: 99% 99% 100%    Medications  sodium chloride 0.9 % bolus 1,000 mL (1,000 mLs Intravenous New Bag/Given 09/30/15 2250)    And  sodium chloride 0.9 % bolus 1,000 mL (1,000 mLs Intravenous New Bag/Given 09/30/15 2304)  levofloxacin (LEVAQUIN) IVPB 750 mg (750 mg Intravenous New Bag/Given 09/30/15 2248)  metroNIDAZOLE (FLAGYL) IVPB 500 mg (not administered)  metroNIDAZOLE (FLAGYL) IVPB 500 mg (not administered)  levofloxacin (LEVAQUIN) IVPB 750 mg (not administered)  HYDROmorphone (DILAUDID) injection 1 mg (not administered)  sodium chloride 0.9 % bolus 1,000 mL (0 mLs Intravenous Stopped 09/30/15 2250)  HYDROmorphone (DILAUDID) injection 0.5 mg (0.5 mg Intravenous Given 09/30/15 2126)  ondansetron (ZOFRAN) injection 4 mg (4 mg Intravenous Given 09/30/15 2126)  diatrizoate meglumine-sodium (GASTROGRAFIN) 66-10 % solution 15 mL (15 mLs Oral Given 09/30/15 2230)  iopamidol (ISOVUE-300) 61 % injection 100 mL (100 mLs Intravenous Contrast Given 09/30/15 2231)    Aimee Thompson is 45 y.o. female presenting with Severe epigastric pain radiating to the back onset yesterday associated with several episodes of emesis today. Patient with nonsurgical abdomen but very tender to palpation, mildly elevated temperature at 100.1. Sent from GI to rule out appendectomy. Patient with significantly elevated lipase at 655.  States that there is a lot of fat in this patient's blood, pancreatitis may be from hypertriglyceridemia. Triglycerides and pending. She does drink alcohol regularly, has never had an episode of pancreatitis before. Does not appear to be in acute withdrawal. Transaminitis of AST to 41 ALT 182significant elevation in bilirubin. CT abdomen pelvis with what appears to be an uncomplicated pancreatitis, pseudocyst, normal biliary tree. Lactic acid is significantly elevated at 4.31, sepsis protocol initiated. CBC pending.  Julian Reil to admit and put in holding orders   Wynetta Emery, PA-C 09/30/15 2316  Benjiman Core, MD 10/01/15 [phone]

## 2015-09-30 NOTE — ED Notes (Signed)
Lab will add on pt diff. Due to high lipase in the plasma, lab informed that the CBC will take longer than normal. PA made aware.

## 2015-09-30 NOTE — Progress Notes (Signed)
History of Present Illness: This is a 45 year old female complaining of generalized abdominal pain for 1 day. She notes SOB, pain in her mid back, nausea and vomiting. Typically has loose stools but stools have been formed for 2 days and she is passing flatus. Unable to hold down medications or fluids today. Dicyclomine yesterday did not help. No fevers or chills. Denies weight loss, constipation, diarrhea, change in stool caliber, melena, hematochezia, dysphagia, reflux symptoms, chest pain.   Allergies  Allergen Reactions  . Penicillins Hives    Has patient had a PCN reaction causing immediate rash, facial/tongue/throat swelling, SOB or lightheadedness with hypotension: no Has patient had a PCN reaction causing severe rash involving mucus membranes or skin necrosis: no Has patient had a PCN reaction that required hospitalization no Has patient had a PCN reaction occurring within the last 10 years: yes If all of the above answers are "NO", then may proceed with Cephalosporin use.  Caryl Never. Scallops [Shellfish Allergy] Itching and Other (See Comments)    Causes redness   Outpatient Prescriptions Prior to Visit  Medication Sig Dispense Refill  . albuterol (PROVENTIL HFA;VENTOLIN HFA) 108 (90 Base) MCG/ACT inhaler Inhale 2 puffs into the lungs every 6 (six) hours as needed for wheezing or shortness of breath. 1 Inhaler 5  . ALPRAZolam (XANAX) 0.25 MG tablet Take 1 tablet (0.25 mg total) by mouth 2 (two) times daily as needed for anxiety. 60 tablet 5  . amphetamine-dextroamphetamine (ADDERALL) 10 MG tablet Take 1 tablet (10 mg total) by mouth 2 (two) times daily. 60 tablet 0  . budesonide-formoterol (SYMBICORT) 160-4.5 MCG/ACT inhaler Inhale 2 puffs into the lungs 2 (two) times daily. For Shortness of breath (Patient taking differently: Inhale 2 puffs into the lungs 2 (two) times daily as needed (For shortness of breath.). ) 1 Inhaler 6  . dicyclomine (BENTYL) 10 MG capsule Take 1 capsule (10 mg  total) by mouth 3 (three) times daily before meals. 90 capsule 5  . escitalopram (LEXAPRO) 10 MG tablet Take 1 tablet (10 mg total) by mouth daily. 90 tablet 3  . ibuprofen (ADVIL,MOTRIN) 200 MG tablet Take 400 mg by mouth every 6 (six) hours as needed (For cramping.). Reported on 03/26/2015    . metoprolol succinate (TOPROL-XL) 50 MG 24 hr tablet TAKE ONE TABLET BY MOUTH EVERY MORNING. TAKE WITH OR IMMEDIATELY FOLLOWING A MEAL 90 tablet 2  . ondansetron (ZOFRAN) 4 MG tablet Take 1 tablet (4 mg total) by mouth 3 (three) times daily as needed for nausea or vomiting. 30 tablet 0  . pantoprazole (PROTONIX) 40 MG tablet Take 1 tablet (40 mg total) by mouth 2 (two) times daily. 60 tablet 5  . potassium chloride SA (K-DUR,KLOR-CON) 20 MEQ tablet Take 1 tablet (20 mEq total) by mouth 2 (two) times daily. 180 tablet 1   No facility-administered medications prior to visit.   Past Medical History  Diagnosis Date  . ASTHMA   . HEART MURMUR, HX OF   . ADD (attention deficit disorder)   . Hypertension    Past Surgical History  Procedure Laterality Date  . Laproscopy    . Shoulder surgery Right 2012  . Cesarean section  2011  . Knee surgery  1990   Social History   Social History  . Marital Status: Single    Spouse Name: N/A  . Number of Children: N/A  . Years of Education: N/A   Occupational History  . homemaker    Social History Main  Topics  . Smoking status: Never Smoker   . Smokeless tobacco: Never Used  . Alcohol Use: 0.0 oz/week    0 Standard drinks or equivalent per week     Comment: 10 drinks per week  . Drug Use: No  . Sexual Activity: Not Asked   Other Topics Concern  . None   Social History Narrative   Family History  Problem Relation Age of Onset  . Hypertension Other   . Colon cancer Neg Hx   . Esophageal cancer Neg Hx   . Pancreatic cancer Neg Hx   . Colitis Neg Hx   . Crohn's disease Neg Hx   . Kidney disease Neg Hx   . Liver disease Neg Hx   . Heart disease  Neg Hx   . Stomach cancer Neg Hx       Physical Exam: General: Well developed, well nourished, uncomfortable Head: Normocephalic and atraumatic Eyes:  sclerae anicteric, EOMI Ears: Normal auditory acuity Mouth: No deformity or lesions Lungs: Clear throughout to auscultation Heart: Regular rate and rhythm; no murmurs, rubs or bruits Abdomen: Soft, generalized tenderness, and non distended. No masses, hepatosplenomegaly or hernias noted. Hypoactive bowel sounds Rectal: no lesions, no tenderness, heme neg brown stool in the rectal vault  Musculoskeletal: Symmetrical with no gross deformities  Pulses:  Normal pulses noted Extremities: No clubbing, cyanosis, edema or deformities noted Neurological: Alert oriented x 4, grossly nonfocal Psychological:  Alert and cooperative. Normal mood and affect  Assessment and Recommendations:  1. Acute generalized abdominal pain, back pain, nausea and vomiting. Etiology unclear. Needs abd imaging, stat blood work, IV fluids. ED evaluation recommended and she agrees to go directly to [facility] with significant other.

## 2015-09-30 NOTE — ED Notes (Signed)
I attempted to collect  Labs and was unsuccessful.

## 2015-09-30 NOTE — ED Notes (Addendum)
Pt c/o generalized abdominal pain radiating into back and n/v x 1 day.  Pain score 7/10.  Denies diarrhea.  Pt reports that she was seen by GI MD(Stark) today and directed to ED for acute appy rule out.  Pt reports taking Zofran w/ relief.

## 2015-10-01 ENCOUNTER — Encounter (HOSPITAL_COMMUNITY): Payer: Self-pay | Admitting: Internal Medicine

## 2015-10-01 DIAGNOSIS — F1029 Alcohol dependence with unspecified alcohol-induced disorder: Secondary | ICD-10-CM

## 2015-10-01 DIAGNOSIS — K859 Acute pancreatitis without necrosis or infection, unspecified: Secondary | ICD-10-CM

## 2015-10-01 DIAGNOSIS — E781 Pure hyperglyceridemia: Secondary | ICD-10-CM | POA: Diagnosis present

## 2015-10-01 DIAGNOSIS — K858 Other acute pancreatitis without necrosis or infection: Secondary | ICD-10-CM

## 2015-10-01 LAB — DIFFERENTIAL
BASOS ABS: 0 10*3/uL (ref 0.0–0.1)
Basophils Relative: 0 %
Eosinophils Absolute: 0 10*3/uL (ref 0.0–0.7)
Eosinophils Relative: 0 %
LYMPHS ABS: 0.8 10*3/uL (ref 0.7–4.0)
Lymphocytes Relative: 8 %
MONO ABS: 1.1 10*3/uL — AB (ref 0.1–1.0)
MONOS PCT: 10 %
NEUTROS ABS: 8.7 10*3/uL — AB (ref 1.7–7.7)
NEUTROS PCT: 82 %

## 2015-10-01 LAB — GLUCOSE, CAPILLARY
GLUCOSE-CAPILLARY: 149 mg/dL — AB (ref 65–99)
GLUCOSE-CAPILLARY: 181 mg/dL — AB (ref 65–99)
GLUCOSE-CAPILLARY: 159 mg/dL — AB (ref 65–99)
GLUCOSE-CAPILLARY: 130 mg/dL — AB (ref 65–99)
GLUCOSE-CAPILLARY: 95 mg/dL (ref 65–99)
GLUCOSE-CAPILLARY: 132 mg/dL — AB (ref 65–99)
Glucose-Capillary: 111 mg/dL — ABNORMAL HIGH (ref 65–99)
Glucose-Capillary: 173 mg/dL — ABNORMAL HIGH (ref 65–99)
Glucose-Capillary: 95 mg/dL (ref 65–99)
Glucose-Capillary: 63 mg/dL — ABNORMAL LOW (ref 65–99)
Glucose-Capillary: 74 mg/dL (ref 65–99)
Glucose-Capillary: 92 mg/dL (ref 65–99)
Glucose-Capillary: 96 mg/dL (ref 65–99)
Glucose-Capillary: 66 mg/dL (ref 65–99)

## 2015-10-01 LAB — BASIC METABOLIC PANEL
Anion gap: 8 (ref 5–15)
Anion gap: 9 (ref 5–15)
BUN: 6 mg/dL (ref 6–20)
BUN: <5 mg/dL — ABNORMAL LOW (ref 6–20)
CHLORIDE: 97 mmol/L — AB (ref 101–111)
CO2: 22 mmol/L (ref 22–32)
CO2: 22 mmol/L (ref 22–32)
CREATININE: 0.75 mg/dL (ref 0.44–1.00)
Calcium: 6.9 mg/dL — ABNORMAL LOW (ref 8.9–10.3)
Calcium: 6.5 mg/dL — ABNORMAL LOW (ref 8.9–10.3)
Chloride: 105 mmol/L (ref 101–111)
Creatinine, Ser: 0.75 mg/dL (ref 0.44–1.00)
GFR calc non Af Amer: >60 mL/min (ref >60)
Glucose, Bld: 102 mg/dL — ABNORMAL HIGH (ref 65–99)
Glucose, Bld: 75 mg/dL (ref 65–99)
POTASSIUM: 4 mmol/L (ref 3.5–5.1)
POTASSIUM: 3.7 mmol/L (ref 3.5–5.1)
SODIUM: 135 mmol/L (ref 135–145)
Sodium: 128 mmol/L — ABNORMAL LOW (ref 135–145)

## 2015-10-01 LAB — CBC
HCT: 44.4 % (ref 36.0–46.0)
Hemoglobin: 15.8 g/dL — ABNORMAL HIGH (ref 12.0–15.0)
MCH: 33.8 pg (ref 26.0–34.0)
MCHC: 35.6 g/dL (ref 30.0–36.0)
MCV: 94.9 fL (ref 78.0–100.0)
PLATELETS: 241 10*3/uL (ref 150–400)
RBC: 4.68 MIL/uL (ref 3.87–5.11)
RDW: 12.6 % (ref 11.5–15.5)
WBC: 10.6 10*3/uL — ABNORMAL HIGH (ref 4.0–10.5)

## 2015-10-01 LAB — LIPASE, BLOOD: LIPASE: 1188 U/L — AB (ref 11–51)

## 2015-10-01 LAB — LACTIC ACID, PLASMA
LACTIC ACID, VENOUS: 2.2 mmol/L — AB (ref 0.5–1.9)
LACTIC ACID, VENOUS: 1.7 mmol/L (ref 0.5–1.9)
Lactic Acid, Venous: 3.3 mmol/L (ref 0.5–1.9)

## 2015-10-01 LAB — LACTATE DEHYDROGENASE: LDH: 295 U/L — AB (ref 98–192)

## 2015-10-01 LAB — MRSA PCR SCREENING: MRSA by PCR: NEGATIVE

## 2015-10-01 MED ORDER — ACETAMINOPHEN 650 MG RE SUPP
650.0000 mg | Freq: Once | RECTAL | Status: AC
Start: 2015-10-01 — End: 2015-10-01
  Administered 2015-10-01: 650 mg via RECTAL
  Filled 2015-10-01: qty 1

## 2015-10-01 MED ORDER — ENOXAPARIN SODIUM 40 MG/0.4ML ~~LOC~~ SOLN
40.0000 mg | Freq: Every day | SUBCUTANEOUS | Status: DC
  Administered 2015-10-01 – 2015-10-08 (×9): 40 mg via SUBCUTANEOUS
  Filled 2015-10-01 (×9): qty 0.4

## 2015-10-01 MED ORDER — DEXTROSE 10 % IV SOLN
INTRAVENOUS | Status: DC
  Administered 2015-10-01 – 2015-10-07 (×15): via INTRAVENOUS
  Filled 2015-10-01 (×14): qty 1000

## 2015-10-01 MED ORDER — SODIUM CHLORIDE 0.9 % IV BOLUS (SEPSIS)
500.0000 mL | Freq: Once | INTRAVENOUS | Status: AC
  Administered 2015-10-01: 500 mL via INTRAVENOUS

## 2015-10-01 MED ORDER — DEXTROSE 50 % IV SOLN
50.0000 mL | INTRAVENOUS | Status: DC | PRN
  Administered 2015-10-01 – 2015-10-03 (×4): 50 mL via INTRAVENOUS
  Filled 2015-10-01 (×2): qty 50

## 2015-10-01 MED ORDER — FUROSEMIDE 10 MG/ML IJ SOLN
20.0000 mg | Freq: Once | INTRAMUSCULAR | Status: DC
  Filled 2015-10-01: qty 2

## 2015-10-01 MED ORDER — HYDROMORPHONE HCL 1 MG/ML IJ SOLN
1.0000 mg | INTRAMUSCULAR | Status: DC | PRN
  Administered 2015-10-01 – 2015-10-03 (×16): 1 mg via INTRAVENOUS
  Filled 2015-10-01 (×16): qty 1

## 2015-10-01 MED ORDER — ONDANSETRON HCL 4 MG/2ML IJ SOLN
4.0000 mg | Freq: Four times a day (QID) | INTRAMUSCULAR | Status: DC | PRN
  Administered 2015-10-01 – 2015-10-02 (×5): 4 mg via INTRAVENOUS
  Filled 2015-10-01 (×5): qty 2

## 2015-10-01 MED ORDER — SODIUM CHLORIDE 0.9 % IV SOLN: 0.5000 [IU]/h | INTRAVENOUS | Status: DC

## 2015-10-01 MED ORDER — FENOFIBRATE 160 MG PO TABS
160.0000 mg | ORAL_TABLET | Freq: Every day | ORAL | Status: DC
  Administered 2015-10-01 – 2015-10-09 (×9): 160 mg via ORAL
  Filled 2015-10-01 (×9): qty 1

## 2015-10-01 MED ORDER — FAMOTIDINE 20 MG PO TABS
20.0000 mg | ORAL_TABLET | Freq: Two times a day (BID) | ORAL | Status: DC | PRN
  Administered 2015-10-01: 20 mg via ORAL
  Filled 2015-10-01: qty 1

## 2015-10-01 MED ORDER — SODIUM CHLORIDE 0.9 % IV SOLN
0.1000 [IU]/kg/h | INTRAVENOUS | Status: DC
  Administered 2015-10-01: 0.1 [IU]/kg/h via INTRAVENOUS
  Filled 2015-10-01: qty 2.5

## 2015-10-01 MED ORDER — SODIUM CHLORIDE 0.9 % IV SOLN
0.0900 [IU]/kg/h | INTRAVENOUS | Status: DC
  Administered 2015-10-02: 0.1 [IU]/kg/h via INTRAVENOUS
  Filled 2015-10-01 (×3): qty 2.5

## 2015-10-01 NOTE — H&P (Addendum)
History and Physical    Aimee Thompson FAO:[id number] DOB: September 05, 1970 DOA: 09/30/2015   PCP: Oliver Barre, MD Chief Complaint:  Chief Complaint  Patient presents with  . Abdominal Pain  . Emesis    HPI: Aimee Thompson is a 45 y.o. female with medical history significant of HTN, familial hypertriglyceridemia, patient has 1 day history of epigastric abdominal pain.  Pain radiates to back, is severe, has nausea and vomiting that was severe and she couldn't hold down meds today so she went to [facility] for same day office visit.  They sent her to ED for eval of acute abdominal pain, N/V.  She does drink wine daily.  ED Course: Found to have acute pancreatitis.  Review of Systems: As per HPI otherwise 10 point review of systems negative.    Past Medical History  Diagnosis Date  . ASTHMA   . HEART MURMUR, HX OF   . ADD (attention deficit disorder)   . Hypertension   . Hypertriglyceridemia, familial     Past Surgical History  Procedure Laterality Date  . Laproscopy    . Shoulder surgery Right 2012  . Cesarean section  2011  . Knee surgery  1990     reports that she has never smoked. She has never used smokeless tobacco. She reports that she drinks alcohol. She reports that she does not use illicit drugs.  Allergies  Allergen Reactions  . Penicillins Hives    Has patient had a PCN reaction causing immediate rash, facial/tongue/throat swelling, SOB or lightheadedness with hypotension: no Has patient had a PCN reaction causing severe rash involving mucus membranes or skin necrosis: no Has patient had a PCN reaction that required hospitalization no Has patient had a PCN reaction occurring within the last 10 years: yes If all of the above answers are "NO", then may proceed with Cephalosporin use.  Denyse Amass Allergy] Itching and Other (See Comments)    Causes redness    Family History  Problem Relation Age of Onset  . Hypertension Other   . Colon cancer  Neg Hx   . Esophageal cancer Neg Hx   . Pancreatic cancer Neg Hx   . Colitis Neg Hx   . Crohn's disease Neg Hx   . Kidney disease Neg Hx   . Liver disease Neg Hx   . Heart disease Neg Hx   . Stomach cancer Neg Hx       Prior to Admission medications   Medication Sig Start Date End Date Taking? Authorizing Provider  albuterol (PROVENTIL HFA;VENTOLIN HFA) 108 (90 Base) MCG/ACT inhaler Inhale 2 puffs into the lungs every 6 (six) hours as needed for wheezing or shortness of breath. 03/26/15  Yes Corwin Levins, MD  ALPRAZolam Prudy Feeler) 0.25 MG tablet Take 1 tablet (0.25 mg total) by mouth 2 (two) times daily as needed for anxiety. 03/26/15  Yes Corwin Levins, MD  amphetamine-dextroamphetamine (ADDERALL) 10 MG tablet Take 1 tablet (10 mg total) by mouth 2 (two) times daily. 07/16/15  Yes Corwin Levins, MD  budesonide-formoterol [facility]) 160-4.5 MCG/ACT inhaler Inhale 2 puffs into the lungs 2 (two) times daily. For Shortness of breath Patient taking differently: Inhale 2 puffs into the lungs 2 (two) times daily as needed (For shortness of breath.).  06/04/13  Yes Corwin Levins, MD  dicyclomine (BENTYL) 10 MG capsule Take 1 capsule (10 mg total) by mouth 3 (three) times daily before meals. 04/02/15  Yes Meryl Dare, MD  escitalopram Judye Bos)  10 MG tablet Take 1 tablet (10 mg total) by mouth daily. 03/26/15 09/30/15 Yes Corwin Levins, MD  ibuprofen (ADVIL,MOTRIN) 200 MG tablet Take 400 mg by mouth every 6 (six) hours as needed (For cramping.). Reported on 03/26/2015   Yes Historical Provider, MD  metoprolol succinate (TOPROL-XL) 50 MG 24 hr tablet TAKE ONE TABLET BY MOUTH EVERY MORNING. TAKE WITH OR IMMEDIATELY FOLLOWING A MEAL Patient taking differently: TAKE 50 MG  BY MOUTH EVERY MORNING. TAKE WITH OR IMMEDIATELY FOLLOWING A MEAL 04/23/15  Yes Corwin Levins, MD  ondansetron [facility]) 4 MG tablet Take 1 tablet (4 mg total) by mouth 3 (three) times daily as needed for nausea or vomiting. 04/02/15  Yes Meryl Dare, MD  pantoprazole (PROTONIX) 40 MG tablet Take 1 tablet (40 mg total) by mouth 2 (two) times daily. 04/02/15  Yes Meryl Dare, MD  thiamine 100 MG tablet Take 100 mg by mouth daily.   Yes Historical Provider, MD    Physical Exam: Filed Vitals:   09/30/15 1505 09/30/15 1824 09/30/15 2245  BP: 125/85 152/96 142/103  Pulse: 75 63 75  Temp: 99.1 F (37.3 C) 100.1 F (37.8 C) 99.1 F (37.3 C)  TempSrc: Oral Oral Oral  Resp: Height:  (1.575 m)    Weight: 64.864 kg (143 lb)    SpO2: 99% 99% 100%      Constitutional: NAD, calm, comfortable Eyes: PERRL, lids and conjunctivae normal ENMT: Mucous membranes are moist. Posterior pharynx clear of any exudate or lesions.Normal dentition.  Neck: normal, supple, no masses, no thyromegaly Respiratory: clear to auscultation bilaterally, no wheezing, no crackles. Normal respiratory effort. No accessory muscle use.  Cardiovascular: Regular rate and rhythm, no murmurs / rubs / gallops. No extremity edema. 2+ pedal pulses. No carotid bruits.  Abdomen: no tenderness, no masses palpated. No hepatosplenomegaly. Bowel sounds positive.  Musculoskeletal: no clubbing / cyanosis. No joint deformity upper and lower extremities. Good ROM, no contractures. Normal muscle tone.  Skin: no rashes, lesions, ulcers. No induration Neurologic: CN 2-12 grossly intact. Sensation intact, DTR normal. Strength 5/5 in all 4.  Psychiatric: Normal judgment and insight. Alert and oriented x 3. Normal mood.    Labs on Admission: I have personally reviewed following labs and imaging studies  CBC:  Recent Labs Lab 09/30/15 2230  WBC 10.6*  NEUTROABS 8.7*  HGB 15.8*  HCT 44.4  MCV 94.9  PLT 241   Basic Metabolic Panel:  Recent Labs Lab 09/30/15 1705  NA 133*  K 4.6  CL 100*  CO2 22  GLUCOSE 132*  BUN 14  CREATININE 0.76  CALCIUM 8.8*   GFR: Estimated Creatinine Clearance: 78.5 mL/min (by C-G formula based on Cr of 0.76). Liver  Function Tests:  Recent Labs Lab 09/30/15 1705  AST 241*  ALT 182*  ALKPHOS 83  BILITOT 1.4*  PROT 6.5  ALBUMIN 3.6    Recent Labs Lab 09/30/15 1705  LIPASE 655*   No results for input(s): AMMONIA in the last 168 hours. Coagulation Profile: No results for input(s): INR, PROTIME in the last 168 hours. Cardiac Enzymes: No results for input(s): CKTOTAL, CKMB, CKMBINDEX, TROPONINI in the last 168 hours. BNP (last 3 results) No results for input(s): PROBNP in the last 8760 hours. HbA1C: No results for input(s): HGBA1C in the last 72 hours. CBG: No results for input(s): GLUCAP in the last 168 hours. Lipid Profile:  Recent Labs  09/30/15 2129  TRIG >5000*  Thyroid Function Tests: No results for input(s): TSH, T4TOTAL, FREET4, T3FREE, THYROIDAB in the last 72 hours. Anemia Panel: No results for input(s): VITAMINB12, FOLATE, FERRITIN, TIBC, IRON, RETICCTPCT in the last 72 hours. Urine analysis:    Component Value Date/Time   COLORURINE AMBER* 09/30/2015 1823   APPEARANCEUR CLOUDY* 09/30/2015 1823   LABSPEC 1.028 09/30/2015 1823   PHURINE 7.0 09/30/2015 1823   GLUCOSEU NEGATIVE 09/30/2015 1823   GLUCOSEU NEGATIVE 03/26/2015 1704   HGBUR MODERATE* 09/30/2015 1823   BILIRUBINUR NEGATIVE 09/30/2015 1823   KETONESUR NEGATIVE 09/30/2015 1823   PROTEINUR NEGATIVE 09/30/2015 1823   UROBILINOGEN 0.2 03/26/2015 1704   NITRITE NEGATIVE 09/30/2015 1823   LEUKOCYTESUR SMALL* 09/30/2015 1823   Sepsis Labs: @LABRCNTIP (procalcitonin:4,lacticidven:4) )No results found for this or any previous visit (from the past 240 hour(s)).   Radiological Exams on Admission: Ct Abdomen Pelvis W Contrast  09/30/2015  CLINICAL DATA:  Generalized abdominal pain radiating into the back. Nausea and vomiting for 1 day. EXAM: CT ABDOMEN AND PELVIS WITH CONTRAST TECHNIQUE: Multidetector CT imaging of the abdomen and pelvis was performed using the standard protocol following bolus administration of  intravenous contrast. CONTRAST:  100mL ISOVUE-300 IOPAMIDOL (ISOVUE-300) INJECTION 61% COMPARISON:  07/06/2005 FINDINGS: Lower chest:  No acute findings. Hepatobiliary: No masses or other significant abnormality. Pancreas: Mild peripancreatic inflammatory changes no old fluid collection to suggest an abscess or pseudocyst. Small hypodense area within the pancreatic body measuring 9 mm likely reflecting a more focal area of pancreatitis or early pancreatic necrosis. Spleen: Within normal limits in size and appearance. Adrenals/Urinary Tract: No masses identified. No evidence of hydronephrosis. Stomach/Bowel: No evidence of obstruction, inflammatory process, or abnormal fluid collections. Vascular/Lymphatic: No pathologically enlarged lymph nodes. No evidence of abdominal aortic aneurysm. Reproductive: No mass or other significant abnormality. Other: None. Musculoskeletal:  No suspicious bone lesions identified. IMPRESSION: 1. Findings most consistent with a acute pancreatitis. No pancreatic fluid collection to suggest a pseudocyst or abscess. Small hypodense area within the pancreatic body measuring 9 mm likely reflecting a more focal area of pancreatitis. Electronically Signed   By: Elige KoHetal  Patel   On: 09/30/2015 22:49    EKG: Independently reviewed.  Assessment/Plan Principal Problem:   Acute pancreatitis Active Problems:   Essential hypertension, benign   Alcohol dependence (HCC)   Hypertriglyceridemia    Acute pancreatitis - likely secondary to hypertriglyceridemia  LDH pending  Does have LFT elevation  Levaquin and flagyl due to PCN allergy  IVF  Repeat lactate  HTN - holding metoprolol  Hypertriglyceridemia - likely will need long term treatment for this once acute pancreatitis resolves  Will discuss possible aphesis with hematology  Spoke with Dr. Bertis RuddyGorsuch - supportive care for the moment.  Not even sure that our phresis machine can do trygliceride removal.  Maybe call endocrine in  AM.  Ironically prior lipid panels had trig levels of 80 and 90 previously.     DVT prophylaxis: Lovenox Code Status: Full Family Communication: Family at bedside Consults called: Hematology Admission status: Admit to inpatient   Hillary BowGARDNER, Millissa Deese M. DO [facility] Pager [phone] from 7PM-7AM  If 7AM-7PM, please contact the day physician for the patient [email or web address] Password [email or web address]  10/01/2015, 12:40 AM

## 2015-10-01 NOTE — Progress Notes (Addendum)
PROGRESS NOTE    Aimee Thompson  ZOX:[id number] DOB: [birth year] DOA: 09/30/2015  PCP: Oliver Barre, MD   Brief Narrative:  45 y/o with asthma, HTN presenting with abdominal pain and vomiting. Found to have acute pancreatitis per labs and CT scan. Has triglyceride level > 5000. No h/o of elevated Triglycerides in the past. She has been having issues with abdominal discomfort for about 6-7 months now. Was being treated with a PPI and Bentyl.  Subjective: Abdominal pain is not severe. No further vomiting. No stools.   Assessment & Plan:   Principal Problem:   Acute pancreatitis/ hypertriglyceridemia   - has been having abdominal discomfort for many months not as noted in narrative above - EGD in 2/17- no signs of inflammation - on PPI and Bentyl at home per GI and PCP - Triglyceride level > 5000- last level in 2012 was 93 and in 2008 was 80 - have started insulin infusion with D10 - start Tricor 160 mg daily - if improvement does not occur, will need plasma exchange - NPO, pain control, IVF, follow I and O carefully - stop antibiotics  Active Problems:  Mildly elevated LFTs - likely secondary to above  Lactic acidosis/ dehydration - resolved    Essential hypertension, benign - Metoprolol on hold for now  Asthma - no flare at this time - albuterol, Symbicort  Depression/ anxiety - Lexapro and Xanax  ADD - Adderall     DVT prophylaxis: Lovenox Code Status: full code Family Communication: parents, sister Disposition Plan: follow in SDU Consultants:   PCCM Procedures:   none Antimicrobials:  Anti-infectives    Start     Dose/Rate Route Frequency Ordered Stop   10/01/15 2200  levofloxacin (LEVAQUIN) IVPB 750 mg  Status:  Discontinued     750 mg 100 mL/hr over 90 Minutes Intravenous Every 24 hours 09/30/15 2206 10/01/15 0915   10/01/15 0600  metroNIDAZOLE (FLAGYL) IVPB 500 mg  Status:  Discontinued     500 mg 100 mL/hr over 60 Minutes Intravenous Every 6  hours 09/30/15 2206 10/01/15 0915   09/30/15 2200  levofloxacin (LEVAQUIN) IVPB 750 mg     750 mg 100 mL/hr over 90 Minutes Intravenous  Once 09/30/15 2152 10/01/15 0018   09/30/15 2200  metroNIDAZOLE (FLAGYL) IVPB 500 mg     500 mg 100 mL/hr over 60 Minutes Intravenous  Once 09/30/15 2152 10/01/15 0127       Objective: Filed Vitals:   10/01/15 0533 10/01/15 0700 10/01/15 0802 10/01/15 1159  BP: 124/78     Pulse:  90 86   Temp:   98.7 F (37.1 C) 99.5 F (37.5 C)  TempSrc:   Oral Oral  Resp:  17 14   Height:      Weight:      SpO2:  96% 97%     Intake/Output Summary (Last 24 hours) at 10/01/15 1341 Last data filed at 10/01/15 1000  Gross per 24 hour  Intake 2558.33 ml  Output   1000 ml  Net 1558.33 ml   Filed Weights   09/30/15 1505  Weight: 64.864 kg (143 lb)    Examination: General exam: Appears comfortable  HEENT: PERRLA, oral mucosa moist, no sclera icterus or thrush Respiratory system: Clear to auscultation. Respiratory effort normal. Cardiovascular system: S1 & S2 heard, RRR.  No murmurs  Gastrointestinal system: Abdomen soft,  tender in epigastrium, nondistended. Decreased bowel sound. No organomegaly Central nervous system: Alert and oriented. No focal neurological deficits. Extremities: No cyanosis,  clubbing or edema Skin: No rashes or ulcers Psychiatry:  Mood & affect appropriate.     Data Reviewed: I have personally reviewed following labs and imaging studies  CBC:  Recent Labs Lab 09/30/15 2230  WBC 10.6*  NEUTROABS 8.7*  HGB 15.8*  HCT 44.4  MCV 94.9  PLT 241   Basic Metabolic Panel:  Recent Labs Lab 09/30/15 1705 10/01/15 0800  NA 133* 135  K 4.6 4.0  CL 100* 105  CO2 22 22  GLUCOSE 132* 102*  BUN 14 6  CREATININE 0.76 0.75  CALCIUM 8.8* 6.9*   GFR: Estimated Creatinine Clearance: 78.5 mL/min (by C-G formula based on Cr of 0.75). Liver Function Tests:  Recent Labs Lab 09/30/15 1705  AST 241*  ALT 182*  ALKPHOS 83    BILITOT 1.4*  PROT 6.5  ALBUMIN 3.6    Recent Labs Lab 09/30/15 1705 10/01/15 0800  LIPASE 655* 1188*   No results for input(s): AMMONIA in the last 168 hours. Coagulation Profile: No results for input(s): INR, PROTIME in the last 168 hours. Cardiac Enzymes: No results for input(s): CKTOTAL, CKMB, CKMBINDEX, TROPONINI in the last 168 hours. BNP (last 3 results) No results for input(s): PROBNP in the last 8760 hours. HbA1C: No results for input(s): HGBA1C in the last 72 hours. CBG:  Recent Labs Lab 10/01/15 0900 10/01/15 1022 10/01/15 1143 10/01/15 1208 10/01/15 1320  GLUCAP 111* 149* 173* 181* 159*   Lipid Profile:  Recent Labs  09/30/15 2129  TRIG >5000*   Thyroid Function Tests: No results for input(s): TSH, T4TOTAL, FREET4, T3FREE, THYROIDAB in the last 72 hours. Anemia Panel: No results for input(s): VITAMINB12, FOLATE, FERRITIN, TIBC, IRON, RETICCTPCT in the last 72 hours. Urine analysis:    Component Value Date/Time   COLORURINE AMBER* 09/30/2015 1823   APPEARANCEUR CLOUDY* 09/30/2015 1823   LABSPEC 1.028 09/30/2015 1823   PHURINE 7.0 09/30/2015 1823   GLUCOSEU NEGATIVE 09/30/2015 1823   GLUCOSEU NEGATIVE 03/26/2015 1704   HGBUR MODERATE* 09/30/2015 1823   BILIRUBINUR NEGATIVE 09/30/2015 1823   KETONESUR NEGATIVE 09/30/2015 1823   PROTEINUR NEGATIVE 09/30/2015 1823   UROBILINOGEN 0.2 03/26/2015 1704   NITRITE NEGATIVE 09/30/2015 1823   LEUKOCYTESUR SMALL* 09/30/2015 1823   Sepsis Labs: @LABRCNTIP (procalcitonin:4,lacticidven:4) ) Recent Results (from the past 240 hour(s))  MRSA PCR Screening     Status: None   Collection Time: 10/01/15 12:48 AM  Result Value Ref Range Status   MRSA by PCR NEGATIVE NEGATIVE Final    Comment:        The GeneXpert MRSA Assay (FDA approved for NASAL specimens only), is one component of a comprehensive MRSA colonization surveillance program. It is not intended to diagnose MRSA infection nor to guide  or monitor treatment for MRSA infections.          Radiology Studies: Ct Abdomen Pelvis W Contrast  09/30/2015  CLINICAL DATA:  Generalized abdominal pain radiating into the back. Nausea and vomiting for 1 day. EXAM: CT ABDOMEN AND PELVIS WITH CONTRAST TECHNIQUE: Multidetector CT imaging of the abdomen and pelvis was performed using the standard protocol following bolus administration of intravenous contrast. CONTRAST:  100mL ISOVUE-300 IOPAMIDOL (ISOVUE-300) INJECTION 61% COMPARISON:  07/06/2005 FINDINGS: Lower chest:  No acute findings. Hepatobiliary: No masses or other significant abnormality. Pancreas: Mild peripancreatic inflammatory changes no old fluid collection to suggest an abscess or pseudocyst. Small hypodense area within the pancreatic body measuring 9 mm likely reflecting a more focal area of pancreatitis or early pancreatic necrosis. Spleen:  Within normal limits in size and appearance. Adrenals/Urinary Tract: No masses identified. No evidence of hydronephrosis. Stomach/Bowel: No evidence of obstruction, inflammatory process, or abnormal fluid collections. Vascular/Lymphatic: No pathologically enlarged lymph nodes. No evidence of abdominal aortic aneurysm. Reproductive: No mass or other significant abnormality. Other: None. Musculoskeletal:  No suspicious bone lesions identified. IMPRESSION: 1. Findings most consistent with a acute pancreatitis. No pancreatic fluid collection to suggest a pseudocyst or abscess. Small hypodense area within the pancreatic body measuring 9 mm likely reflecting a more focal area of pancreatitis. Electronically Signed   By: Elige Ko   On: 09/30/2015 22:49      Scheduled Meds: . dicyclomine  10 mg Oral TID AC  . enoxaparin (LOVENOX) injection  40 mg Subcutaneous QHS  . escitalopram  10 mg Oral Daily  . mometasone-formoterol  2 puff Inhalation BID  . pantoprazole  40 mg Oral BID  . thiamine  100 mg Oral Daily   Continuous Infusions: . sodium  chloride 50 mL/hr at 10/01/15 0937  . dextrose 100 mL/hr at 10/01/15 1014  . insulin regular infusion 1 unit/mL 0.1 Units/kg/hr (10/01/15 1247)     LOS: 1 day    Time spent in minutes: 35    Carolos Fecher, MD [facility] Pager: [email or web address] Password [email or web address] 10/01/2015, 1:41 PM

## 2015-10-01 NOTE — Progress Notes (Signed)
Around 3-4pm, I noticed that d10 was at kvo instead of 100/hr. CBG at the time of discovery was 63. I corrected D10 rate back to 100 and checked CBG later and it was 73 and then 92. D50 not needed for hypoglycemia at this time.

## 2015-10-01 NOTE — Consult Note (Signed)
PULMONARY / CRITICAL CARE MEDICINE   Name: Aimee Thompson MRN: [id number] DOB: 03/01/1971    ADMISSION DATE:  09/30/2015 CONSULTATION DATE: 10/01/15  REFERRING MD: Calvert CantorSaima Rizwan MD  CHIEF COMPLAINT:  Abd pain, N/V  HISTORY OF PRESENT ILLNESS:   45 year old with past medical history of asthma, hypertension, familial hypertriglyceridemia. [facility] clinic with complaints of abdominal pain, nausea, vomiting. She was found to have pancreatitis with lipase of 655, mild pancreatic inflammation with no evidence of necrosis on CT scan. Triglycerides greater than 5000. PCCM consulted for help with management.  PAST MEDICAL HISTORY :  She  has a past medical history of ASTHMA; HEART MURMUR, HX OF; ADD (attention deficit disorder); Hypertension; and Hypertriglyceridemia, familial.  PAST SURGICAL HISTORY: She  has past surgical history that includes laproscopy; Shoulder surgery (Right, 2012); Cesarean section (2011); and Knee surgery (1990).  Allergies  Allergen Reactions  . Penicillins Hives    Has patient had a PCN reaction causing immediate rash, facial/tongue/throat swelling, SOB or lightheadedness with hypotension: no Has patient had a PCN reaction causing severe rash involving mucus membranes or skin necrosis: no Has patient had a PCN reaction that required hospitalization no Has patient had a PCN reaction occurring within the last 10 years: yes If all of the above answers are "NO", then may proceed with Cephalosporin use.  Caryl Never. Scallops [Shellfish Allergy] Itching and Other (See Comments)    Causes redness    No current facility-administered medications on file prior to encounter.   Current Outpatient Prescriptions on File Prior to Encounter  Medication Sig  . albuterol (PROVENTIL HFA;VENTOLIN HFA) 108 (90 Base) MCG/ACT inhaler Inhale 2 puffs into the lungs every 6 (six) hours as needed for wheezing or shortness of breath.  . ALPRAZolam  (XANAX) 0.25 MG tablet Take 1 tablet (0.25 mg total) by mouth 2 (two) times daily as needed for anxiety.  Marland Kitchen. amphetamine-dextroamphetamine (ADDERALL) 10 MG tablet Take 1 tablet (10 mg total) by mouth 2 (two) times daily.  . budesonide-formoterol (SYMBICORT) 160-4.5 MCG/ACT inhaler Inhale 2 puffs into the lungs 2 (two) times daily. For Shortness of breath (Patient taking differently: Inhale 2 puffs into the lungs 2 (two) times daily as needed (For shortness of breath.). )  . dicyclomine (BENTYL) 10 MG capsule Take 1 capsule (10 mg total) by mouth 3 (three) times daily before meals.  Marland Kitchen. escitalopram (LEXAPRO) 10 MG tablet Take 1 tablet (10 mg total) by mouth daily.  Marland Kitchen. ibuprofen (ADVIL,MOTRIN) 200 MG tablet Take 400 mg by mouth every 6 (six) hours as needed (For cramping.). Reported on 03/26/2015  . metoprolol succinate (TOPROL-XL) 50 MG 24 hr tablet TAKE ONE TABLET BY MOUTH EVERY MORNING. TAKE WITH OR IMMEDIATELY FOLLOWING A MEAL (Patient taking differently: TAKE 50 MG  BY MOUTH EVERY MORNING. TAKE WITH OR IMMEDIATELY FOLLOWING A MEAL)  . ondansetron (ZOFRAN) 4 MG tablet Take 1 tablet (4 mg total) by mouth 3 (three) times daily as needed for nausea or vomiting.  . pantoprazole (PROTONIX) 40 MG tablet Take 1 tablet (40 mg total) by mouth 2 (two) times daily.    FAMILY HISTORY:  Her has no family status information on file.   SOCIAL HISTORY: She  reports that she has never smoked. She has never used smokeless tobacco. She reports that she drinks alcohol. She reports that she does not use illicit drugs.  REVIEW OF SYSTEMS:   Denies any dyspnea, cough, sputum production, hemoptysis. No fevers, chills, malaise,  loss of weight, loss of appetite. Epigastric pain, tenderness, nausea, vomiting. No diarrhea, constipation. No chest pain, palpitation. All other review of systems are negative  SUBJECTIVE:  Feels better with improvement in abdominal pain.  VITAL SIGNS: BP 124/78 mmHg  Pulse 86  Temp(Src)  98.7 F (37.1 C) (Oral)  Resp 14  Ht 5\' 2"  (1.575 m)  Wt 143 lb (64.864 kg)  BMI 26.15 kg/m2  SpO2 97%  LMP 09/16/2015  HEMODYNAMICS:    VENTILATOR SETTINGS:    INTAKE / OUTPUT:    PHYSICAL EXAMINATION: General:  No distress. Neuro:  Awake, alert, oriented. Moves all 4 extremities, no focal deficits. HEENT:  Moist mucous membranes Cardiovascular:  Regular rate and rhythm, no murmurs rubs gallops. Lungs:  Clear, no wheeze, crackles Abdomen:  Epigastric tenderness, no guarding, rigidity. Positive bowel sounds. Musculoskeletal:  Normal tone and bulk, no edema Skin:  Intact no rash.  LABS:  BMET  Recent Labs Lab 09/30/15 1705  NA 133*  K 4.6  CL 100*  CO2 22  BUN 14  CREATININE 0.76  GLUCOSE 132*    Electrolytes  Recent Labs Lab 09/30/15 1705  CALCIUM 8.8*    CBC  Recent Labs Lab 09/30/15 2230  WBC 10.6*  HGB 15.8*  HCT 44.4  PLT 241    Coag's No results for input(s): APTT, INR in the last 168 hours.  Sepsis Markers  Recent Labs Lab 10/01/15 0103 10/01/15 0329 10/01/15 0800  LATICACIDVEN 3.3* 2.2* 1.7    ABG No results for input(s): PHART, PCO2ART, PO2ART in the last 168 hours.  Liver Enzymes  Recent Labs Lab 09/30/15 1705  AST 241*  ALT 182*  ALKPHOS 83  BILITOT 1.4*  ALBUMIN 3.6    Cardiac Enzymes No results for input(s): TROPONINI, PROBNP in the last 168 hours.  Glucose  Recent Labs Lab 10/01/15 0900  GLUCAP 111*    Imaging Ct Abdomen Pelvis W Contrast  09/30/2015  CLINICAL DATA:  Generalized abdominal pain radiating into the back. Nausea and vomiting for 1 day. EXAM: CT ABDOMEN AND PELVIS WITH CONTRAST TECHNIQUE: Multidetector CT imaging of the abdomen and pelvis was performed using the standard protocol following bolus administration of intravenous contrast. CONTRAST:  ISOVUE-300 IOPAMIDOL (ISOVUE-300) INJECTION 61% COMPARISON:  07/06/2005 FINDINGS: Lower chest:  No acute findings. Hepatobiliary: No masses  or other significant abnormality. Pancreas: Mild peripancreatic inflammatory changes no old fluid collection to suggest an abscess or pseudocyst. Small hypodense area within the pancreatic body measuring 9 mm likely reflecting a more focal area of pancreatitis or early pancreatic necrosis. Spleen: Within normal limits in size and appearance. Adrenals/Urinary Tract: No masses identified. No evidence of hydronephrosis. Stomach/Bowel: No evidence of obstruction, inflammatory process, or abnormal fluid collections. Vascular/Lymphatic: No pathologically enlarged lymph nodes. No evidence of abdominal aortic aneurysm. Reproductive: No mass or other significant abnormality. Other: None. Musculoskeletal:  No suspicious bone lesions identified. IMPRESSION: 1. Findings most consistent with a acute pancreatitis. No pancreatic fluid collection to suggest a pseudocyst or abscess. Small hypodense area within the pancreatic body measuring 9 mm likely reflecting a more focal area of pancreatitis. Electronically Signed   By: Elige Ko   On: 09/30/2015 22:49   CULTURES:   ANTIBIOTICS:   SIGNIFICANT EVENTS: 7/13 Admit with pancreatitis  LINES/TUBES:  DISCUSSION: 45 year old with familial hypertriglyceridemia. Admitted with pancreatitis, elevated triglycerides She appears stable with good hemodynamics, improvement in lactic acid.  Reccs: - Continue supportive care with IVF, pain management - Agree with insulin and dextrose to  lower the TG levels - Start anti lipid agent if she is able to tolerate PO - Can consider apheresis if there is no improvement in TG levels or clinical worsening.  Chilton Greathouse MD [facility] Pager [phone] If no answer or after 3pm call: [phone] 10/01/2015, 10:16 AM

## 2015-10-02 ENCOUNTER — Inpatient Hospital Stay (HOSPITAL_COMMUNITY): Payer: Medicaid Other

## 2015-10-02 DIAGNOSIS — F909 Attention-deficit hyperactivity disorder, unspecified type: Secondary | ICD-10-CM

## 2015-10-02 DIAGNOSIS — R140 Abdominal distension (gaseous): Secondary | ICD-10-CM

## 2015-10-02 LAB — GLUCOSE, CAPILLARY
GLUCOSE-CAPILLARY: 102 mg/dL — AB (ref 65–99)
GLUCOSE-CAPILLARY: 109 mg/dL — AB (ref 65–99)
GLUCOSE-CAPILLARY: 110 mg/dL — AB (ref 65–99)
GLUCOSE-CAPILLARY: 114 mg/dL — AB (ref 65–99)
GLUCOSE-CAPILLARY: 92 mg/dL (ref 65–99)
GLUCOSE-CAPILLARY: 94 mg/dL (ref 65–99)
GLUCOSE-CAPILLARY: 94 mg/dL (ref 65–99)
GLUCOSE-CAPILLARY: 96 mg/dL (ref 65–99)
GLUCOSE-CAPILLARY: 97 mg/dL (ref 65–99)
Glucose-Capillary: 79 mg/dL (ref 65–99)
Glucose-Capillary: 81 mg/dL (ref 65–99)
Glucose-Capillary: 90 mg/dL (ref 65–99)
Glucose-Capillary: 112 mg/dL — ABNORMAL HIGH (ref 65–99)
Glucose-Capillary: 94 mg/dL (ref 65–99)
Glucose-Capillary: 89 mg/dL (ref 65–99)
Glucose-Capillary: 130 mg/dL — ABNORMAL HIGH (ref 65–99)
Glucose-Capillary: 84 mg/dL (ref 65–99)
Glucose-Capillary: 80 mg/dL (ref 65–99)

## 2015-10-02 LAB — TRIGLYCERIDES
TRIGLYCERIDES: 211 mg/dL — AB (ref <150)
Triglycerides: 2689 mg/dL — ABNORMAL HIGH (ref <150)

## 2015-10-02 LAB — BASIC METABOLIC PANEL
Anion gap: 6 (ref 5–15)
Anion gap: 7 (ref 5–15)
BUN: <5 mg/dL — ABNORMAL LOW (ref 6–20)
BUN: <5 mg/dL — ABNORMAL LOW (ref 6–20)
CALCIUM: 6.2 mg/dL — AB (ref 8.9–10.3)
CHLORIDE: 97 mmol/L — AB (ref 101–111)
CHLORIDE: 94 mmol/L — AB (ref 101–111)
CO2: 25 mmol/L (ref 22–32)
CO2: 30 mmol/L (ref 22–32)
CREATININE: 0.33 mg/dL — AB (ref 0.44–1.00)
CREATININE: 0.59 mg/dL (ref 0.44–1.00)
Calcium: 6.5 mg/dL — ABNORMAL LOW (ref 8.9–10.3)
GFR calc non Af Amer: >60 mL/min (ref >60)
GFR calc non Af Amer: >60 mL/min (ref >60)
Glucose, Bld: 77 mg/dL (ref 65–99)
Glucose, Bld: 76 mg/dL (ref 65–99)
POTASSIUM: 2.7 mmol/L — AB (ref 3.5–5.1)
Potassium: 3.2 mmol/L — ABNORMAL LOW (ref 3.5–5.1)
SODIUM: 128 mmol/L — AB (ref 135–145)
SODIUM: 131 mmol/L — AB (ref 135–145)

## 2015-10-02 LAB — CBC
HEMATOCRIT: 35.1 % — AB (ref 36.0–46.0)
Hemoglobin: 12.1 g/dL (ref 12.0–15.0)
MCH: 33.4 pg (ref 26.0–34.0)
MCHC: 34.5 g/dL (ref 30.0–36.0)
MCV: 97 fL (ref 78.0–100.0)
Platelets: 147 10*3/uL — ABNORMAL LOW (ref 150–400)
RBC: 3.62 MIL/uL — ABNORMAL LOW (ref 3.87–5.11)
RDW: 12.9 % (ref 11.5–15.5)
WBC: 5.7 10*3/uL (ref 4.0–10.5)

## 2015-10-02 LAB — T4, FREE: Free T4: 0.86 ng/dL (ref 0.61–1.12)

## 2015-10-02 LAB — LIPASE, BLOOD: Lipase: 400 U/L — ABNORMAL HIGH (ref 11–51)

## 2015-10-02 LAB — TSH: TSH: 0.923 u[IU]/mL (ref 0.350–4.500)

## 2015-10-02 MED ORDER — ACETAMINOPHEN 650 MG RE SUPP
650.0000 mg | Freq: Four times a day (QID) | RECTAL | Status: DC | PRN
  Administered 2015-10-02: 650 mg via RECTAL
  Filled 2015-10-02: qty 1

## 2015-10-02 MED ORDER — FUROSEMIDE 10 MG/ML IJ SOLN
20.0000 mg | Freq: Once | INTRAMUSCULAR | Status: AC
  Administered 2015-10-02: 20 mg via INTRAVENOUS
  Filled 2015-10-02: qty 2

## 2015-10-02 MED ORDER — VANCOMYCIN HCL IN DEXTROSE 750-5 MG/150ML-% IV SOLN
750.0000 mg | Freq: Two times a day (BID) | INTRAVENOUS | Status: DC
  Administered 2015-10-02 – 2015-10-04 (×4): 750 mg via INTRAVENOUS
  Filled 2015-10-02 (×5): qty 150

## 2015-10-02 MED ORDER — POTASSIUM CHLORIDE 10 MEQ/100ML IV SOLN
10.0000 meq | INTRAVENOUS | Status: AC
  Administered 2015-10-02 (×2): 10 meq via INTRAVENOUS
  Filled 2015-10-02: qty 100

## 2015-10-02 MED ORDER — SODIUM CHLORIDE 0.9 % IV SOLN
500.0000 mg | Freq: Four times a day (QID) | INTRAVENOUS | Status: DC
  Administered 2015-10-02 – 2015-10-08 (×23): 500 mg via INTRAVENOUS
  Filled 2015-10-02 (×24): qty 500

## 2015-10-02 MED ORDER — POTASSIUM CHLORIDE 10 MEQ/100ML IV SOLN
10.0000 meq | INTRAVENOUS | Status: AC
  Administered 2015-10-02 – 2015-10-03 (×6): 10 meq via INTRAVENOUS
  Filled 2015-10-02: qty 100

## 2015-10-02 NOTE — Progress Notes (Signed)
10/02/15  1822  Patient refused to wear slip resistant socks and wants to walk bare foot to bathroom. Educated patient and partner that we use the slip resistant socks to help prevent patients from falling. Pt still refused.

## 2015-10-02 NOTE — Progress Notes (Signed)
CRITICAL VALUE ALERT  Critical value received:  Calcium   Date of notification:  10/02/15  Time of notification:  0600  Critical value read back yes    Nurse who received alert:  Ebbie LatusBarbara Pascale Maves RN   MD notified (1st page):  Donnamarie PoagK. Kirby NP   Time of first page:  0615  MD notified (2nd page):  Time of second page:  Responding MD:  New orders.   Time MD responded:

## 2015-10-02 NOTE — Progress Notes (Signed)
CRITICAL VALUE ALERT  Critical value received: Potassium 2.7  Date of notification:  10/02/15  Time of notification:  1745  Critical value read back: Yes  Nurse who received alert:  Addison NaegelieKina, RN  MD notified (1st page):  Rizwan  Time of first page:  1840 verbal  MD notified (2nd page):  Time of second page:  Responding MD:  Butler Denmarkizwan  Time MD responded:  1840

## 2015-10-02 NOTE — Progress Notes (Signed)
Pharmacy Antibiotic Note  Aimee Thompson is a 45 y.o. female admitted on 09/30/2015 with acute pancreatitis, hypertriglyceridemia.  Patient now spiking fever.  Pharmacy has been consulted for vancomycin and Primaxin dosing for sepsis.  Patient has a penicillin allergy, no history of penicillin or cephalosporin use per chart review.  Plan: Vancomycin 750 mg IV q12h. Primaxin 500 mg IV q6h.  Height: 5\' 2"  (157.5 cm) Weight: 143 lb (64.864 kg) IBW/kg (Calculated) : 50.1  Temp (24hrs), Avg:100.2 F (37.9 C), Min:98.4 F (36.9 C), Max:103 F (39.4 C)   Recent Labs Lab 09/30/15 1705 09/30/15 2145 09/30/15 2230 10/01/15 0103 10/01/15 0329 10/01/15 0800 10/01/15 1819 10/02/15 0315 10/02/15 1655  WBC  --   --  10.6*  --   --   --   --   --   --   CREATININE 0.76  --   --   --   --  0.75 0.75 0.33* 0.59  LATICACIDVEN  --  4.31*  --  3.3* 2.2* 1.7  --   --   --     Estimated Creatinine Clearance: 78.5 mL/min (by C-G formula based on Cr of 0.59).    Allergies  Allergen Reactions  . Penicillins Hives    Has patient had a PCN reaction causing immediate rash, facial/tongue/throat swelling, SOB or lightheadedness with hypotension: no Has patient had a PCN reaction causing severe rash involving mucus membranes or skin necrosis: no Has patient had a PCN reaction that required hospitalization no Has patient had a PCN reaction occurring within the last 10 years: yes If all of the above answers are "NO", then may proceed with Cephalosporin use.  Caryl Never. Scallops [Shellfish Allergy] Itching and Other (See Comments)    Causes redness    Antimicrobials this admission: 7/14 Vancomycin >>  7/14 Primaxin >>   Dose adjustments this admission: -  Microbiology results: 7/12 BCx: ngtd 7/13 MRSA PCR: neg 7/14 BCx: ordered  Thank you for allowing pharmacy to be a part of this patient's care.  Clance BollRunyon, Ercell Razon 10/02/2015 7:19 PM

## 2015-10-02 NOTE — Consult Note (Signed)
PULMONARY / CRITICAL CARE MEDICINE   Name: Aimee Thompson MRN: [id number] DOB: 03/01/1971    ADMISSION DATE:  09/30/2015 CONSULTATION DATE: 10/01/15  REFERRING MD: Calvert CantorSaima Rizwan MD  CHIEF COMPLAINT:  Abd pain, N/V  HISTORY OF PRESENT ILLNESS:   45 year old with past medical history of asthma, hypertension, familial hypertriglyceridemia. [facility] clinic with complaints of abdominal pain, nausea, vomiting. She was found to have pancreatitis with lipase of 655, mild pancreatic inflammation with no evidence of necrosis on CT scan. Triglycerides greater than 5000. PCCM consulted for help with management.  PAST MEDICAL HISTORY :  She  has a past medical history of ASTHMA; HEART MURMUR, HX OF; ADD (attention deficit disorder); Hypertension; and Hypertriglyceridemia, familial.  PAST SURGICAL HISTORY: She  has past surgical history that includes laproscopy; Shoulder surgery (Right, 2012); Cesarean section (2011); and Knee surgery (1990).  Allergies  Allergen Reactions  . Penicillins Hives    Has patient had a PCN reaction causing immediate rash, facial/tongue/throat swelling, SOB or lightheadedness with hypotension: no Has patient had a PCN reaction causing severe rash involving mucus membranes or skin necrosis: no Has patient had a PCN reaction that required hospitalization no Has patient had a PCN reaction occurring within the last 10 years: yes If all of the above answers are "NO", then may proceed with Cephalosporin use.  Caryl Never. Scallops [Shellfish Allergy] Itching and Other (See Comments)    Causes redness    No current facility-administered medications on file prior to encounter.   Current Outpatient Prescriptions on File Prior to Encounter  Medication Sig  . albuterol (PROVENTIL HFA;VENTOLIN HFA) 108 (90 Base) MCG/ACT inhaler Inhale 2 puffs into the lungs every 6 (six) hours as needed for wheezing or shortness of breath.  . ALPRAZolam  (XANAX) 0.25 MG tablet Take 1 tablet (0.25 mg total) by mouth 2 (two) times daily as needed for anxiety.  Marland Kitchen. amphetamine-dextroamphetamine (ADDERALL) 10 MG tablet Take 1 tablet (10 mg total) by mouth 2 (two) times daily.  . budesonide-formoterol (SYMBICORT) 160-4.5 MCG/ACT inhaler Inhale 2 puffs into the lungs 2 (two) times daily. For Shortness of breath (Patient taking differently: Inhale 2 puffs into the lungs 2 (two) times daily as needed (For shortness of breath.). )  . dicyclomine (BENTYL) 10 MG capsule Take 1 capsule (10 mg total) by mouth 3 (three) times daily before meals.  Marland Kitchen. escitalopram (LEXAPRO) 10 MG tablet Take 1 tablet (10 mg total) by mouth daily.  Marland Kitchen. ibuprofen (ADVIL,MOTRIN) 200 MG tablet Take 400 mg by mouth every 6 (six) hours as needed (For cramping.). Reported on 03/26/2015  . metoprolol succinate (TOPROL-XL) 50 MG 24 hr tablet TAKE ONE TABLET BY MOUTH EVERY MORNING. TAKE WITH OR IMMEDIATELY FOLLOWING A MEAL (Patient taking differently: TAKE 50 MG  BY MOUTH EVERY MORNING. TAKE WITH OR IMMEDIATELY FOLLOWING A MEAL)  . ondansetron (ZOFRAN) 4 MG tablet Take 1 tablet (4 mg total) by mouth 3 (three) times daily as needed for nausea or vomiting.  . pantoprazole (PROTONIX) 40 MG tablet Take 1 tablet (40 mg total) by mouth 2 (two) times daily.    FAMILY HISTORY:  Her has no family status information on file.   SOCIAL HISTORY: She  reports that she has never smoked. She has never used smokeless tobacco. She reports that she drinks alcohol. She reports that she does not use illicit drugs.  REVIEW OF SYSTEMS:   Denies any dyspnea, cough, sputum production, hemoptysis. No fevers, chills, malaise,  loss of weight, loss of appetite. Epigastric pain, tenderness, nausea, vomiting. No diarrhea, constipation. No chest pain, palpitation. All other review of systems are negative  SUBJECTIVE:  Feels better but still has abdominal pain. Not able to keep down PO meds  VITAL SIGNS: BP 96/57 mmHg   Pulse 95  Temp(Src) 100.3 F (37.9 C) (Oral)  Resp 14  Ht 5\' 2"  (1.575 m)  Wt 143 lb (64.864 kg)  BMI 26.15 kg/m2  SpO2 98%  LMP 09/16/2015  HEMODYNAMICS:    VENTILATOR SETTINGS:    INTAKE / OUTPUT: I/O last 3 completed shifts: In: 6643.7 [P.O.:30; I.V.:4913.7; IV Piggyback:1700] Out: 4050 [Urine:4050]  PHYSICAL EXAMINATION: General:  No distress. Neuro:  Awake, alert, oriented. Moves all 4 extremities, no focal deficits. HEENT:  Moist mucous membranes Cardiovascular:  Regular rate and rhythm, no murmurs rubs gallops. Lungs:  Clear, no wheeze, crackles Abdomen:  Epigastric tenderness, no guarding, rigidity. Reduced bowel sounds. Musculoskeletal:  Normal tone and bulk, no edema Skin:  Intact no rash.  LABS:  BMET  Recent Labs Lab 10/01/15 0800 10/01/15 1819 10/02/15 0315  NA 135 128* 128*  K 4.0 3.7 3.2*  CL 105 97* 97*  CO2 22 22 25   BUN 6 <5* <5*  CREATININE 0.75 0.75 0.33*  GLUCOSE 102* 75 77    Electrolytes  Recent Labs Lab 10/01/15 0800 10/01/15 1819 10/02/15 0315  CALCIUM 6.9* 6.5* 6.2*    CBC  Recent Labs Lab 09/30/15 2230  WBC 10.6*  HGB 15.8*  HCT 44.4  PLT 241    Coag's No results for input(s): APTT, INR in the last 168 hours.  Sepsis Markers  Recent Labs Lab 10/01/15 0103 10/01/15 0329 10/01/15 0800  LATICACIDVEN 3.3* 2.2* 1.7    ABG No results for input(s): PHART, PCO2ART, PO2ART in the last 168 hours.  Liver Enzymes  Recent Labs Lab 09/30/15 1705  AST 241*  ALT 182*  ALKPHOS 83  BILITOT 1.4*  ALBUMIN 3.6    Cardiac Enzymes No results for input(s): TROPONINI, PROBNP in the last 168 hours.  Glucose  Recent Labs Lab 10/02/15 0109 10/02/15 0300 10/02/15 0501 10/02/15 0612 10/02/15 0717 10/02/15 0817  GLUCAP 94 79 114* 92 102* 81    Imaging No results found. CULTURES:   ANTIBIOTICS:   SIGNIFICANT EVENTS: 7/13 Admit with pancreatitis  LINES/TUBES:  DISCUSSION: 45 year old with  familial hypertriglyceridemia. Admitted with pancreatitis, elevated triglycerides She appears stable with good hemodynamics, improvement in lactic acid, triglycerides and lipase  Reccs: - Continue supportive care with IVF, pain management - Continue insulin and dextrose to lower the TG levels - She may need a post pyloric tube if her nausea and emesis do not improve.  PCCM will be available as needed. Please call with any questions.   Chilton GreathousePraveen Kamarrion Stfort MD [facility] Pager [phone] If no answer or after 3pm call: [phone] 10/02/2015, 11:06 AM

## 2015-10-02 NOTE — Progress Notes (Signed)
PROGRESS NOTE    Phillip Heallizabeth W Pell  ZOX:[id number] DOB: 08/03/1970 DOA: 09/30/2015  PCP: Oliver BarreJames John, MD   Brief Narrative:  45 y/o with asthma, HTN presenting with abdominal pain and vomiting. Found to have acute pancreatitis per labs and CT scan. Has triglyceride level > 5000. No h/o of elevated Triglycerides in the past. She has been having issues with abdominal discomfort for about 6-7 months now. Was being treated with a PPI and Bentyl.  Subjective: Abdominal discomfort is like and "ache" today through out her abdomen. Burping frequently. Not passing gas. Abdomen is distended more than yesterday. No vomiting. No stools.   Assessment & Plan:   Principal Problem:   Acute pancreatitis/ hypertriglyceridemia   - has been having abdominal discomfort for many months as noted in narrative above - EGD in 2/17- no signs of inflammation - on PPI and Bentyl at home per GI and PCP - Triglyceride level > 5000- last level in 2012 was 93 and in 2008 was 80 - have started insulin infusion with D10 - start Tricor 160 mg daily - both lipids and lipase improving on current treatment- appears to have an ileus on exam- cont NPO - cont pain control, IVF, follow I and O carefully - stopped antibiotics- fever 102 last night likely due to inflammation rather then infection- hold off on CT scan today  Active Problems:  Ileus - clinically appears to have an ileus- obtain abd plain film- cont NPO  Hyponatremia - unfortunately is due D10- started Lasix to expel free water- following I and O carefully   Mildly elevated LFTs - likely secondary to above  Lactic acidosis/ dehydration - resolved    Essential hypertension, benign - Metoprolol on hold for now  Asthma - no flare at this time - albuterol, Symbicort  Depression/ anxiety - Lexapro and Xanax  ADD - Adderall     DVT prophylaxis: Lovenox Code Status: full code Family Communication: parents, sister Disposition Plan: follow in  SDU Consultants:   PCCM Procedures:   none Antimicrobials:  Anti-infectives    Start     Dose/Rate Route Frequency Ordered Stop   10/01/15 2200  levofloxacin (LEVAQUIN) IVPB 750 mg  Status:  Discontinued     750 mg 100 mL/hr over 90 Minutes Intravenous Every 24 hours 09/30/15 2206 10/01/15 0915   10/01/15 0600  metroNIDAZOLE (FLAGYL) IVPB 500 mg  Status:  Discontinued     500 mg 100 mL/hr over 60 Minutes Intravenous Every 6 hours 09/30/15 2206 10/01/15 0915   09/30/15 2200  levofloxacin (LEVAQUIN) IVPB 750 mg     750 mg 100 mL/hr over 90 Minutes Intravenous  Once 09/30/15 2152 10/01/15 0018   09/30/15 2200  metroNIDAZOLE (FLAGYL) IVPB 500 mg     500 mg 100 mL/hr over 60 Minutes Intravenous  Once 09/30/15 2152 10/01/15 0127       Objective: Filed Vitals:   10/02/15 0305 10/02/15 0315 10/02/15 0719 10/02/15 0800  BP:   96/57   Pulse:  89 95   Temp: 99 F (37.2 C)   100.3 F (37.9 C)  TempSrc: Oral   Oral  Resp:  12 14   Height:      Weight:      SpO2:  94% 98%     Intake/Output Summary (Last 24 hours) at 10/02/15 1150 Last data filed at 10/02/15 1037  Gross per 24 hour  Intake 4085.38 ml  Output   4800 ml  Net -714.62 ml   [employer]ights  09/30/15 1505  Weight: 64.864 kg (143 lb)    Examination: General exam: Appears comfortable  HEENT: PERRLA, oral mucosa moist, no sclera icterus or thrush Respiratory system: Clear to auscultation. Respiratory effort normal. Cardiovascular system: S1 & S2 heard, RRR.  No murmurs  Gastrointestinal system: Abdomen soft,  tender in epigastrium,  Distended today, Decreased bowel sounds. No organomegaly Central nervous system: Alert and oriented. No focal neurological deficits. Extremities: No cyanosis, clubbing or edema Skin: No rashes or ulcers Psychiatry:  Mood & affect appropriate.     Data Reviewed: I have personally reviewed following labs and imaging studies  CBC:  Recent Labs Lab 09/30/15 2230  WBC 10.6*   NEUTROABS 8.7*  HGB 15.8*  HCT 44.4  MCV 94.9  PLT 241   Basic Metabolic Panel:  Recent Labs Lab 09/30/15 1705 10/01/15 0800 10/01/15 1819 10/02/15 0315  NA 133* 135 128* 128*  K 4.6 4.0 3.7 3.2*  CL 100* 105 97* 97*  CO2 22 22 22 25   GLUCOSE 132* 102* 75 77  BUN 14 6 <5* <5*  CREATININE 0.76 0.75 0.75 0.33*  CALCIUM 8.8* 6.9* 6.5* 6.2*   GFR: Estimated Creatinine Clearance: 78.5 mL/min (by C-G formula based on Cr of 0.33). Liver Function Tests:  Recent Labs Lab 09/30/15 1705  AST 241*  ALT 182*  ALKPHOS 83  BILITOT 1.4*  PROT 6.5  ALBUMIN 3.6    Recent Labs Lab 09/30/15 1705 10/01/15 0800 10/02/15 0315  LIPASE 655* 1188* 400*   No results for input(s): AMMONIA in the last 168 hours. Coagulation Profile: No results for input(s): INR, PROTIME in the last 168 hours. Cardiac Enzymes: No results for input(s): CKTOTAL, CKMB, CKMBINDEX, TROPONINI in the last 168 hours. BNP (last 3 results) No results for input(s): PROBNP in the last 8760 hours. HbA1C: No results for input(s): HGBA1C in the last 72 hours. CBG:  Recent Labs Lab 10/02/15 0300 10/02/15 0501 10/02/15 0612 10/02/15 0717 10/02/15 0817  GLUCAP 79 114* 92 102* 81   Lipid Profile:  Recent Labs  09/30/15 2129 10/01/15 2239  TRIG >5000* 2689*   Thyroid Function Tests: No results for input(s): TSH, T4TOTAL, FREET4, T3FREE, THYROIDAB in the last 72 hours. Anemia Panel: No results for input(s): VITAMINB12, FOLATE, FERRITIN, TIBC, IRON, RETICCTPCT in the last 72 hours. Urine analysis:    Component Value Date/Time   COLORURINE AMBER* 09/30/2015 1823   APPEARANCEUR CLOUDY* 09/30/2015 1823   LABSPEC 1.028 09/30/2015 1823   PHURINE 7.0 09/30/2015 1823   GLUCOSEU NEGATIVE 09/30/2015 1823   GLUCOSEU NEGATIVE 03/26/2015 1704   HGBUR MODERATE* 09/30/2015 1823   BILIRUBINUR NEGATIVE 09/30/2015 1823   KETONESUR NEGATIVE 09/30/2015 1823   PROTEINUR NEGATIVE 09/30/2015 1823   UROBILINOGEN 0.2  03/26/2015 1704   NITRITE NEGATIVE 09/30/2015 1823   LEUKOCYTESUR SMALL* 09/30/2015 1823   Sepsis Labs: @LABRCNTIP (procalcitonin:4,lacticidven:4) ) Recent Results (from the past 240 hour(s))  Blood culture (routine x 2)     Status: None (Preliminary result)   Collection Time: 09/30/15  9:29 PM  Result Value Ref Range Status   Specimen Description RIGHT ANTECUBITAL  Final   Special Requests BOTTLES DRAWN AEROBIC AND ANAEROBIC 5CC  Final   Culture   Final    NO GROWTH < 24 HOURS Performed at [facility]    Report Status PENDING  Incomplete  Blood culture (routine x 2)     Status: None (Preliminary result)   Collection Time: 09/30/15 10:30 PM  Result Value Ref Range Status   Specimen  Description BLOOD LEFT ANTECUBITAL  Final   Special Requests BOTTLES DRAWN AEROBIC AND ANAEROBIC 5CC EACH  Final   Culture   Final    NO GROWTH < 24 HOURS Performed at [facility]    Report Status PENDING  Incomplete  MRSA PCR Screening     Status: None   Collection Time: 10/01/15 12:48 AM  Result Value Ref Range Status   MRSA by PCR NEGATIVE NEGATIVE Final    Comment:        The GeneXpert MRSA Assay (FDA approved for NASAL specimens only), is one component of a comprehensive MRSA colonization surveillance program. It is not intended to diagnose MRSA infection nor to guide or monitor treatment for MRSA infections.          Radiology Studies: Ct Abdomen Pelvis W Contrast  09/30/2015  CLINICAL DATA:  Generalized abdominal pain radiating into the back. Nausea and vomiting for 1 day. EXAM: CT ABDOMEN AND PELVIS WITH CONTRAST TECHNIQUE: Multidetector CT imaging of the abdomen and pelvis was performed using the standard protocol following bolus administration of intravenous contrast. CONTRAST:  ISOVUE-300 IOPAMIDOL (ISOVUE-300) INJECTION 61% COMPARISON:  07/06/2005 FINDINGS: Lower chest:  No acute findings. Hepatobiliary: No masses or other significant abnormality.  Pancreas: Mild peripancreatic inflammatory changes no old fluid collection to suggest an abscess or pseudocyst. Small hypodense area within the pancreatic body measuring 9 mm likely reflecting a more focal area of pancreatitis or early pancreatic necrosis. Spleen: Within normal limits in size and appearance. Adrenals/Urinary Tract: No masses identified. No evidence of hydronephrosis. Stomach/Bowel: No evidence of obstruction, inflammatory process, or abnormal fluid collections. Vascular/Lymphatic: No pathologically enlarged lymph nodes. No evidence of abdominal aortic aneurysm. Reproductive: No mass or other significant abnormality. Other: None. Musculoskeletal:  No suspicious bone lesions identified. IMPRESSION: 1. Findings most consistent with a acute pancreatitis. No pancreatic fluid collection to suggest a pseudocyst or abscess. Small hypodense area within the pancreatic body measuring 9 mm likely reflecting a more focal area of pancreatitis. Electronically Signed   By: Elige Ko   On: 09/30/2015 22:49      Scheduled Meds: . dicyclomine  10 mg Oral TID AC  . enoxaparin (LOVENOX) injection  40 mg Subcutaneous QHS  . escitalopram  10 mg Oral Daily  . fenofibrate  160 mg Oral Daily  . furosemide  20 mg Intravenous Once  . mometasone-formoterol  2 puff Inhalation BID  . pantoprazole  40 mg Oral BID  . thiamine  100 mg Oral Daily   Continuous Infusions: . sodium chloride 50 mL/hr at 10/01/15 2023  . dextrose 150 mL/hr at 10/02/15 0010  . insulin regular infusion 1 unit/mL 0.1 Units/kg/hr (10/01/15 1900)     LOS: 2 days    Time spent in minutes: 35    Damaria Stofko, MD [facility] Pager: [email or web address] Password [email or web address] 10/02/2015, 11:50 AM

## 2015-10-03 ENCOUNTER — Inpatient Hospital Stay (HOSPITAL_COMMUNITY): Payer: Medicaid Other

## 2015-10-03 ENCOUNTER — Encounter (HOSPITAL_COMMUNITY): Payer: Self-pay | Admitting: Radiology

## 2015-10-03 DIAGNOSIS — I9589 Other hypotension: Secondary | ICD-10-CM

## 2015-10-03 LAB — COMPREHENSIVE METABOLIC PANEL
ALBUMIN: 2.7 g/dL — AB (ref 3.5–5.0)
ALT: 45 U/L (ref 14–54)
ANION GAP: 6 (ref 5–15)
AST: 33 U/L (ref 15–41)
Alkaline Phosphatase: 59 U/L (ref 38–126)
BILIRUBIN TOTAL: 1.5 mg/dL — AB (ref 0.3–1.2)
BUN: <5 mg/dL — ABNORMAL LOW (ref 6–20)
CHLORIDE: 102 mmol/L (ref 101–111)
CO2: 28 mmol/L (ref 22–32)
Calcium: 7.8 mg/dL — ABNORMAL LOW (ref 8.9–10.3)
Creatinine, Ser: 0.56 mg/dL (ref 0.44–1.00)
GFR calc Af Amer: >60 mL/min (ref >60)
GFR calc non Af Amer: >60 mL/min (ref >60)
GLUCOSE: 125 mg/dL — AB (ref 65–99)
Potassium: 3.6 mmol/L (ref 3.5–5.1)
SODIUM: 136 mmol/L (ref 135–145)
Total Protein: 5.7 g/dL — ABNORMAL LOW (ref 6.5–8.1)

## 2015-10-03 LAB — BASIC METABOLIC PANEL
ANION GAP: 3 — AB (ref 5–15)
BUN: <5 mg/dL — ABNORMAL LOW (ref 6–20)
CALCIUM: 6.8 mg/dL — AB (ref 8.9–10.3)
CO2: 31 mmol/L (ref 22–32)
Chloride: 103 mmol/L (ref 101–111)
Creatinine, Ser: 0.5 mg/dL (ref 0.44–1.00)
GFR calc Af Amer: >60 mL/min (ref >60)
GFR calc non Af Amer: >60 mL/min (ref >60)
GLUCOSE: 45 mg/dL — AB (ref 65–99)
Potassium: 3 mmol/L — ABNORMAL LOW (ref 3.5–5.1)
Sodium: 137 mmol/L (ref 135–145)

## 2015-10-03 LAB — GLUCOSE, CAPILLARY
GLUCOSE-CAPILLARY: 100 mg/dL — AB (ref 65–99)
GLUCOSE-CAPILLARY: 50 mg/dL — AB (ref 65–99)
GLUCOSE-CAPILLARY: 57 mg/dL — AB (ref 65–99)
GLUCOSE-CAPILLARY: 110 mg/dL — AB (ref 65–99)
GLUCOSE-CAPILLARY: 67 mg/dL (ref 65–99)
GLUCOSE-CAPILLARY: 194 mg/dL — AB (ref 65–99)
GLUCOSE-CAPILLARY: 127 mg/dL — AB (ref 65–99)
GLUCOSE-CAPILLARY: 119 mg/dL — AB (ref 65–99)
Glucose-Capillary: 57 mg/dL — ABNORMAL LOW (ref 65–99)
Glucose-Capillary: 74 mg/dL (ref 65–99)
Glucose-Capillary: 78 mg/dL (ref 65–99)

## 2015-10-03 LAB — CBC
HCT: 29.6 % — ABNORMAL LOW (ref 36.0–46.0)
Hemoglobin: 10.6 g/dL — ABNORMAL LOW (ref 12.0–15.0)
MCH: 33.8 pg (ref 26.0–34.0)
MCHC: 35.8 g/dL (ref 30.0–36.0)
MCV: 94.3 fL (ref 78.0–100.0)
PLATELETS: 126 10*3/uL — AB (ref 150–400)
RBC: 3.14 MIL/uL — AB (ref 3.87–5.11)
RDW: 13 % (ref 11.5–15.5)
WBC: 4.3 10*3/uL (ref 4.0–10.5)

## 2015-10-03 LAB — LIPASE, BLOOD: Lipase: 187 U/L — ABNORMAL HIGH (ref 11–51)

## 2015-10-03 LAB — TRIGLYCERIDES
Triglycerides: 119 mg/dL (ref <150)
Triglycerides: 91 mg/dL (ref <150)

## 2015-10-03 LAB — LACTIC ACID, PLASMA: LACTIC ACID, VENOUS: 1.8 mmol/L (ref 0.5–1.9)

## 2015-10-03 LAB — PROCALCITONIN: Procalcitonin: 0.2 ng/mL

## 2015-10-03 MED ORDER — DEXTROSE 50 % IV SOLN
INTRAVENOUS | Status: AC
  Filled 2015-10-03: qty 50

## 2015-10-03 MED ORDER — POTASSIUM CHLORIDE 10 MEQ/100ML IV SOLN
10.0000 meq | INTRAVENOUS | Status: AC
  Administered 2015-10-03 (×4): 10 meq via INTRAVENOUS
  Filled 2015-10-03 (×4): qty 100

## 2015-10-03 MED ORDER — SODIUM CHLORIDE 0.9 % IV SOLN
1.0000 g | Freq: Once | INTRAVENOUS | Status: AC
  Administered 2015-10-03: 1 g via INTRAVENOUS
  Filled 2015-10-03: qty 10

## 2015-10-03 MED ORDER — ONDANSETRON HCL 4 MG/2ML IJ SOLN
4.0000 mg | Freq: Four times a day (QID) | INTRAMUSCULAR | Status: DC
  Administered 2015-10-03 – 2015-10-08 (×20): 4 mg via INTRAVENOUS
  Filled 2015-10-03 (×20): qty 2

## 2015-10-03 MED ORDER — IOPAMIDOL (ISOVUE-300) INJECTION 61%
100.0000 mL | Freq: Once | INTRAVENOUS | Status: AC | PRN
  Administered 2015-10-03: 100 mL via INTRAVENOUS

## 2015-10-03 MED ORDER — HYDROMORPHONE HCL 1 MG/ML IJ SOLN
0.5000 mg | INTRAMUSCULAR | Status: DC | PRN
  Administered 2015-10-03 – 2015-10-07 (×25): 1 mg via INTRAVENOUS
  Filled 2015-10-03 (×26): qty 1

## 2015-10-03 MED ORDER — FAMOTIDINE IN NACL 20-0.9 MG/50ML-% IV SOLN
20.0000 mg | Freq: Two times a day (BID) | INTRAVENOUS | Status: DC | PRN
  Filled 2015-10-03: qty 50

## 2015-10-03 NOTE — Progress Notes (Signed)
Pt's blood sugar has trending down in the 50's throughout the night. At 0208 CBG was 50 and her recheck was 100, at 0408 CBG was 57 and her recheck was 78, 0610 57 with the recheck at 110, Each time and amp of D25 was given. I paged Elray McgregorMary Lynch NP and the order was given to decrease her Insulin drip to 5.8. Will continue to monitor.

## 2015-10-03 NOTE — Progress Notes (Signed)
Pt 's CBG has been trending in the 50's throughout the night. At 0208 the CBG was 50 and the recheck was 100, 0408 the CBG was 57 and the recheck was 78. Then at 0610 the CBG was 57 and the recheck was 110. I paged Elray McgregorMary Lynch NP and a order was given, to decrease the insulin drip to 5.8. Will continue to monitor.

## 2015-10-03 NOTE — Progress Notes (Signed)
Hypoglycemic Event  CBG: 50  Treatment: D50 IV 25 mL  Symptoms: None  Follow-up CBG: Time:0415 CBG Result:78  Possible Reasons for Event:   Comments/MD notified:    Aimee Thompson, Aimee Thompson [facility]

## 2015-10-03 NOTE — Progress Notes (Addendum)
PROGRESS NOTE    Aimee Thompson  ZOX:[id number] DOB: January 13, 1971 DOA: 09/30/2015  PCP: Oliver Barre, MD   Brief Narrative:  45 y/o with asthma, HTN presenting with abdominal pain and vomiting. Found to have acute pancreatitis per labs and CT scan. Has triglyceride level > 5000. No h/o of elevated Triglycerides in the past. She has been having issues with abdominal discomfort for about 6-7 months now. Was being treated with a PPI and Bentyl.  Subjective: Continues to have abdominal discomfort and burping. Pain is not severe. Not passing gas or having BMs. Has vomiting when she exerts her self i.e. bathing yesterday. Has headache today- moderate intensity. Urinating well.   Assessment & Plan:   Principal Problem:   Acute pancreatitis/ hypertriglyceridemia   - has been having abdominal discomfort for many months as noted in narrative above - EGD in 2/17- no signs of inflammation - takes PPI and Bentyl at home per GI and PCP - Triglyceride level > 5000 on admission - last level in 2012 was 93 and in 2008 was 80 - hypertriglyceridemia treated with insulin infusion with D10>>  Triglycerides normalized  - Lipase improved significantly- stop insulin and D10 today - started Tricor 160 mg daily - NPO, cont pain control, IVF, Lasix - follow I and O carefully  Fevers -  Given Levaquin/ Flagyl on admission. I stopped them the next day as she had no signs of infection.  - Began to have fevers on 7/13. Fever 103 last night - placed on Primaxin and Vanc- no recurrent fever- UA negative- no respiratory symptoms - blood cultures from 7/12 and 7/14 negative - MRSA PCR negative - CXR and UA negative  Hyponatremia - unfortunately was due D10- started Lasix to expel free water- following I and O carefully - sodium 137 today  Thrombocytopenia - plt 241>> 126- follow- cont Lovenox for now  Mildly elevated LFTs - likely secondary to above  Lactic acidosis/ dehydration - resolved  Hypotension  with history of Essential hypertension, benign - Metoprolol on hold for now  Hypokalemia - replacing  Asthma - no flare at this time - albuterol, Symbicort  Depression/ anxiety - Lexapro and Xanax  ADD - Adderall     DVT prophylaxis: Lovenox Code Status: full code Family Communication: parents, sister Disposition Plan: follow in SDU Consultants:   PCCM Procedures:   none Antimicrobials:  Anti-infectives    Start     Dose/Rate Route Frequency Ordered Stop   10/02/15 2200  vancomycin (VANCOCIN) IVPB 750 mg/150 ml premix     750 mg 150 mL/hr over 60 Minutes Intravenous Every 12 hours 10/02/15 1928     10/02/15 2000  imipenem-cilastatin (PRIMAXIN) 500 mg in sodium chloride 0.9 % 100 mL IVPB     500 mg 200 mL/hr over 30 Minutes Intravenous Every 6 hours 10/02/15 1925     10/01/15 2200  levofloxacin (LEVAQUIN) IVPB 750 mg  Status:  Discontinued     750 mg 100 mL/hr over 90 Minutes Intravenous Every 24 hours 09/30/15 2206 10/01/15 0915   10/01/15 0600  metroNIDAZOLE (FLAGYL) IVPB 500 mg  Status:  Discontinued     500 mg 100 mL/hr over 60 Minutes Intravenous Every 6 hours 09/30/15 2206 10/01/15 0915   09/30/15 2200  levofloxacin (LEVAQUIN) IVPB 750 mg     750 mg 100 mL/hr over 90 Minutes Intravenous  Once 09/30/15 2152 10/01/15 0018   09/30/15 2200  metroNIDAZOLE (FLAGYL) IVPB 500 mg     500 mg 100 mL/hr  over 60 Minutes Intravenous  Once 09/30/15 2152 10/01/15 0127       Objective: Filed Vitals:   10/03/15 0815 10/03/15 0900 10/03/15 1055 10/03/15 1200  BP: 94/67  100/53   Pulse: 88  88   Temp:  99.1 F (37.3 C)  98.1 F (36.7 C)  TempSrc:  Oral  Oral  Resp: 10  14   Height:      Weight:      SpO2: 100%  96%     Intake/Output Summary (Last 24 hours) at 10/03/15 1321 Last data filed at 10/03/15 0914  Gross per 24 hour  Intake   3200 ml  Output   3200 ml  Net      0 ml   Filed Weights   09/30/15 1505  Weight: 64.864 kg (143 lb)     Examination: General exam: Appears comfortable  HEENT: PERRLA, oral mucosa moist, no sclera icterus or thrush Respiratory system: Clear to auscultation. Respiratory effort normal. Cardiovascular system: S1 & S2 heard, RRR.  No murmurs  Gastrointestinal system: Abdomen soft,  tender in epigastrium,  Distended , active bowel sounds. No organomegaly Central nervous system: Alert and oriented. No focal neurological deficits. Extremities: No cyanosis, clubbing or edema Skin: No rashes or ulcers Psychiatry:  Mood & affect appropriate.     Data Reviewed: I have personally reviewed following labs and imaging studies  CBC:  Recent Labs Lab 09/30/15 2230 10/02/15 1655 10/03/15 0316  WBC 10.6* 5.7 4.3  NEUTROABS 8.7*  --   --   HGB 15.8* 12.1 10.6*  HCT 44.4 35.1* 29.6*  MCV 94.9 97.0 94.3  PLT 241 147* 126*   Basic Metabolic Panel:  Recent Labs Lab 10/01/15 0800 10/01/15 1819 10/02/15 0315 10/02/15 1655 10/03/15 0316  NA 135 128* 128* 131* 137  K 4.0 3.7 3.2* 2.7* 3.0*  CL 105 97* 97* 94* 103  CO2 GLUCOSE 102* 75 77 76 45*  BUN 6 <5* <5* <5* <5*  CREATININE 0.75 0.75 0.33* 0.59 0.50  CALCIUM 6.9* 6.5* 6.2* 6.5* 6.8*   GFR: Estimated Creatinine Clearance: 78.5 mL/min (by C-G formula based on Cr of 0.5). Liver Function Tests:  Recent Labs Lab 09/30/15 1705  AST 241*  ALT 182*  ALKPHOS 83  BILITOT 1.4*  PROT 6.5  ALBUMIN 3.6    Recent Labs Lab 09/30/15 1705 10/01/15 0800 10/02/15 0315 10/03/15 0316  LIPASE 655* 1188* 400* 187*   No results for input(s): AMMONIA in the last 168 hours. Coagulation Profile: No results for input(s): INR, PROTIME in the last 168 hours. Cardiac Enzymes: No results for input(s): CKTOTAL, CKMB, CKMBINDEX, TROPONINI in the last 168 hours. BNP (last 3 results) No results for input(s): PROBNP in the last 8760 hours. HbA1C: No results for input(s): HGBA1C in the last 72 hours. CBG:  Recent Labs Lab  10/03/15 0418 10/03/15 0526 10/03/15 0607 10/03/15 0739 10/03/15 1029  GLUCAP 78 57* 110* 67 194*   Lipid Profile:  Recent Labs  10/02/15 1655 10/03/15 0316  TRIG 211* 119   Thyroid Function Tests:  Recent Labs  10/02/15 1655  TSH 0.923  FREET4 0.86   Anemia Panel: No results for input(s): VITAMINB12, FOLATE, FERRITIN, TIBC, IRON, RETICCTPCT in the last 72 hours. Urine analysis:    Component Value Date/Time   COLORURINE AMBER* 09/30/2015 1823   APPEARANCEUR CLOUDY* 09/30/2015 1823   LABSPEC 1.028 09/30/2015 1823   PHURINE 7.0 09/30/2015 1823   GLUCOSEU NEGATIVE 09/30/2015 1823  GLUCOSEU NEGATIVE 03/26/2015 1704   HGBUR MODERATE* 09/30/2015 1823   BILIRUBINUR NEGATIVE 09/30/2015 1823   KETONESUR NEGATIVE 09/30/2015 1823   PROTEINUR NEGATIVE 09/30/2015 1823   UROBILINOGEN 0.2 03/26/2015 1704   NITRITE NEGATIVE 09/30/2015 1823   LEUKOCYTESUR SMALL* 09/30/2015 1823   Sepsis Labs: @LABRCNTIP (procalcitonin:4,lacticidven:4) ) Recent Results (from the past 240 hour(s))  Blood culture (routine x 2)     Status: None (Preliminary result)   Collection Time: 09/30/15  9:29 PM  Result Value Ref Range Status   Specimen Description RIGHT ANTECUBITAL  Final   Special Requests BOTTLES DRAWN AEROBIC AND ANAEROBIC 5CC  Final   Culture   Final    NO GROWTH 2 DAYS [facility]    Report Status PENDING  Incomplete  Blood culture (routine x 2)     Status: None (Preliminary result)   Collection Time: 09/30/15 10:30 PM  Result Value Ref Range Status   Specimen Description BLOOD LEFT ANTECUBITAL  Final   Special Requests BOTTLES DRAWN AEROBIC AND ANAEROBIC 5CC EACH  Final   Culture   Final    NO GROWTH 2 DAYS Performed at [facility]    Report Status PENDING  Incomplete  MRSA PCR Screening     Status: None   Collection Time: 10/01/15 12:48 AM  Result Value Ref Range Status   MRSA by PCR NEGATIVE NEGATIVE Final    Comment:        The GeneXpert  MRSA Assay (FDA approved for NASAL specimens only), is one component of a comprehensive MRSA colonization surveillance program. It is not intended to diagnose MRSA infection nor to guide or monitor treatment for MRSA infections.          Radiology Studies: Dg Abd Portable 1v  10/02/2015  CLINICAL DATA:  Distended abdomen EXAM: PORTABLE ABDOMEN - 1 VIEW COMPARISON:  None. FINDINGS: Gas within mildly prominent large bowel segments. Oral contrast seen in the right colon. No evidence of bowel obstruction. No free air organomegaly. No acute bony abnormality. IMPRESSION: No acute findings. Electronically Signed   By: Charlett NoseKevin  Dover M.D.   On: 10/02/2015 14:15      Scheduled Meds: . dicyclomine  10 mg Oral TID AC  . enoxaparin (LOVENOX) injection  40 mg Subcutaneous QHS  . escitalopram  10 mg Oral Daily  . fenofibrate  160 mg Oral Daily  . furosemide  20 mg Intravenous Once  . imipenem-cilastatin  500 mg Intravenous Q6H  . mometasone-formoterol  2 puff Inhalation BID  . ondansetron (ZOFRAN) IV  4 mg Intravenous Q6H  . pantoprazole  40 mg Oral BID  . potassium chloride  10 mEq Intravenous Q1 Hr x 4  . thiamine  100 mg Oral Daily  . vancomycin  750 mg Intravenous Q12H   Continuous Infusions: . sodium chloride 50 mL/hr at 10/02/15 1650  . dextrose 75 mL/hr at 10/03/15 1107     LOS: 3 days    Time spent in minutes: 35    Aimee Helfrich, MD [facility] Pager: [email or web address] Password [email or web address] 10/03/2015, 1:21 PM

## 2015-10-04 DIAGNOSIS — K8591 Acute pancreatitis with uninfected necrosis, unspecified: Principal | ICD-10-CM

## 2015-10-04 LAB — GLUCOSE, CAPILLARY
Glucose-Capillary: 142 mg/dL — ABNORMAL HIGH (ref 65–99)
Glucose-Capillary: 169 mg/dL — ABNORMAL HIGH (ref 65–99)
Glucose-Capillary: 125 mg/dL — ABNORMAL HIGH (ref 65–99)

## 2015-10-04 LAB — COMPREHENSIVE METABOLIC PANEL
ALT: 40 U/L (ref 14–54)
AST: 25 U/L (ref 15–41)
Albumin: 2.5 g/dL — ABNORMAL LOW (ref 3.5–5.0)
Alkaline Phosphatase: 54 U/L (ref 38–126)
Anion gap: 5 (ref 5–15)
BUN: <5 mg/dL — ABNORMAL LOW (ref 6–20)
CHLORIDE: 101 mmol/L (ref 101–111)
CO2: 31 mmol/L (ref 22–32)
CREATININE: 0.54 mg/dL (ref 0.44–1.00)
Calcium: 7.9 mg/dL — ABNORMAL LOW (ref 8.9–10.3)
Glucose, Bld: 142 mg/dL — ABNORMAL HIGH (ref 65–99)
POTASSIUM: 3 mmol/L — AB (ref 3.5–5.1)
Sodium: 137 mmol/L (ref 135–145)
Total Bilirubin: 1.7 mg/dL — ABNORMAL HIGH (ref 0.3–1.2)
Total Protein: 5.5 g/dL — ABNORMAL LOW (ref 6.5–8.1)

## 2015-10-04 LAB — PROCALCITONIN: PROCALCITONIN: 0.16 ng/mL

## 2015-10-04 LAB — MAGNESIUM: MAGNESIUM: 1.6 mg/dL — AB (ref 1.7–2.4)

## 2015-10-04 LAB — TRIGLYCERIDES: TRIGLYCERIDES: 124 mg/dL (ref <150)

## 2015-10-04 MED ORDER — POTASSIUM CHLORIDE 20 MEQ/15ML (10%) PO SOLN
40.0000 meq | Freq: Once | ORAL | Status: AC
  Administered 2015-10-04: 40 meq via ORAL
  Filled 2015-10-04: qty 30

## 2015-10-04 NOTE — Consult Note (Signed)
Reason for Consult: necrotic pancreatitis Referring Physician: Dr. Rizwan  Aimee Thompson is an 45 y.o. female.  HPI: 45 yo female with 6 mo history of intermittent abdominal pain. She had been seeing a GI doctor during that time for the working diagnosis of IBS. In the last 1-2 weeks she had worsening abdominal pain, nausea and vomiting and was recommended to go to the ER for possible appendicitis and was found to have acute pancreatitis. Further work up has revealed triglyceride elevation to >5000 which has now come back down to normal levels. She continues to have epigastric abdominal pain as well as diffuse abdominal pain radiating to her back. She feels it is slightly improved from the last few days. She did not vomit today but did yesterday and the day before.  Past Medical History  Diagnosis Date  . ASTHMA   . HEART MURMUR, HX OF   . ADD (attention deficit disorder)   . Hypertension   . Hypertriglyceridemia, familial     Past Surgical History  Procedure Laterality Date  . Laproscopy    . Shoulder surgery Right 2012  . Cesarean section  2011  . Knee surgery  1990    Family History  Problem Relation Age of Onset  . Hypertension Other   . Colon cancer Neg Hx   . Esophageal cancer Neg Hx   . Pancreatic cancer Neg Hx   . Colitis Neg Hx   . Crohn's disease Neg Hx   . Kidney disease Neg Hx   . Liver disease Neg Hx   . Heart disease Neg Hx   . Stomach cancer Neg Hx     Social History:  reports that she has never smoked. She has never used smokeless tobacco. She reports that she drinks alcohol. She reports that she does not use illicit drugs.  Allergies:  Allergies  Allergen Reactions  . Penicillins Hives    Has patient had a PCN reaction causing immediate rash, facial/tongue/throat swelling, SOB or lightheadedness with hypotension: no Has patient had a PCN reaction causing severe rash involving mucus membranes or skin necrosis: no Has patient had a PCN reaction that  required hospitalization no Has patient had a PCN reaction occurring within the last 10 years: yes If all of the above answers are "NO", then may proceed with Cephalosporin use.  . Scallops [Shellfish Allergy] Itching and Other (See Comments)    Causes redness    Medications: I have reviewed the patient's current medications.  Results for orders placed or performed during the hospital encounter of 09/30/15 (from the past 48 hour(s))  Glucose, capillary     Status: Abnormal   Collection Time: 10/02/15 10:14 AM  Result Value Ref Range   Glucose-Capillary 112 (H) 65 - 99 mg/dL  Glucose, capillary     Status: None   Collection Time: 10/02/15 11:19 AM  Result Value Ref Range   Glucose-Capillary 94 65 - 99 mg/dL  Glucose, capillary     Status: None   Collection Time: 10/02/15 12:12 PM  Result Value Ref Range   Glucose-Capillary 89 65 - 99 mg/dL  Glucose, capillary     Status: None   Collection Time: 10/02/15  1:21 PM  Result Value Ref Range   Glucose-Capillary 97 65 - 99 mg/dL  Glucose, capillary     Status: None   Collection Time: 10/02/15  2:15 PM  Result Value Ref Range   Glucose-Capillary 94 65 - 99 mg/dL  Glucose, capillary     Status: None     Collection Time: 10/02/15  3:35 PM  Result Value Ref Range   Glucose-Capillary 96 65 - 99 mg/dL  Basic metabolic panel     Status: Abnormal   Collection Time: 10/02/15  4:55 PM  Result Value Ref Range   Sodium 131 (L) 135 - 145 mmol/L   Potassium 2.7 (LL) 3.5 - 5.1 mmol/L    Comment: CRITICAL RESULT CALLED TO, READ BACK BY AND VERIFIED WITH: M.CARPENTER RN AT 1744 ON 10/02/15 BY S.VANHOORNE    Chloride 94 (L) 101 - 111 mmol/L   CO2 30 22 - 32 mmol/L   Glucose, Bld 76 65 - 99 mg/dL   BUN <5 (L) 6 - 20 mg/dL   Creatinine, Ser 0.59 0.44 - 1.00 mg/dL   Calcium 6.5 (L) 8.9 - 10.3 mg/dL   GFR calc non Af Amer >60 >60 mL/min   GFR calc Af Amer >60 >60 mL/min    Comment: (NOTE) The eGFR has been calculated using the CKD EPI  equation. This calculation has not been validated in all clinical situations. eGFR's persistently <60 mL/min signify possible Chronic Kidney Disease.    Anion gap 7 5 - 15  Triglycerides     Status: Abnormal   Collection Time: 10/02/15  4:55 PM  Result Value Ref Range   Triglycerides 211 (H) <150 mg/dL    Comment: Performed at [facility]  T4, free     Status: None   Collection Time: 10/02/15  4:55 PM  Result Value Ref Range   Free T4 0.86 0.61 - 1.12 ng/dL    Comment: (NOTE) Biotin ingestion may interfere with free T4 tests. If the results are inconsistent with the TSH level, previous test results, or the clinical presentation, then consider biotin interference. If needed, order repeat testing after stopping biotin. Performed at [facility]   TSH     Status: None   Collection Time: 10/02/15  4:55 PM  Result Value Ref Range   TSH 0.923 0.350 - 4.500 uIU/mL  CBC     Status: Abnormal   Collection Time: 10/02/15  4:55 PM  Result Value Ref Range   WBC 5.7 4.0 - 10.5 K/uL   RBC 3.62 (L) 3.87 - 5.11 MIL/uL   Hemoglobin 12.1 12.0 - 15.0 g/dL    Comment: DELTA CHECK NOTED   HCT 35.1 (L) 36.0 - 46.0 %   MCV 97.0 78.0 - 100.0 fL   MCH 33.4 26.0 - 34.0 pg   MCHC 34.5 30.0 - 36.0 g/dL   RDW 12.9 11.5 - 15.5 %   Platelets 147 (L) 150 - 400 K/uL  Glucose, capillary     Status: Abnormal   Collection Time: 10/02/15  5:34 PM  Result Value Ref Range   Glucose-Capillary 110 (H) 65 - 99 mg/dL  Glucose, capillary     Status: Abnormal   Collection Time: 10/02/15  6:35 PM  Result Value Ref Range   Glucose-Capillary 130 (H) 65 - 99 mg/dL   Comment 1 Notify RN   Culture, blood (routine x 2)     Status: None (Preliminary result)   Collection Time: 10/02/15  7:10 PM  Result Value Ref Range   Specimen Description BLOOD LEFT ARM    Special Requests IN PEDIATRIC BOTTLE 4CC    Culture      NO GROWTH < 24 HOURS Performed at [facility]    Report Status PENDING    Culture, blood (routine x 2)     Status: None (Preliminary result)     Collection Time: 10/02/15  7:18 PM  Result Value Ref Range   Specimen Description BLOOD RIGHT ARM    Special Requests BOTTLES DRAWN AEROBIC AND ANAEROBIC 5CC    Culture      NO GROWTH < 24 HOURS Performed at [facility]    Report Status PENDING   Glucose, capillary     Status: None   Collection Time: 10/02/15  7:41 PM  Result Value Ref Range   Glucose-Capillary 84 65 - 99 mg/dL  Glucose, capillary     Status: None   Collection Time: 10/02/15 10:26 PM  Result Value Ref Range   Glucose-Capillary 80 65 - 99 mg/dL  Glucose, capillary     Status: None   Collection Time: 10/03/15 12:33 AM  Result Value Ref Range   Glucose-Capillary 74 65 - 99 mg/dL   Comment 1 Notify RN    Comment 2 Document in Chart   Glucose, capillary     Status: Abnormal   Collection Time: 10/03/15  1:36 AM  Result Value Ref Range   Glucose-Capillary 50 (L) 65 - 99 mg/dL  Glucose, capillary     Status: Abnormal   Collection Time: 10/03/15  2:08 AM  Result Value Ref Range   Glucose-Capillary 100 (H) 65 - 99 mg/dL  Basic metabolic panel     Status: Abnormal   Collection Time: 10/03/15  3:16 AM  Result Value Ref Range   Sodium 137 135 - 145 mmol/L   Potassium 3.0 (L) 3.5 - 5.1 mmol/L   Chloride 103 101 - 111 mmol/L   CO2 31 22 - 32 mmol/L   Glucose, Bld 45 (L) 65 - 99 mg/dL   BUN <5 (L) 6 - 20 mg/dL   Creatinine, Ser 0.50 0.44 - 1.00 mg/dL   Calcium 6.8 (L) 8.9 - 10.3 mg/dL   GFR calc non Af Amer >60 >60 mL/min   GFR calc Af Amer >60 >60 mL/min    Comment: (NOTE) The eGFR has been calculated using the CKD EPI equation. This calculation has not been validated in all clinical situations. eGFR's persistently <60 mL/min signify possible Chronic Kidney Disease.    Anion gap 3 (L) 5 - 15    Comment: RESULT REPEATED AND VERIFIED  CBC     Status: Abnormal   Collection Time: 10/03/15  3:16 AM  Result Value Ref Range   WBC 4.3 4.0  - 10.5 K/uL   RBC 3.14 (L) 3.87 - 5.11 MIL/uL   Hemoglobin 10.6 (L) 12.0 - 15.0 g/dL   HCT 29.6 (L) 36.0 - 46.0 %   MCV 94.3 78.0 - 100.0 fL   MCH 33.8 26.0 - 34.0 pg   MCHC 35.8 30.0 - 36.0 g/dL   RDW 13.0 11.5 - 15.5 %   Platelets 126 (L) 150 - 400 K/uL  Triglycerides     Status: None   Collection Time: 10/03/15  3:16 AM  Result Value Ref Range   Triglycerides 119 <150 mg/dL    Comment: Performed at [facility]  Lipase, blood     Status: Abnormal   Collection Time: 10/03/15  3:16 AM  Result Value Ref Range   Lipase 187 (H) 11 - 51 U/L  Glucose, capillary     Status: Abnormal   Collection Time: 10/03/15  3:46 AM  Result Value Ref Range   Glucose-Capillary 57 (L) 65 - 99 mg/dL   Comment 1 Notify RN    Comment 2 Document in Chart   Glucose, capillary  Status: None   Collection Time: 10/03/15  4:18 AM  Result Value Ref Range   Glucose-Capillary 78 65 - 99 mg/dL   Comment 1 Notify RN    Comment 2 Document in Chart   Glucose, capillary     Status: Abnormal   Collection Time: 10/03/15  5:26 AM  Result Value Ref Range   Glucose-Capillary 57 (L) 65 - 99 mg/dL   Comment 1 Notify RN    Comment 2 Document in Chart   Glucose, capillary     Status: Abnormal   Collection Time: 10/03/15  6:07 AM  Result Value Ref Range   Glucose-Capillary 110 (H) 65 - 99 mg/dL  Glucose, capillary     Status: None   Collection Time: 10/03/15  7:39 AM  Result Value Ref Range   Glucose-Capillary 67 65 - 99 mg/dL  Glucose, capillary     Status: Abnormal   Collection Time: 10/03/15 10:29 AM  Result Value Ref Range   Glucose-Capillary 194 (H) 65 - 99 mg/dL  Glucose, capillary     Status: Abnormal   Collection Time: 10/03/15  4:03 PM  Result Value Ref Range   Glucose-Capillary 127 (H) 65 - 99 mg/dL  Triglycerides     Status: None   Collection Time: 10/03/15  4:33 PM  Result Value Ref Range   Triglycerides 91 <150 mg/dL    Comment: Performed at [facility]  Comprehensive  metabolic panel     Status: Abnormal   Collection Time: 10/03/15  4:33 PM  Result Value Ref Range   Sodium 136 135 - 145 mmol/L   Potassium 3.6 3.5 - 5.1 mmol/L   Chloride 102 101 - 111 mmol/L   CO2 28 22 - 32 mmol/L   Glucose, Bld 125 (H) 65 - 99 mg/dL   BUN <5 (L) 6 - 20 mg/dL   Creatinine, Ser 0.56 0.44 - 1.00 mg/dL   Calcium 7.8 (L) 8.9 - 10.3 mg/dL   Total Protein 5.7 (L) 6.5 - 8.1 g/dL   Albumin 2.7 (L) 3.5 - 5.0 g/dL   AST 33 15 - 41 U/L   ALT 45 14 - 54 U/L   Alkaline Phosphatase 59 38 - 126 U/L   Total Bilirubin 1.5 (H) 0.3 - 1.2 mg/dL   GFR calc non Af Amer >60 >60 mL/min   GFR calc Af Amer >60 >60 mL/min    Comment: (NOTE) The eGFR has been calculated using the CKD EPI equation. This calculation has not been validated in all clinical situations. eGFR's persistently <60 mL/min signify possible Chronic Kidney Disease.    Anion gap 6 5 - 15  Lactic acid, plasma     Status: None   Collection Time: 10/03/15  4:33 PM  Result Value Ref Range   Lactic Acid, Venous 1.8 0.5 - 1.9 mmol/L  Procalcitonin - Baseline     Status: None   Collection Time: 10/03/15  4:33 PM  Result Value Ref Range   Procalcitonin 0.20 ng/mL    Comment:        Interpretation: PCT (Procalcitonin) <= 0.5 ng/mL: Systemic infection (sepsis) is not likely. Local bacterial infection is possible. (NOTE)         ICU PCT Algorithm               Non ICU PCT Algorithm    ----------------------------     ------------------------------         PCT < 0.25 ng/mL                   PCT < 0.1 ng/mL     Stopping of antibiotics            Stopping of antibiotics       strongly encouraged.               strongly encouraged.    ----------------------------     ------------------------------       PCT level decrease by               PCT < 0.25 ng/mL       >= 80% from peak PCT       OR PCT 0.25 - 0.5 ng/mL          Stopping of antibiotics                                             encouraged.     Stopping of  antibiotics           encouraged.    ----------------------------     ------------------------------       PCT level decrease by              PCT >= 0.25 ng/mL       < 80% from peak PCT        AND PCT >= 0.5 ng/mL            Continuin g antibiotics                                              encouraged.       Continuing antibiotics            encouraged.    ----------------------------     ------------------------------     PCT level increase compared          PCT > 0.5 ng/mL         with peak PCT AND          PCT >= 0.5 ng/mL             Escalation of antibiotics                                          strongly encouraged.      Escalation of antibiotics        strongly encouraged.   Glucose, capillary     Status: Abnormal   Collection Time: 10/03/15  6:48 PM  Result Value Ref Range   Glucose-Capillary 119 (H) 65 - 99 mg/dL  Triglycerides     Status: None   Collection Time: 10/04/15  3:21 AM  Result Value Ref Range   Triglycerides 124 <150 mg/dL    Comment: Performed at [facility]  Comprehensive metabolic panel     Status: Abnormal   Collection Time: 10/04/15  3:21 AM  Result Value Ref Range   Sodium 137 135 - 145 mmol/L   Potassium 3.0 (L) 3.5 - 5.1 mmol/L   Chloride 101 101 - 111 mmol/L   CO2 31 22 - 32 mmol/L   Glucose, Bld 142 (H) 65 - 99 mg/dL   BUN <5 (L) 6 - 20 mg/dL   Creatinine,   Ser 0.54 0.44 - 1.00 mg/dL   Calcium 7.9 (L) 8.9 - 10.3 mg/dL   Total Protein 5.5 (L) 6.5 - 8.1 g/dL   Albumin 2.5 (L) 3.5 - 5.0 g/dL   AST 25 15 - 41 U/L   ALT 40 14 - 54 U/L   Alkaline Phosphatase 54 38 - 126 U/L   Total Bilirubin 1.7 (H) 0.3 - 1.2 mg/dL   GFR calc non Af Amer >60 >60 mL/min   GFR calc Af Amer >60 >60 mL/min    Comment: (NOTE) The eGFR has been calculated using the CKD EPI equation. This calculation has not been validated in all clinical situations. eGFR's persistently <60 mL/min signify possible Chronic Kidney Disease.    Anion gap 5 5 - 15   Procalcitonin     Status: None   Collection Time: 10/04/15  3:21 AM  Result Value Ref Range   Procalcitonin 0.16 ng/mL    Comment:        Interpretation: PCT (Procalcitonin) <= 0.5 ng/mL: Systemic infection (sepsis) is not likely. Local bacterial infection is possible. (NOTE)         ICU PCT Algorithm               Non ICU PCT Algorithm    ----------------------------     ------------------------------         PCT < 0.25 ng/mL                 PCT < 0.1 ng/mL     Stopping of antibiotics            Stopping of antibiotics       strongly encouraged.               strongly encouraged.    ----------------------------     ------------------------------       PCT level decrease by               PCT < 0.25 ng/mL       >= 80% from peak PCT       OR PCT 0.25 - 0.5 ng/mL          Stopping of antibiotics                                             encouraged.     Stopping of antibiotics           encouraged.    ----------------------------     ------------------------------       PCT level decrease by              PCT >= 0.25 ng/mL       < 80% from peak PCT        AND PCT >= 0.5 ng/mL            Continuin g antibiotics                                              encouraged.       Continuing antibiotics            encouraged.    ----------------------------     ------------------------------     PCT level increase compared            PCT > 0.5 ng/mL         with peak PCT AND          PCT >= 0.5 ng/mL             Escalation of antibiotics                                          strongly encouraged.      Escalation of antibiotics        strongly encouraged.   Magnesium     Status: Abnormal   Collection Time: 10/04/15  3:21 AM  Result Value Ref Range   Magnesium 1.6 (L) 1.7 - 2.4 mg/dL    Ct Abdomen Pelvis W Contrast  10/03/2015  CLINICAL DATA:  HTN presenting with abdominal pain and vomiting. Found to have acute pancreatitis per labs and CT scan. Has triglyceride level > 5000. No h/o  of elevated Triglycerides in the past. She has been having issues with abdominal discomfort for 6-7 months. Epigastric pain. Nausea, vomiting. Alcohol dependency. EXAM: CT ABDOMEN AND PELVIS WITH CONTRAST TECHNIQUE: Multidetector CT imaging of the abdomen and pelvis was performed using the standard protocol following bolus administration of intravenous contrast. CONTRAST:  117m ISOVUE-300 IOPAMIDOL (ISOVUE-300) INJECTION 61% COMPARISON:  09/30/2015 and 07/06/2005 FINDINGS: Lower chest: Increased bilateral pleural effusions associated with bibasilar atelectasis, right greater than left. Heart size is normal. No imaged pericardial effusion or significant coronary artery calcifications. Hepatobiliary: No focal hepatic mass identified. Sludge versus contrast within the gallbladder. Pancreas: Significant peripancreatic fluid, increased over prior. Fluid extends throughout the retroperitoneum and into the pelvis. No evidence for pseudocyst. There continues to be a small area of low-attenuation along the posterior aspect of pancreas measuring 10 mm and stable in appearance. Findings are consistent with focal necrosis, focal pancreatitis, or pancreatic cysts. Small pseudocyst within the pancreatic parenchyma is possible but less likely. Spleen: Normal in appearance. Renal/Adrenal: Adrenal glands have a normal appearance. No hydronephrosis or focal renal mass. There is enhancement of the right ureteral wall, likely secondary to pancreatic exudates. Gastrointestinal tract: Stomach is normal in appearance. The appendix is well seen and has a normal appearance. Small bowel loops have a normal appearance. There is mild thickening of the descending colon wall, likely secondary to pancreatic exudates. Reproductive/Pelvis: The uterus is present.  No adnexal mass. Vascular/Lymphatic: No evidence for aortic aneurysm. No retroperitoneal or mesenteric adenopathy. Musculoskeletal/Abdominal wall: Abdominal wall is unremarkable.  Visualized osseous structures have a normal appearance. Other: None IMPRESSION: 1. Increased pancreatic exudates in the retroperitoneum and peritoneal cavity. 2. Stable appearance of small low-attenuation lesion within the posterior aspect of pancreas, consistent with small area of focal pancreatitis, focal necrosis, small cyst, or pseudocyst. 3. Enhancement of the right ureteral wall, likely secondary to pancreatic exudates. 4. Mild thickening of the descending colon, likely secondary to pancreatitis. 5. Increased bilateral pleural effusions and bibasilar atelectasis. 6. Sludge versus contrast within the gallbladder. Electronically Signed   By: ENolon NationsM.D.   On: 10/03/2015 18:07   Dg Abd Portable 1v  10/02/2015  CLINICAL DATA:  Distended abdomen EXAM: PORTABLE ABDOMEN - 1 VIEW COMPARISON:  None. FINDINGS: Gas within mildly prominent large bowel segments. Oral contrast seen in the right colon. No evidence of bowel obstruction. No free air organomegaly. No acute bony abnormality. IMPRESSION: No acute findings. Electronically Signed   By: KRolm BaptiseM.D.  On: 10/02/2015 14:15    Review of Systems  Constitutional: Negative for fever and chills.  HENT: Negative for hearing loss.   Eyes: Negative for blurred vision and double vision.  Respiratory: Negative for cough and hemoptysis.   Cardiovascular: Negative for chest pain and palpitations.  Gastrointestinal: Positive for nausea, vomiting and abdominal pain.  Genitourinary: Negative for dysuria and urgency.  Musculoskeletal: Negative for myalgias and neck pain.  Skin: Negative for itching and rash.  Neurological: Negative for dizziness, tingling and headaches.  Endo/Heme/Allergies: Does not bruise/bleed easily.  Psychiatric/Behavioral: Negative for depression and suicidal ideas.   Blood pressure 119/72, pulse 80, temperature 99 F (37.2 C), temperature source Oral, resp. rate 19, height 5' 2" (1.575 m), weight 70.9 kg (156 lb 4.9 oz),  last menstrual period 09/16/2015, SpO2 97 %. Physical Exam  Vitals reviewed. Constitutional: She is oriented to person, place, and time. She appears well-developed and well-nourished.  HENT:  Head: Normocephalic and atraumatic.  Eyes: Conjunctivae and EOM are normal. Pupils are equal, round, and reactive to light.  Neck: Normal range of motion. Neck supple.  Cardiovascular: Normal rate and regular rhythm.   Respiratory: Effort normal and breath sounds normal.  GI: Soft. She exhibits no distension. There is tenderness in the epigastric area.  Musculoskeletal: Normal range of motion.  Neurological: She is alert and oriented to person, place, and time.  Skin: Skin is warm and dry.  Psychiatric: She has a normal mood and affect. Her behavior is normal.    Assessment/Plan: 45 yo female with severe pancreatitis likely 2/2 hyper-triglyceridemia. 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 She has had no previous episodes but has had abdominal complaints for months previous with significant weight loss (15-20lb) in the period. Today her abdomen exam is moderate for tenderness, procalcitonin appears to be decreasing (though an initial level would have been helpful), her calcium normals are normalizing, though she has had multiple episodes of hypoglycemia and now is on D10, this is a better sign than her previous insulin requirements.  In the next 48h the largest decision will be nutrition: If her pain worsens or her clinical course moves backward then TPN should be started. If she continues to improve then Post pyloric feeding tube or trial of oral low fat diet would be reasonable. Currently, I recommend staying NPO and continuing the d10  Given the presence of sterile necrosis, I agree with the plan to proceed with Abx coverage using imipenem and would continue to the point of restarting enteral nutrition.  Continue pain  control, recommended getting out of bed    Luke Aaron Kinsinger 10/04/2015, 9:39 AM      

## 2015-10-04 NOTE — Progress Notes (Signed)
PROGRESS NOTE    Aimee Thompson  JYN:[id number] DOB: 11/19/1970 DOA: 09/30/2015  PCP: Oliver BarreJames John, MD   Brief Narrative:  45 y/o with asthma, HTN presenting with abdominal pain and vomiting. Found to have acute pancreatitis per labs and CT scan. Has triglyceride level > 5000. No h/o of elevated Triglycerides in the past. She has been having issues with abdominal discomfort for about 6-7 months now. Was being treated with a PPI and Bentyl.  Subjective: No further vomiting. Abdominal discomfort continues but is not severe. No BM. Is passing gas.   Assessment & Plan:   Principal Problem:   Acute pancreatitis/ hypertriglyceridemia   - has been having abdominal discomfort for many months as noted in narrative above - EGD in 2/17- no signs of inflammation - takes PPI and Bentyl at home per GI and PCP - Triglyceride level > 5000 on admission - last level in 2012 was 93 and in 2008 was 80 - hypertriglyceridemia treated with insulin infusion with D10>>  Triglycerides normalized  - Lipase improved significantly- stop insulin and D10 today - started Tricor 160 mg daily - NPO, cont pain control, IVF, Lasix PRN, follow I and O carefully - repeat CT shows increased fluid collections without obvious pseudocysts and a small amount of necrosis of the pancrease- have asked for Gen surgery assistance on helping to decide about nurtition  Fevers -  Given Levaquin/ Flagyl on admission. I stopped them the next day as she had no signs of infection.  - Began to have fevers on 7/13. Fever 103 last night - placed on Primaxin and Vanc- no recurrent fever- UA negative- no respiratory symptoms - blood cultures from 7/12 and 7/14 negative - MRSA PCR negative - CXR and UA negative - d/c Vanc today- cont Primaxin for pancreatic necrosis  Hypoglycemia - cont D 10 as sugars are in low 100s on 75 cc  Hyponatremia - unfortunately was due D10- started Lasix to expel free water- following I and O carefully -  sodium 137 today  Thrombocytopenia - plt 241>> 126- follow- cont Lovenox for now  Mildly elevated LFTs - likely secondary to above- T bili still elevated- AST and ALT normalized  Lactic acidosis/ dehydration - resolved  Hypotension with history of Essential hypertension, benign - Metoprolol on hold for now  Hypokalemia - replacing  Asthma - no flare at this time - albuterol, Symbicort  Depression/ anxiety - Lexapro and Xanax  ADD - Adderall     DVT prophylaxis: Lovenox Code Status: full code Family Communication: parents, sister Disposition Plan: follow in SDU Consultants:   PCCM Procedures:   none Antimicrobials:  Anti-infectives    Start     Dose/Rate Route Frequency Ordered Stop   10/02/15 2200  vancomycin (VANCOCIN) IVPB 750 mg/150 ml premix     750 mg 150 mL/hr over 60 Minutes Intravenous Every 12 hours 10/02/15 1928     10/02/15 2000  imipenem-cilastatin (PRIMAXIN) 500 mg in sodium chloride 0.9 % 100 mL IVPB     500 mg 200 mL/hr over 30 Minutes Intravenous Every 6 hours 10/02/15 1925     10/01/15 2200  levofloxacin (LEVAQUIN) IVPB 750 mg  Status:  Discontinued     750 mg 100 mL/hr over 90 Minutes Intravenous Every 24 hours 09/30/15 2206 10/01/15 0915   10/01/15 0600  metroNIDAZOLE (FLAGYL) IVPB 500 mg  Status:  Discontinued     500 mg 100 mL/hr over 60 Minutes Intravenous Every 6 hours 09/30/15 2206 10/01/15 0915  09/30/15 2200  levofloxacin (LEVAQUIN) IVPB 750 mg     750 mg 100 mL/hr over 90 Minutes Intravenous  Once 09/30/15 2152 10/01/15 0018   09/30/15 2200  metroNIDAZOLE (FLAGYL) IVPB 500 mg     500 mg 100 mL/hr over 60 Minutes Intravenous  Once 09/30/15 2152 10/01/15 0127       Objective: Filed Vitals:   10/04/15 0700 10/04/15 0727 10/04/15 0800 10/04/15 1200  BP:  119/72    Pulse:  80    Temp:   99 F (37.2 C) 98 F (36.7 C)  TempSrc:   Oral Oral  Resp:  19    Height:      Weight: 70.9 kg (156 lb 4.9 oz)     SpO2:  97%       Intake/Output Summary (Last 24 hours) at 10/04/15 1346 Last data filed at 10/04/15 1000  Gross per 24 hour  Intake   2085 ml  Output   2400 ml  Net   -315 ml   Filed Weights   09/30/15 1505 10/03/15 1400 10/04/15 0700  Weight: 64.864 kg (143 lb) 68.1 kg (150 lb 2.1 oz) 70.9 kg (156 lb 4.9 oz)    Examination: General exam: Appears comfortable  HEENT: PERRLA, oral mucosa moist, no sclera icterus or thrush Respiratory system: Clear to auscultation. Respiratory effort normal. Cardiovascular system: S1 & S2 heard, RRR.  No murmurs  Gastrointestinal system: Abdomen soft,  tender in epigastrium,  Distended , active bowel sounds. No organomegaly Central nervous system: Alert and oriented. No focal neurological deficits. Extremities: No cyanosis, clubbing or edema Skin: No rashes or ulcers Psychiatry:  Mood & affect appropriate.     Data Reviewed: I have personally reviewed following labs and imaging studies  CBC:  Recent Labs Lab 09/30/15 2230 10/02/15 1655 10/03/15 0316  WBC 10.6* 5.7 4.3  NEUTROABS 8.7*  --   --   HGB 15.8* 12.1 10.6*  HCT 44.4 35.1* 29.6*  MCV 94.9 97.0 94.3  PLT 241 147* 126*   Basic Metabolic Panel:  Recent Labs Lab 10/02/15 0315 10/02/15 1655 10/03/15 0316 10/03/15 1633 10/04/15 0321  NA 128* 131* 137 136 137  K 3.2* 2.7* 3.0* 3.6 3.0*  CL 97* 94* 103 102 101  CO2 GLUCOSE 77 76 45* 125* 142*  BUN <5* <5* <5* <5* <5*  CREATININE 0.33* 0.59 0.50 0.56 0.54  CALCIUM 6.2* 6.5* 6.8* 7.8* 7.9*  MG  --   --   --   --  1.6*   GFR: Estimated Creatinine Clearance: 81.9 mL/min (by C-G formula based on Cr of 0.54). Liver Function Tests:  Recent Labs Lab 09/30/15 1705 10/03/15 1633 10/04/15 0321  AST 241* 33 25  ALT 182* 45 40  ALKPHOS 83 59 54  BILITOT 1.4* 1.5* 1.7*  PROT 6.5 5.7* 5.5*  ALBUMIN 3.6 2.7* 2.5*    Recent Labs Lab 09/30/15 1705 10/01/15 0800 10/02/15 0315 10/03/15 0316  LIPASE 655* 1188* 400* 187*    No results for input(s): AMMONIA in the last 168 hours. Coagulation Profile: No results for input(s): INR, PROTIME in the last 168 hours. Cardiac Enzymes: No results for input(s): CKTOTAL, CKMB, CKMBINDEX, TROPONINI in the last 168 hours. BNP (last 3 results) No results for input(s): PROBNP in the last 8760 hours. HbA1C: No results for input(s): HGBA1C in the last 72 hours. CBG:  Recent Labs Lab 10/03/15 0739 10/03/15 1029 10/03/15 1603 10/03/15 1848 10/04/15 1018  GLUCAP 67 194* 127*  119* 169*   Lipid Profile:  Recent Labs  10/03/15 1633 10/04/15 0321  TRIG 91 124   Thyroid Function Tests:  Recent Labs  10/02/15 1655  TSH 0.923  FREET4 0.86   Anemia Panel: No results for input(s): VITAMINB12, FOLATE, FERRITIN, TIBC, IRON, RETICCTPCT in the last 72 hours. Urine analysis:    Component Value Date/Time   COLORURINE AMBER* 09/30/2015 1823   APPEARANCEUR CLOUDY* 09/30/2015 1823   LABSPEC 1.028 09/30/2015 1823   PHURINE 7.0 09/30/2015 1823   GLUCOSEU NEGATIVE 09/30/2015 1823   GLUCOSEU NEGATIVE 03/26/2015 1704   HGBUR MODERATE* 09/30/2015 1823   BILIRUBINUR NEGATIVE 09/30/2015 1823   KETONESUR NEGATIVE 09/30/2015 1823   PROTEINUR NEGATIVE 09/30/2015 1823   UROBILINOGEN 0.2 03/26/2015 1704   NITRITE NEGATIVE 09/30/2015 1823   LEUKOCYTESUR SMALL* 09/30/2015 1823   Sepsis Labs: @LABRCNTIP (procalcitonin:4,lacticidven:4) ) Recent Results (from the past 240 hour(s))  Blood culture (routine x 2)     Status: None (Preliminary result)   Collection Time: 09/30/15  9:29 PM  Result Value Ref Range Status   Specimen Description RIGHT ANTECUBITAL  Final   Special Requests BOTTLES DRAWN AEROBIC AND ANAEROBIC 5CC  Final   Culture   Final    NO GROWTH 3 DAYS Performed at [facility]    Report Status PENDING  Incomplete  Blood culture (routine x 2)     Status: None (Preliminary result)   Collection Time: 09/30/15 10:30 PM  Result Value Ref Range Status    Specimen Description BLOOD LEFT ANTECUBITAL  Final   Special Requests BOTTLES DRAWN AEROBIC AND ANAEROBIC 5CC EACH  Final   Culture   Final    NO GROWTH 3 DAYS Performed at [facility]    Report Status PENDING  Incomplete  MRSA PCR Screening     Status: None   Collection Time: 10/01/15 12:48 AM  Result Value Ref Range Status   MRSA by PCR NEGATIVE NEGATIVE Final    Comment:        The GeneXpert MRSA Assay (FDA approved for NASAL specimens only), is one component of a comprehensive MRSA colonization surveillance program. It is not intended to diagnose MRSA infection nor to guide or monitor treatment for MRSA infections.   Culture, blood (routine x 2)     Status: None (Preliminary result)   Collection Time: 10/02/15  7:10 PM  Result Value Ref Range Status   Specimen Description BLOOD LEFT ARM  Final   Special Requests IN PEDIATRIC BOTTLE 4CC  Final   Culture   Final    NO GROWTH < 24 HOURS Performed at [facility]    Report Status PENDING  Incomplete  Culture, blood (routine x 2)     Status: None (Preliminary result)   Collection Time: 10/02/15  7:18 PM  Result Value Ref Range Status   Specimen Description BLOOD RIGHT ARM  Final   Special Requests BOTTLES DRAWN AEROBIC AND ANAEROBIC 5CC  Final   Culture   Final    NO GROWTH < 24 HOURS Performed at [facility]    Report Status PENDING  Incomplete         Radiology Studies: Ct Abdomen Pelvis W Contrast  10/03/2015  CLINICAL DATA:  HTN presenting with abdominal pain and vomiting. Found to have acute pancreatitis per labs and CT scan. Has triglyceride level > 5000. No h/o of elevated Triglycerides in the past. She has been having issues with abdominal discomfort for 6-7 months. Epigastric pain. Nausea, vomiting. Alcohol  dependency. EXAM: CT ABDOMEN AND PELVIS WITH CONTRAST TECHNIQUE: Multidetector CT imaging of the abdomen and pelvis was performed using the standard protocol following bolus  administration of intravenous contrast. CONTRAST:  ISOVUE-300 IOPAMIDOL (ISOVUE-300) INJECTION 61% COMPARISON:  09/30/2015 and 07/06/2005 FINDINGS: Lower chest: Increased bilateral pleural effusions associated with bibasilar atelectasis, right greater than left. Heart size is normal. No imaged pericardial effusion or significant coronary artery calcifications. Hepatobiliary: No focal hepatic mass identified. Sludge versus contrast within the gallbladder. Pancreas: Significant peripancreatic fluid, increased over prior. Fluid extends throughout the retroperitoneum and into the pelvis. No evidence for pseudocyst. There continues to be a small area of low-attenuation along the posterior aspect of pancreas measuring 10 mm and stable in appearance. Findings are consistent with focal necrosis, focal pancreatitis, or pancreatic cysts. Small pseudocyst within the pancreatic parenchyma is possible but less likely. Spleen: Normal in appearance. Renal/Adrenal: Adrenal glands have a normal appearance. No hydronephrosis or focal renal mass. There is enhancement of the right ureteral wall, likely secondary to pancreatic exudates. Gastrointestinal tract: Stomach is normal in appearance. The appendix is well seen and has a normal appearance. Small bowel loops have a normal appearance. There is mild thickening of the descending colon wall, likely secondary to pancreatic exudates. Reproductive/Pelvis: The uterus is present.  No adnexal mass. Vascular/Lymphatic: No evidence for aortic aneurysm. No retroperitoneal or mesenteric adenopathy. Musculoskeletal/Abdominal wall: Abdominal wall is unremarkable. Visualized osseous structures have a normal appearance. Other: None IMPRESSION: 1. Increased pancreatic exudates in the retroperitoneum and peritoneal cavity. 2. Stable appearance of small low-attenuation lesion within the posterior aspect of pancreas, consistent with small area of focal pancreatitis, focal necrosis, small cyst, or  pseudocyst. 3. Enhancement of the right ureteral wall, likely secondary to pancreatic exudates. 4. Mild thickening of the descending colon, likely secondary to pancreatitis. 5. Increased bilateral pleural effusions and bibasilar atelectasis. 6. Sludge versus contrast within the gallbladder. Electronically Signed   By: Norva Pavlov M.D.   On: 10/03/2015 18:07   Dg Abd Portable 1v  10/02/2015  CLINICAL DATA:  Distended abdomen EXAM: PORTABLE ABDOMEN - 1 VIEW COMPARISON:  None. FINDINGS: Gas within mildly prominent large bowel segments. Oral contrast seen in the right colon. No evidence of bowel obstruction. No free air organomegaly. No acute bony abnormality. IMPRESSION: No acute findings. Electronically Signed   By: Charlett Nose M.D.   On: 10/02/2015 14:15      Scheduled Meds: . enoxaparin (LOVENOX) injection  40 mg Subcutaneous QHS  . escitalopram  10 mg Oral Daily  . fenofibrate  160 mg Oral Daily  . furosemide  20 mg Intravenous Once  . imipenem-cilastatin  500 mg Intravenous Q6H  . mometasone-formoterol  2 puff Inhalation BID  . ondansetron (ZOFRAN) IV  4 mg Intravenous Q6H  . pantoprazole  40 mg Oral BID  . thiamine  100 mg Oral Daily  . vancomycin  750 mg Intravenous Q12H   Continuous Infusions: . sodium chloride 50 mL/hr at 10/04/15 0500  . dextrose 75 mL/hr at 10/04/15 0500     LOS: 4 days    Time spent in minutes: 35    Rajohn Henery, MD [facility] Pager: [email or web address] Password [email or web address] 10/04/2015, 1:46 PM

## 2015-10-05 DIAGNOSIS — D61818 Other pancytopenia: Secondary | ICD-10-CM

## 2015-10-05 LAB — BASIC METABOLIC PANEL
Anion gap: 4 — ABNORMAL LOW (ref 5–15)
CHLORIDE: 102 mmol/L (ref 101–111)
CO2: 32 mmol/L (ref 22–32)
CREATININE: 0.47 mg/dL (ref 0.44–1.00)
Calcium: 7.8 mg/dL — ABNORMAL LOW (ref 8.9–10.3)
GFR calc Af Amer: >60 mL/min (ref >60)
GFR calc non Af Amer: >60 mL/min (ref >60)
Glucose, Bld: 130 mg/dL — ABNORMAL HIGH (ref 65–99)
POTASSIUM: 3.1 mmol/L — AB (ref 3.5–5.1)
Sodium: 138 mmol/L (ref 135–145)

## 2015-10-05 LAB — CULTURE, BLOOD (ROUTINE X 2)
CULTURE: NO GROWTH
Culture: NO GROWTH

## 2015-10-05 LAB — CBC
HEMATOCRIT: 27.9 % — AB (ref 36.0–46.0)
HEMOGLOBIN: 9.6 g/dL — AB (ref 12.0–15.0)
MCH: 34.4 pg — AB (ref 26.0–34.0)
MCHC: 34.4 g/dL (ref 30.0–36.0)
MCV: 100 fL (ref 78.0–100.0)
Platelets: 111 10*3/uL — ABNORMAL LOW (ref 150–400)
RBC: 2.79 MIL/uL — ABNORMAL LOW (ref 3.87–5.11)
RDW: 13.7 % (ref 11.5–15.5)
WBC: 3.5 10*3/uL — ABNORMAL LOW (ref 4.0–10.5)

## 2015-10-05 LAB — GLUCOSE, CAPILLARY
GLUCOSE-CAPILLARY: 125 mg/dL — AB (ref 65–99)
GLUCOSE-CAPILLARY: 138 mg/dL — AB (ref 65–99)
GLUCOSE-CAPILLARY: 142 mg/dL — AB (ref 65–99)
GLUCOSE-CAPILLARY: 153 mg/dL — AB (ref 65–99)
GLUCOSE-CAPILLARY: 157 mg/dL — AB (ref 65–99)
GLUCOSE-CAPILLARY: 187 mg/dL — AB (ref 65–99)
Glucose-Capillary: 127 mg/dL — ABNORMAL HIGH (ref 65–99)
Glucose-Capillary: 109 mg/dL — ABNORMAL HIGH (ref 65–99)
Glucose-Capillary: 127 mg/dL — ABNORMAL HIGH (ref 65–99)
Glucose-Capillary: 161 mg/dL — ABNORMAL HIGH (ref 65–99)
Glucose-Capillary: 173 mg/dL — ABNORMAL HIGH (ref 65–99)
Glucose-Capillary: 174 mg/dL — ABNORMAL HIGH (ref 65–99)
Glucose-Capillary: 176 mg/dL — ABNORMAL HIGH (ref 65–99)
Glucose-Capillary: 93 mg/dL (ref 65–99)
Glucose-Capillary: 98 mg/dL (ref 65–99)

## 2015-10-05 LAB — PROCALCITONIN: Procalcitonin: 0.18 ng/mL

## 2015-10-05 MED ORDER — MAGNESIUM SULFATE 4 GM/100ML IV SOLN
4.0000 g | Freq: Once | INTRAVENOUS | Status: AC
  Administered 2015-10-05: 4 g via INTRAVENOUS
  Filled 2015-10-05: qty 100

## 2015-10-05 MED ORDER — POTASSIUM CHLORIDE 10 MEQ/100ML IV SOLN
10.0000 meq | INTRAVENOUS | Status: AC
  Administered 2015-10-05 (×4): 10 meq via INTRAVENOUS
  Filled 2015-10-05 (×3): qty 100

## 2015-10-05 NOTE — Progress Notes (Signed)
Pharmacy Antibiotic Note  Aimee Thompson is a 45 y.o. female admitted on 09/30/2015 with acute pancreatitis, hypertriglyceridemia.  Patient now spiking fever.  Pharmacy has been consulted for vancomycin and Primaxin dosing for sepsis.  Patient has a penicillin allergy, no history of penicillin or cephalosporin use per chart review. CT shows increased fluid collections without obvious pseudocysts and a small amount of necrosis of the pancreas.  Today, 10/05/2015 Day #4 Primaxin 500mg  IV q6h Tmax 99.1, WBC decreasing (3.5), SCr low/stable, CrCl 82 ml/min PCT negative and Lactate clearing Cultures remain negative  Plan:  Dose remains appropriate  Continue to monitor renal function and f/u duration of therapy  Height: 5\' 2"  (157.5 cm) Weight: 156 lb 4.9 oz (70.9 kg) IBW/kg (Calculated) : 50.1  Temp (24hrs), Avg:98.7 F (37.1 C), Min:98 F (36.7 C), Max:99.1 F (37.3 C)   Recent Labs Lab 09/30/15 2145 09/30/15 2230 10/01/15 0103 10/01/15 0329 10/01/15 0800  10/02/15 1655 10/03/15 0316 10/03/15 1633 10/04/15 0321 10/05/15 0313  WBC  --  10.6*  --   --   --   --  5.7 4.3  --   --  3.5*  CREATININE  --   --   --   --  0.75  < > 0.59 0.50 0.56 0.54 0.47  LATICACIDVEN 4.31*  --  3.3* 2.2* 1.7  --   --   --  1.8  --   --   < > = values in this interval not displayed.  Estimated Creatinine Clearance: 81.9 mL/min (by C-G formula based on Cr of 0.47).    Allergies  Allergen Reactions  . Penicillins Hives    Has patient had a PCN reaction causing immediate rash, facial/tongue/throat swelling, SOB or lightheadedness with hypotension: no Has patient had a PCN reaction causing severe rash involving mucus membranes or skin necrosis: no Has patient had a PCN reaction that required hospitalization no Has patient had a PCN reaction occurring within the last 10 years: yes If all of the above answers are "NO", then may proceed with Cephalosporin use.  Caryl Never. Scallops [Shellfish Allergy]  Itching and Other (See Comments)    Causes redness    Antimicrobials this admission: 7/14 Vancomycin >> 7/16  7/14 Primaxin >>   Dose adjustments this admission: -  Microbiology results: 7/12 BCx x2: ngtd 7/13 MRSA PCR: neg 7/14 BCx x2: ngtd  Thank you for allowing pharmacy to be a part of this patient's care.  Loralee PacasErin Thomasine Klutts, PharmD, BCPS Pager: [phone] 10/05/2015 10:02 AM

## 2015-10-05 NOTE — Progress Notes (Signed)
Subjective: Patient reports pain is better than on admission. She reports generalized discomfort, but pinpoints the area of midepigastric just below the xiphoid. She has a few bowel sounds. Family inquired as to benefits of medical management versus surgical.  Objective: Vital signs in last 24 hours: Temp:  [98 F (36.7 C)-99.1 F (37.3 C)] 98.9 F (37.2 C) (07/17 0400) Pulse Rate:  [72-78] 72 (07/17 0010) Resp:  [11-18] 11 (07/17 0010) BP: (139-147)/(62-83) 139/62 mmHg (07/17 0010) SpO2:  [92 %-100 %] 92 % (07/17 0010) Weight:  [70.9 kg (156 lb 4.9 oz)] 70.9 kg (156 lb 4.9 oz) (07/17 0500) Last BM Date: 09/29/15 (per pt) NPO Urine 5100 Negative on I/O 2 liters Afebrile, VSS K+ 3.1, glucose 130 WBC down, H/H is down and Platelets continue to decline CT scan 10/03/15:  Significant peripancreatic fluid, increased over prior. Fluid extends throughout the retroperitoneum and into the pelvis. No evidence for pseudocyst. There continues to be a small area of low-attenuation along the posterior aspect of pancreas measuring 10 mm and stable in appearance. Findings are consistent with focal necrosis, focal pancreatitis, or pancreatic cysts. Small pseudocyst within the pancreatic parenchyma is possible but less likely. Intake/Output from previous day: 07/16 0701 - 07/17 0700 In: 3095 [P.O.:20; I.V.:2875; IV Piggyback:200] Out: 5100 [Urine:5100] Intake/Output this shift:    General appearance: alert, cooperative and no distress Resp: clear to auscultation bilaterally GI: soft, few BS, generally tender.  No distension, no acute peritonitit  Lab Results:   Recent Labs  10/03/15 0316 10/05/15 0313  WBC 4.3 3.5*  HGB 10.6* 9.6*  HCT 29.6* 27.9*  PLT 126* 111*    BMET  Recent Labs  10/04/15 0321 10/05/15 0313  NA 137 138  K 3.0* 3.1*  CL 101 102  CO2 31 32  GLUCOSE 142* 130*  BUN <5* <5*  CREATININE 0.54 0.47  CALCIUM 7.9* 7.8*   PT/INR No results for input(s):  LABPROT, INR in the last 72 hours.   Recent Labs Lab 09/30/15 1705 10/03/15 1633 10/04/15 0321  AST 241* 33 25  ALT 182* 45 40  ALKPHOS 83 59 54  BILITOT 1.4* 1.5* 1.7*  PROT 6.5 5.7* 5.5*  ALBUMIN 3.6 2.7* 2.5*     Lipase     Component Value Date/Time   LIPASE 187* 10/03/2015 0316     Studies/Results: Ct Abdomen Pelvis W Contrast  10/03/2015  CLINICAL DATA:  HTN presenting with abdominal pain and vomiting. Found to have acute pancreatitis per labs and CT scan. Has triglyceride level > 5000. No h/o of elevated Triglycerides in the past. She has been having issues with abdominal discomfort for 6-7 months. Epigastric pain. Nausea, vomiting. Alcohol dependency. EXAM: CT ABDOMEN AND PELVIS WITH CONTRAST TECHNIQUE: Multidetector CT imaging of the abdomen and pelvis was performed using the standard protocol following bolus administration of intravenous contrast. CONTRAST:  ISOVUE-300 IOPAMIDOL (ISOVUE-300) INJECTION 61% COMPARISON:  09/30/2015 and 07/06/2005 FINDINGS: Lower chest: Increased bilateral pleural effusions associated with bibasilar atelectasis, right greater than left. Heart size is normal. No imaged pericardial effusion or significant coronary artery calcifications. Hepatobiliary: No focal hepatic mass identified. Sludge versus contrast within the gallbladder. Pancreas: Significant peripancreatic fluid, increased over prior. Fluid extends throughout the retroperitoneum and into the pelvis. No evidence for pseudocyst. There continues to be a small area of low-attenuation along the posterior aspect of pancreas measuring 10 mm and stable in appearance. Findings are consistent with focal necrosis, focal pancreatitis, or pancreatic cysts. Small pseudocyst within the pancreatic parenchyma  is possible but less likely. Spleen: Normal in appearance. Renal/Adrenal: Adrenal glands have a normal appearance. No hydronephrosis or focal renal mass. There is enhancement of the right ureteral  wall, likely secondary to pancreatic exudates. Gastrointestinal tract: Stomach is normal in appearance. The appendix is well seen and has a normal appearance. Small bowel loops have a normal appearance. There is mild thickening of the descending colon wall, likely secondary to pancreatic exudates. Reproductive/Pelvis: The uterus is present.  No adnexal mass. Vascular/Lymphatic: No evidence for aortic aneurysm. No retroperitoneal or mesenteric adenopathy. Musculoskeletal/Abdominal wall: Abdominal wall is unremarkable. Visualized osseous structures have a normal appearance. Other: None IMPRESSION: 1. Increased pancreatic exudates in the retroperitoneum and peritoneal cavity. 2. Stable appearance of small low-attenuation lesion within the posterior aspect of pancreas, consistent with small area of focal pancreatitis, focal necrosis, small cyst, or pseudocyst. 3. Enhancement of the right ureteral wall, likely secondary to pancreatic exudates. 4. Mild thickening of the descending colon, likely secondary to pancreatitis. 5. Increased bilateral pleural effusions and bibasilar atelectasis. 6. Sludge versus contrast within the gallbladder. Electronically Signed   By: Norva Pavlov M.D.   On: 10/03/2015 18:07   Prior to Admission medications   Medication Sig Start Date End Date Taking? Authorizing Provider  albuterol (PROVENTIL HFA;VENTOLIN HFA) 108 (90 Base) MCG/ACT inhaler Inhale 2 puffs into the lungs every 6 (six) hours as needed for wheezing or shortness of breath. 03/26/15  Yes Corwin Levins, MD  ALPRAZolam Prudy Feeler) 0.25 MG tablet Take 1 tablet (0.25 mg total) by mouth 2 (two) times daily as needed for anxiety. 03/26/15  Yes Corwin Levins, MD  amphetamine-dextroamphetamine (ADDERALL) 10 MG tablet Take 1 tablet (10 mg total) by mouth 2 (two) times daily. 07/16/15  Yes Corwin Levins, MD  budesonide-formoterol [facility]) 160-4.5 MCG/ACT inhaler Inhale 2 puffs into the lungs 2 (two) times daily. For Shortness of  breath Patient taking differently: Inhale 2 puffs into the lungs 2 (two) times daily as needed (For shortness of breath.).  06/04/13  Yes Corwin Levins, MD  dicyclomine (BENTYL) 10 MG capsule Take 1 capsule (10 mg total) by mouth 3 (three) times daily before meals. 04/02/15  Yes Meryl Dare, MD  escitalopram (LEXAPRO) 10 MG tablet Take 1 tablet (10 mg total) by mouth daily. 03/26/15 09/30/15 Yes Corwin Levins, MD  ibuprofen (ADVIL,MOTRIN) 200 MG tablet Take 400 mg by mouth every 6 (six) hours as needed (For cramping.). Reported on 03/26/2015   Yes Historical Provider, MD  metoprolol succinate (TOPROL-XL) 50 MG 24 hr tablet TAKE ONE TABLET BY MOUTH EVERY MORNING. TAKE WITH OR IMMEDIATELY FOLLOWING A MEAL Patient taking differently: TAKE 50 MG  BY MOUTH EVERY MORNING. TAKE WITH OR IMMEDIATELY FOLLOWING A MEAL 04/23/15  Yes Corwin Levins, MD  ondansetron [facility]) 4 MG tablet Take 1 tablet (4 mg total) by mouth 3 (three) times daily as needed for nausea or vomiting. 04/02/15  Yes Meryl Dare, MD  pantoprazole (PROTONIX) 40 MG tablet Take 1 tablet (40 mg total) by mouth 2 (two) times daily. 04/02/15  Yes Meryl Dare, MD  thiamine 100 MG tablet Take 100 mg by mouth daily.   Yes Historical Provider, MD     Medications: . enoxaparin (LOVENOX) injection  40 mg Subcutaneous QHS  . escitalopram  10 mg Oral Daily  . fenofibrate  160 mg Oral Daily  . furosemide  20 mg Intravenous Once  . imipenem-cilastatin  500 mg Intravenous Q6H  . mometasone-formoterol  2 puff Inhalation BID  . ondansetron (ZOFRAN) IV  4 mg Intravenous Q6H  . pantoprazole  40 mg Oral BID  . thiamine  100 mg Oral Daily   . sodium chloride 50 mL/hr at 10/05/15 0600  . dextrose 75 mL/hr at 10/05/15 0600     Hypotension Hx of anxiety/depression Asthma  Assessment/Plan Pancreatitis Hypertriglyceridemia >5000 Elevated LFT's Thrombocytopenia  FEN:  NPO/IV fluids ID:  Imipenem-cilastatin & Vancomycin day 4 DVT:  Lovenox   Plan:  Ongoing medical management. I explained currently there is no current need for surgical intervention, she will be seen by Dr. Johna SheriffHoxworth later today, along with the Medicine Service.  LOS: 5 days    Kandice Schmelter 10/05/2015 [phone]

## 2015-10-05 NOTE — Progress Notes (Signed)
PROGRESS NOTE    Aimee Thompson  ZOX:[id number] DOB: [birth year] DOA: 09/30/2015  PCP: Oliver Barre, MD   Brief Narrative:  45 y/o with asthma, HTN presenting with abdominal pain and vomiting. Found to have acute pancreatitis per labs and CT scan. Has triglyceride level > 5000. No h/o of elevated Triglycerides in the past. She has been having issues with abdominal discomfort for about 6-7 months now. Was being treated with a PPI and Bentyl.  Subjective: Zofran helping to control vomiting. Abdominal discomfort continues but is not severe. No BM. Is passing gas. Is ambulating in hall.   Assessment & Plan:   Principal Problem:   Acute pancreatitis/ hypertriglyceridemia   - has been having abdominal discomfort for many months as noted in narrative above - EGD in 2/17- no signs of inflammation - takes PPI and Bentyl at home per GI and PCP - Triglyceride level > 5000 on admission - last level in 2012 was 93 and in 2008 was 80 - hypertriglyceridemia treated with insulin infusion with D10>>  Triglycerides normalized  - Lipase improved significantly- stopped insulin 7/15 - started Tricor 160 mg daily - NPO, cont pain control, IVF, Lasix PRN, follow I and O carefully - repeat CT shows increased fluid collections without obvious pseudocysts and a small amount of necrosis of the pancrease- have asked for Gen surgery assistance on helping to decide about nurtition  Fevers -  Given Levaquin/ Flagyl on admission. I stopped them the next day as she had no signs of infection.  - Began to have fevers on 7/13. Fever max 103 on 7/14- placed on Primaxin and Vanc- no recurrent fever- UA negative- no respiratory symptoms - blood cultures from 7/12 and 7/14 negative - MRSA PCR negative - CXR and UA negative - d/c Vanc 7/16- cont Primaxin for pancreatic necrosis  Hypoglycemia - cont D 10 as sugars are in low 100s on 75 cc  Hyponatremia - unfortunately was due D10- started Lasix to expel free water-  hyponatremia resolved - following I and O carefully   Anemia - Hb 15.8 >>>9.6 - baseline is 14-15 - check anemia panel in AM- likely is anemia of acute illness and dilutional  Thrombocytopenia - plt 241>> 111- follow- cont Lovenox for now  Leukopenia - follow  Mildly elevated LFTs - likely secondary to above- T bili still elevated- AST and ALT normalized  Lactic acidosis/ dehydration - resolved  Hypotension with history of Essential hypertension, benign - Metoprolol on hold for now  Hypokalemia/ hypomagnesemia - replacing  Asthma - no flare at this time - albuterol, Symbicort  Depression/ anxiety - Lexapro and Xanax  ADD - Adderall     DVT prophylaxis: Lovenox Code Status: full code Family Communication: parents, sister Disposition Plan: follow in SDU Consultants:   PCCM Procedures:   none Antimicrobials:  Anti-infectives    Start     Dose/Rate Route Frequency Ordered Stop   10/02/15 2200  vancomycin (VANCOCIN) IVPB 750 mg/150 ml premix  Status:  Discontinued     750 mg 150 mL/hr over 60 Minutes Intravenous Every 12 hours 10/02/15 1928 10/04/15 1350   10/02/15 2000  imipenem-cilastatin (PRIMAXIN) 500 mg in sodium chloride 0.9 % 100 mL IVPB     500 mg 200 mL/hr over 30 Minutes Intravenous Every 6 hours 10/02/15 1925     10/01/15 2200  levofloxacin (LEVAQUIN) IVPB 750 mg  Status:  Discontinued     750 mg 100 mL/hr over 90 Minutes Intravenous Every 24 hours 09/30/15 2206  10/01/15 0915   10/01/15 0600  metroNIDAZOLE (FLAGYL) IVPB 500 mg  Status:  Discontinued     500 mg 100 mL/hr over 60 Minutes Intravenous Every 6 hours 09/30/15 2206 10/01/15 0915   09/30/15 2200  levofloxacin (LEVAQUIN) IVPB 750 mg     750 mg 100 mL/hr over 90 Minutes Intravenous  Once 09/30/15 2152 10/01/15 0018   09/30/15 2200  metroNIDAZOLE (FLAGYL) IVPB 500 mg     500 mg 100 mL/hr over 60 Minutes Intravenous  Once 09/30/15 2152 10/01/15 0127       Objective: Filed Vitals:    10/05/15 0500 10/05/15 0635 10/05/15 0748 10/05/15 0800  BP:  126/73    Pulse:  76    Temp:    98.9 F (37.2 C)  TempSrc:    Oral  Resp:  18    Height:      Weight: 70.9 kg (156 lb 4.9 oz)     SpO2:  94% 94%     Intake/Output Summary (Last 24 hours) at 10/05/15 1209 Last data filed at 10/05/15 6160  Gross per 24 hour  Intake   3445 ml  Output   5000 ml  Net  -1555 ml   Filed Weights   10/03/15 1400 10/04/15 0700 10/05/15 0500  Weight: 68.1 kg (150 lb 2.1 oz) 70.9 kg (156 lb 4.9 oz) 70.9 kg (156 lb 4.9 oz)    Examination: General exam: Appears comfortable  HEENT: PERRLA, oral mucosa moist, no sclera icterus or thrush Respiratory system: Clear to auscultation. Respiratory effort normal. Cardiovascular system: S1 & S2 heard, RRR.  No murmurs  Gastrointestinal system: Abdomen soft,  Tender in periumbilical area,  Distended , active bowel sounds. No organomegaly Central nervous system: Alert and oriented. No focal neurological deficits. Extremities: No cyanosis, clubbing or edema Skin: No rashes or ulcers Psychiatry:  Mood & affect appropriate.     Data Reviewed: I have personally reviewed following labs and imaging studies  CBC:  Recent Labs Lab 09/30/15 2230 10/02/15 1655 10/03/15 0316 10/05/15 0313  WBC 10.6* 5.7 4.3 3.5*  NEUTROABS 8.7*  --   --   --   HGB 15.8* 12.1 10.6* 9.6*  HCT 44.4 35.1* 29.6* 27.9*  MCV 94.9 97.0 94.3 100.0  PLT 241 147* 126* 111*   Basic Metabolic Panel:  Recent Labs Lab 10/02/15 1655 10/03/15 0316 10/03/15 1633 10/04/15 0321 10/05/15 0313  NA 131* 137 136 137 138  K 2.7* 3.0* 3.6 3.0* 3.1*  CL 94* 103 102 101 102  CO2 30 31 28 31  32  GLUCOSE 76 45* 125* 142* 130*  BUN <5* <5* <5* <5* <5*  CREATININE 0.59 0.50 0.56 0.54 0.47  CALCIUM 6.5* 6.8* 7.8* 7.9* 7.8*  MG  --   --   --  1.6*  --    GFR: Estimated Creatinine Clearance: 81.9 mL/min (by C-G formula based on Cr of 0.47). Liver Function Tests:  Recent Labs Lab  09/30/15 1705 10/03/15 1633 10/04/15 0321  AST 241* 33 25  ALT 182* 45 40  ALKPHOS 83 59 54  BILITOT 1.4* 1.5* 1.7*  PROT 6.5 5.7* 5.5*  ALBUMIN 3.6 2.7* 2.5*    Recent Labs Lab 09/30/15 1705 10/01/15 0800 10/02/15 0315 10/03/15 0316  LIPASE 655* 1188* 400* 187*   No results for input(s): AMMONIA in the last 168 hours. Coagulation Profile: No results for input(s): INR, PROTIME in the last 168 hours. Cardiac Enzymes: No results for input(s): CKTOTAL, CKMB, CKMBINDEX, TROPONINI in the last 168  hours. BNP (last 3 results) No results for input(s): PROBNP in the last 8760 hours. HbA1C: No results for input(s): HGBA1C in the last 72 hours. CBG:  Recent Labs Lab 10/03/15 1848 10/04/15 0413 10/04/15 1018 10/04/15 1829 10/05/15 0549  GLUCAP 119* 142* 169* 125* 127*   Lipid Profile:  Recent Labs  10/03/15 1633 10/04/15 0321  TRIG 91 124   Thyroid Function Tests:  Recent Labs  10/02/15 1655  TSH 0.923  FREET4 0.86   Anemia Panel: No results for input(s): VITAMINB12, FOLATE, FERRITIN, TIBC, IRON, RETICCTPCT in the last 72 hours. Urine analysis:    Component Value Date/Time   COLORURINE AMBER* 09/30/2015 1823   APPEARANCEUR CLOUDY* 09/30/2015 1823   LABSPEC 1.028 09/30/2015 1823   PHURINE 7.0 09/30/2015 1823   GLUCOSEU NEGATIVE 09/30/2015 1823   GLUCOSEU NEGATIVE 03/26/2015 1704   HGBUR MODERATE* 09/30/2015 1823   BILIRUBINUR NEGATIVE 09/30/2015 1823   KETONESUR NEGATIVE 09/30/2015 1823   PROTEINUR NEGATIVE 09/30/2015 1823   UROBILINOGEN 0.2 03/26/2015 1704   NITRITE NEGATIVE 09/30/2015 1823   LEUKOCYTESUR SMALL* 09/30/2015 1823   Sepsis Labs: @LABRCNTIP (procalcitonin:4,lacticidven:4) ) Recent Results (from the past 240 hour(s))  Blood culture (routine x 2)     Status: None (Preliminary result)   Collection Time: 09/30/15  9:29 PM  Result Value Ref Range Status   Specimen Description RIGHT ANTECUBITAL  Final   Special Requests BOTTLES DRAWN  AEROBIC AND ANAEROBIC 5CC  Final   Culture   Final    NO GROWTH 4 DAYS Performed at [facility]    Report Status PENDING  Incomplete  Blood culture (routine x 2)     Status: None (Preliminary result)   Collection Time: 09/30/15 10:30 PM  Result Value Ref Range Status   Specimen Description BLOOD LEFT ANTECUBITAL  Final   Special Requests BOTTLES DRAWN AEROBIC AND ANAEROBIC 5CC EACH  Final   Culture   Final    NO GROWTH 4 DAYS Performed at [facility]    Report Status PENDING  Incomplete  MRSA PCR Screening     Status: None   Collection Time: 10/01/15 12:48 AM  Result Value Ref Range Status   MRSA by PCR NEGATIVE NEGATIVE Final    Comment:        The GeneXpert MRSA Assay (FDA approved for NASAL specimens only), is one component of a comprehensive MRSA colonization surveillance program. It is not intended to diagnose MRSA infection nor to guide or monitor treatment for MRSA infections.   Culture, blood (routine x 2)     Status: None (Preliminary result)   Collection Time: 10/02/15  7:10 PM  Result Value Ref Range Status   Specimen Description BLOOD LEFT ARM  Final   Special Requests IN PEDIATRIC BOTTLE 4CC  Final   Culture   Final    NO GROWTH 2 DAYS Performed at [facility]    Report Status PENDING  Incomplete  Culture, blood (routine x 2)     Status: None (Preliminary result)   Collection Time: 10/02/15  7:18 PM  Result Value Ref Range Status   Specimen Description BLOOD RIGHT ARM  Final   Special Requests BOTTLES DRAWN AEROBIC AND ANAEROBIC 5CC  Final   Culture   Final    NO GROWTH 2 DAYS Performed at [facility]    Report Status PENDING  Incomplete         Radiology Studies: Ct Abdomen Pelvis W Contrast  10/03/2015  CLINICAL DATA:  HTN presenting  with abdominal pain and vomiting. Found to have acute pancreatitis per labs and CT scan. Has triglyceride level > 5000. No h/o of elevated Triglycerides in the past. She has been  having issues with abdominal discomfort for 6-7 months. Epigastric pain. Nausea, vomiting. Alcohol dependency. EXAM: CT ABDOMEN AND PELVIS WITH CONTRAST TECHNIQUE: Multidetector CT imaging of the abdomen and pelvis was performed using the standard protocol following bolus administration of intravenous contrast. CONTRAST:  100mL ISOVUE-300 IOPAMIDOL (ISOVUE-300) INJECTION 61% COMPARISON:  09/30/2015 and 07/06/2005 FINDINGS: Lower chest: Increased bilateral pleural effusions associated with bibasilar atelectasis, right greater than left. Heart size is normal. No imaged pericardial effusion or significant coronary artery calcifications. Hepatobiliary: No focal hepatic mass identified. Sludge versus contrast within the gallbladder. Pancreas: Significant peripancreatic fluid, increased over prior. Fluid extends throughout the retroperitoneum and into the pelvis. No evidence for pseudocyst. There continues to be a small area of low-attenuation along the posterior aspect of pancreas measuring 10 mm and stable in appearance. Findings are consistent with focal necrosis, focal pancreatitis, or pancreatic cysts. Small pseudocyst within the pancreatic parenchyma is possible but less likely. Spleen: Normal in appearance. Renal/Adrenal: Adrenal glands have a normal appearance. No hydronephrosis or focal renal mass. There is enhancement of the right ureteral wall, likely secondary to pancreatic exudates. Gastrointestinal tract: Stomach is normal in appearance. The appendix is well seen and has a normal appearance. Small bowel loops have a normal appearance. There is mild thickening of the descending colon wall, likely secondary to pancreatic exudates. Reproductive/Pelvis: The uterus is present.  No adnexal mass. Vascular/Lymphatic: No evidence for aortic aneurysm. No retroperitoneal or mesenteric adenopathy. Musculoskeletal/Abdominal wall: Abdominal wall is unremarkable. Visualized osseous structures have a normal appearance.  Other: None IMPRESSION: 1. Increased pancreatic exudates in the retroperitoneum and peritoneal cavity. 2. Stable appearance of small low-attenuation lesion within the posterior aspect of pancreas, consistent with small area of focal pancreatitis, focal necrosis, small cyst, or pseudocyst. 3. Enhancement of the right ureteral wall, likely secondary to pancreatic exudates. 4. Mild thickening of the descending colon, likely secondary to pancreatitis. 5. Increased bilateral pleural effusions and bibasilar atelectasis. 6. Sludge versus contrast within the gallbladder. Electronically Signed   By: Norva PavlovElizabeth  Brown M.D.   On: 10/03/2015 18:07      Scheduled Meds: . enoxaparin (LOVENOX) injection  40 mg Subcutaneous QHS  . escitalopram  10 mg Oral Daily  . fenofibrate  160 mg Oral Daily  . furosemide  20 mg Intravenous Once  . imipenem-cilastatin  500 mg Intravenous Q6H  . mometasone-formoterol  2 puff Inhalation BID  . ondansetron (ZOFRAN) IV  4 mg Intravenous Q6H  . pantoprazole  40 mg Oral BID  . potassium chloride  10 mEq Intravenous Q1 Hr x 4  . thiamine  100 mg Oral Daily   Continuous Infusions: . sodium chloride 50 mL/hr at 10/05/15 0600  . dextrose 75 mL/hr at 10/05/15 0756     LOS: 5 days    Time spent in minutes: 35    Nicco Reaume, MD [facility] Pager: [email or web address] Password [email or web address] 10/05/2015, 12:09 PM

## 2015-10-06 DIAGNOSIS — K8591 Acute pancreatitis with uninfected necrosis, unspecified: Secondary | ICD-10-CM | POA: Insufficient documentation

## 2015-10-06 LAB — IRON AND TIBC
IRON: 48 ug/dL (ref 28–170)
Saturation Ratios: 24 % (ref 10.4–31.8)
TIBC: 197 ug/dL — AB (ref 250–450)
UIBC: 149 ug/dL

## 2015-10-06 LAB — COMPREHENSIVE METABOLIC PANEL
ALBUMIN: 2.6 g/dL — AB (ref 3.5–5.0)
ALK PHOS: 58 U/L (ref 38–126)
ALT: 29 U/L (ref 14–54)
AST: 20 U/L (ref 15–41)
Anion gap: 5 (ref 5–15)
BILIRUBIN TOTAL: 1 mg/dL (ref 0.3–1.2)
CO2: 29 mmol/L (ref 22–32)
Calcium: 8 mg/dL — ABNORMAL LOW (ref 8.9–10.3)
Chloride: 103 mmol/L (ref 101–111)
Creatinine, Ser: 0.42 mg/dL — ABNORMAL LOW (ref 0.44–1.00)
GFR calc Af Amer: >60 mL/min (ref >60)
GFR calc non Af Amer: >60 mL/min (ref >60)
GLUCOSE: 147 mg/dL — AB (ref 65–99)
POTASSIUM: 3.2 mmol/L — AB (ref 3.5–5.1)
SODIUM: 137 mmol/L (ref 135–145)
TOTAL PROTEIN: 6 g/dL — AB (ref 6.5–8.1)

## 2015-10-06 LAB — CBC
HEMATOCRIT: 31.1 % — AB (ref 36.0–46.0)
Hemoglobin: 10.4 g/dL — ABNORMAL LOW (ref 12.0–15.0)
MCH: 33.8 pg (ref 26.0–34.0)
MCHC: 33.4 g/dL (ref 30.0–36.0)
MCV: 101 fL — ABNORMAL HIGH (ref 78.0–100.0)
Platelets: 116 10*3/uL — ABNORMAL LOW (ref 150–400)
RBC: 3.08 MIL/uL — ABNORMAL LOW (ref 3.87–5.11)
RDW: 13.4 % (ref 11.5–15.5)
WBC: 3.1 10*3/uL — ABNORMAL LOW (ref 4.0–10.5)

## 2015-10-06 LAB — RETICULOCYTES
RBC.: 3.08 MIL/uL — ABNORMAL LOW (ref 3.87–5.11)
Retic Count, Absolute: 55.4 10*3/uL (ref 19.0–186.0)
Retic Ct Pct: 1.8 % (ref 0.4–3.1)

## 2015-10-06 LAB — TRIGLYCERIDES: Triglycerides: 92 mg/dL (ref <150)

## 2015-10-06 LAB — GLUCOSE, CAPILLARY
GLUCOSE-CAPILLARY: 140 mg/dL — AB (ref 65–99)
GLUCOSE-CAPILLARY: 139 mg/dL — AB (ref 65–99)
Glucose-Capillary: 147 mg/dL — ABNORMAL HIGH (ref 65–99)
Glucose-Capillary: 151 mg/dL — ABNORMAL HIGH (ref 65–99)
Glucose-Capillary: 151 mg/dL — ABNORMAL HIGH (ref 65–99)

## 2015-10-06 LAB — FERRITIN: Ferritin: 760 ng/mL — ABNORMAL HIGH (ref 11–307)

## 2015-10-06 LAB — LIPASE, BLOOD: Lipase: 261 U/L — ABNORMAL HIGH (ref 11–51)

## 2015-10-06 LAB — VITAMIN B12: VITAMIN B 12: 2329 pg/mL — AB (ref 180–914)

## 2015-10-06 LAB — FOLATE: Folate: 15 ng/mL (ref >5.9)

## 2015-10-06 LAB — MAGNESIUM: MAGNESIUM: 2.1 mg/dL (ref 1.7–2.4)

## 2015-10-06 MED ORDER — POTASSIUM CHLORIDE CRYS ER 20 MEQ PO TBCR
40.0000 meq | EXTENDED_RELEASE_TABLET | ORAL | Status: AC
  Administered 2015-10-06 (×2): 40 meq via ORAL
  Filled 2015-10-06 (×2): qty 2

## 2015-10-06 MED ORDER — BOOST / RESOURCE BREEZE PO LIQD
1.0000 | Freq: Two times a day (BID) | ORAL | Status: DC
  Administered 2015-10-06 – 2015-10-08 (×3): 1 via ORAL

## 2015-10-06 NOTE — Progress Notes (Signed)
Initial Nutrition Assessment  DOCUMENTATION CODES:   Not applicable  INTERVENTION:  - Will order Boost Breeze BID, each supplement provides 250 kcal and 9 grams of protein - Diet advancement as medically feasible. - RD will continue to monitor for additional needs prior to d/c.  NUTRITION DIAGNOSIS:   Inadequate oral intake related to acute illness as evidenced by meal completion < 25%.  GOAL:   Patient will meet greater than or equal to 90% of their needs  MONITOR:   PO intake, Supplement acceptance, Weight trends, Labs, I & O's  REASON FOR ASSESSMENT:   Consult Assessment of nutrition requirement/status  ASSESSMENT:   45 y/o with asthma, HTN presenting with abdominal pain and vomiting. Found to have acute pancreatitis per labs and CT scan. Has triglyceride level > 5000. No h/o of elevated Triglycerides in the past. She has been having issues with abdominal discomfort for about 6-7 months now. Was being treated with a PPI and Bentyl.  Pt seen for consult concerning low-fat diet d/t dx of acute pancreatitis. BMI indicates overweight status. Pt was admitted on 09/30/15 and was NPO from that time until 10/05/15 at ~1545 when diet was advanced to FLD, low-fat. Pt reports she had a few bites of cream of potato soup, jello, and pudding for dinner which caused moderate cramping and discomfort. For breakfast she had grits and another item and had mild cramping. For lunch today she consumed a few bites of tomato soup and Italian ice and again experienced mild cramping. Pt states that abdominal pain began 6-7 months PTA and would "ebb and flow" in severity; she is unsure if any foods made this sensation worse. She was having nausea and vomiting PTA associated with abdominal pain and would have vomiting even without PO intake.   Spoke with pt and partner, who is at bedside. Both report that most meals are eaten at home and that they enjoy cooking with fresh ingredients. Talked with pt about  current diet order and foods to avoid. Also talked about low-fat diet when diet is able to be advanced to include more solid foods. Provided her and partner with Pancreatitis Nutrition Therapy handout from [employer] website and reviewed this in detail. Encouraged pt to limit fat intake to 47-57 grams/day (25-30% kcal from fat per guidelines).   Pt and partner are interested in knowing how long a low-fat diet needs to be followed; encouraged them to talk with MD about this as it is outside of RD scope of practice (based on lab trends, diminishing inflammation). Pt reports this is her first episode of pancreatitis.   Physical assessment shows no muscle or fat wasting. Weight has remained stable/slight trend up during the time frame PTA of abdominal discomfort. Will order Boost Breeze to supplement protein intake while ability to consume adequate PO nutrition resumes.   Medications reviewed; Labs reviewed; CBG: 140 mg/dL this Am, K: 3.2 mmol/L, BUN <5 mg/dL, craetinine: 1.61 mg/dL, Ca: 8 mg/dL, lipase: 096 units/L, triglycerides WDL at 92 mg/dL. IVF: NS @ 50 mL/hr.   Diet Order:  Diet full liquid Room service appropriate?: Yes; Fluid consistency:: Thin  Skin:  Reviewed, no issues  Last BM:  7/11  Height:   Ht Readings from Last 1 Encounters:  09/30/15  (1.575 m)    Weight:   Wt Readings from Last 1 Encounters:  10/06/15 148 lb 13 oz (67.5 kg)    Ideal Body Weight:  50 kg (kg)  BMI:  Body  mass index is 27.21 kg/(m^2).  Estimated Nutritional Needs:   Kcal:  1700-1900  Protein:  81-101 grams (1.2-1.5 grams/kg)  Fluid:  2 L/day  EDUCATION NEEDS:   Education needs addressed     Trenton GammonJessica Obie Kallenbach, MS, RD, LDN Inpatient Clinical Dietitian Pager # [phone] After hours/weekend pager # [phone]

## 2015-10-06 NOTE — Progress Notes (Signed)
PROGRESS NOTE    Aimee Thompson  ZOX:[id number] DOB: 1970-08-05 DOA: 09/30/2015  PCP: Oliver Barre, MD   Brief Narrative:  45 y/o with asthma, HTN presenting with abdominal pain and vomiting. Found to have acute pancreatitis per labs and CT scan. Has triglyceride level > 5000. No h/o of elevated Triglycerides in the past. She has been having issues with abdominal discomfort for about 6-7 months now. Was being treated with a PPI and Bentyl as outpt.  She was treated for acute pancreatitis secondary to hypertriglyceridema with Insulin and dextrose infusions. Levels of Triglycerides and lipase improved. Developed fevers on 7/13. Fever spike on 7/14. Antibiotics started. CT showed possibly pancreatic necrosis with fluid collections but no discrete pseudocyst. Gen surgery following- started on full liquids yesterday. Have not been able to wean off D10 as sugars in low 100s still. She is keeping fluids down and ambulating in the hall but is requiring QID Zofran routine. Can transfer to med/surg today.   Subjective: Zofran helping to control vomiting. Abdominal discomfort continues but is not severe. No BM. Is passing gas. Is ambulating in hall.   Assessment & Plan:   Principal Problem:   Acute pancreatitis/ hypertriglyceridemia   - has been having abdominal discomfort for many months as noted in narrative above - EGD in 2/17- no signs of inflammation - takes PPI and Bentyl at home per GI and PCP - Triglyceride level > 5000 on admission - last level in 2012 was 93 and in 2008 was 80 - hypertriglyceridemia treated with insulin infusion and D10>>  Triglycerides normalized  - Lipase improved significantly - stopped insulin 7/15 - started Tricor 160 mg daily - NPO, cont pain control, IVF, Lasix PRN, follow I and O carefully - repeat CT shows increased fluid collections without obvious pseudocysts and a small amount of necrosis of the pancrease - have asked for Gen surgery assistance on helping to  decide about nurtition- started full liquids yesterday  Fevers -  Given Levaquin/ Flagyl on admission. I stopped them the next day as she had no signs of infection.  - Began to have fevers on 7/13. Fever max 103 on 7/14- placed on Primaxin and Vanc- no recurrent fever- CT showed possible pancreatic necrosis - I feel this is real- fevers stopped abruptly after first doses of antibiotics - UA negative- no respiratory symptoms - blood cultures from 7/12 and 7/14 negative - MRSA PCR negative - CXR and UA negative - d/c Vanc 7/16- cont Primaxin for pancreatic necrosis  Hypoglycemia - developed hypoglycemia on 7/15 AM although there was no change in D10 or Insulin rates- Insulin was stopped as Triglycerides normalized but D10 was continued - cont D 10 as sugars are in low 100s on 75 cc  Hyponatremia - unfortunately was due D10- given Lasix to expel free water- hyponatremia resolved - following I and O carefully and give Lasix as needed  - negative balance 1200 cc  Hypokalemia/ hypomagnesemia - replacing  Anemia - Hb 15.8 >>>9.6 - baseline is 14-15 - anemia panel in AM shows AOCD-acute drop is likely anemia of acute illness and dilutional  Thrombocytopenia - plt 241>> 111-due to sepsis? -  follow- cont Lovenox for now  Leukopenia -mild- follow  Mildly elevated LFTs - likely secondary to above-   normalized  Lactic acidosis/ dehydration on admission - resolved with aggressive hydration  Hypotension with history of Essential hypertension, benign - Metoprolol on hold for now  Asthma - no flare at this time - albuterol, Symbicort  Depression/ anxiety - Lexapro and Xanax  ADD - Adderall     DVT prophylaxis: Lovenox Code Status: full code Family Communication: parents, sister Disposition Plan: follow in SDU Consultants:   PCCM Procedures:   none Antimicrobials:  Anti-infectives    Start     Dose/Rate Route Frequency Ordered Stop   10/02/15 2200  vancomycin  (VANCOCIN) IVPB 750 mg/150 ml premix  Status:  Discontinued     750 mg 150 mL/hr over 60 Minutes Intravenous Every 12 hours 10/02/15 1928 10/04/15 1350   10/02/15 2000  imipenem-cilastatin (PRIMAXIN) 500 mg in sodium chloride 0.9 % 100 mL IVPB     500 mg 200 mL/hr over 30 Minutes Intravenous Every 6 hours 10/02/15 1925     10/01/15 2200  levofloxacin (LEVAQUIN) IVPB 750 mg  Status:  Discontinued     750 mg 100 mL/hr over 90 Minutes Intravenous Every 24 hours 09/30/15 2206 10/01/15 0915   10/01/15 0600  metroNIDAZOLE (FLAGYL) IVPB 500 mg  Status:  Discontinued     500 mg 100 mL/hr over 60 Minutes Intravenous Every 6 hours 09/30/15 2206 10/01/15 0915   09/30/15 2200  levofloxacin (LEVAQUIN) IVPB 750 mg     750 mg 100 mL/hr over 90 Minutes Intravenous  Once 09/30/15 2152 10/01/15 0018   09/30/15 2200  metroNIDAZOLE (FLAGYL) IVPB 500 mg     500 mg 100 mL/hr over 60 Minutes Intravenous  Once 09/30/15 2152 10/01/15 0127       Objective: Filed Vitals:   10/06/15 0730 10/06/15 0735 10/06/15 0800 10/06/15 1200  BP: 153/86     Pulse: 67     Temp:   98 F (36.7 C) 98.6 F (37 C)  TempSrc:   Oral Oral  Resp: 14     Height:      Weight:      SpO2: 97% 96%      Intake/Output Summary (Last 24 hours) at 10/06/15 1331 Last data filed at 10/06/15 1200  Gross per 24 hour  Intake   3075 ml  Output   4850 ml  Net  -1775 ml   Filed Weights   10/04/15 0700 10/05/15 0500 10/06/15 0500  Weight: 70.9 kg (156 lb 4.9 oz) 70.9 kg (156 lb 4.9 oz) 67.5 kg (148 lb 13 oz)    Examination: General exam: Appears comfortable  HEENT: PERRLA, oral mucosa moist, no sclera icterus or thrush Respiratory system: Clear to auscultation. Respiratory effort normal. Cardiovascular system: S1 & S2 heard, RRR.  No murmurs  Gastrointestinal system: Abdomen soft,  Tender in periumbilical area,  Distended , active bowel sounds. No organomegaly Central nervous system: Alert and oriented. No focal neurological  deficits. Extremities: No cyanosis, clubbing or edema Skin: No rashes or ulcers Psychiatry:  Mood & affect appropriate.     Data Reviewed: I have personally reviewed following labs and imaging studies  CBC:  Recent Labs Lab 09/30/15 2230 10/02/15 1655 10/03/15 0316 10/05/15 0313 10/06/15 0314  WBC 10.6* 5.7 4.3 3.5* 3.1*  NEUTROABS 8.7*  --   --   --   --   HGB 15.8* 12.1 10.6* 9.6* 10.4*  HCT 44.4 35.1* 29.6* 27.9* 31.1*  MCV 94.9 97.0 94.3 100.0 101.0*  PLT 241 147* 126* 111* 116*   Basic Metabolic Panel:  Recent Labs Lab 10/03/15 0316 10/03/15 1633 10/04/15 0321 10/05/15 0313 10/06/15 0314  NA 137 136 137 138 137  K 3.0* 3.6 3.0* 3.1* 3.2*  CL 103 102 101 102 103  CO2 31 28  31 32 29  GLUCOSE 45* 125* 142* 130* 147*  BUN <5* <5* <5* <5* <5*  CREATININE 0.50 0.56 0.54 0.47 0.42*  CALCIUM 6.8* 7.8* 7.9* 7.8* 8.0*  MG  --   --  1.6*  --  2.1   GFR: Estimated Creatinine Clearance: 80 mL/min (by C-G formula based on Cr of 0.42). Liver Function Tests:  Recent Labs Lab 09/30/15 1705 10/03/15 1633 10/04/15 0321 10/06/15 0314  AST 241* 33 25 20  ALT 182* 45 40 29  ALKPHOS 83 59 54 58  BILITOT 1.4* 1.5* 1.7* 1.0  PROT 6.5 5.7* 5.5* 6.0*  ALBUMIN 3.6 2.7* 2.5* 2.6*    Recent Labs Lab 09/30/15 1705 10/01/15 0800 10/02/15 0315 10/03/15 0316 10/06/15 0314  LIPASE 655* 1188* 400* 187* 261*   No results for input(s): AMMONIA in the last 168 hours. Coagulation Profile: No results for input(s): INR, PROTIME in the last 168 hours. Cardiac Enzymes: No results for input(s): CKTOTAL, CKMB, CKMBINDEX, TROPONINI in the last 168 hours. BNP (last 3 results) No results for input(s): PROBNP in the last 8760 hours. HbA1C: No results for input(s): HGBA1C in the last 72 hours. CBG:  Recent Labs Lab 10/05/15 1222 10/05/15 1831 10/05/15 2331 10/06/15 0617 10/06/15 1226  GLUCAP 173* 157* 151* 140* 151*   Lipid Profile:  Recent Labs  10/04/15 0321  10/06/15 0314  TRIG 124 92   Thyroid Function Tests: No results for input(s): TSH, T4TOTAL, FREET4, T3FREE, THYROIDAB in the last 72 hours. Anemia Panel:  Recent Labs  10/06/15 0314  VITAMINB12 2329*  FOLATE 15.0  FERRITIN 760*  TIBC 197*  IRON 48  RETICCTPCT 1.8   Urine analysis:    Component Value Date/Time   COLORURINE AMBER* 09/30/2015 1823   APPEARANCEUR CLOUDY* 09/30/2015 1823   LABSPEC 1.028 09/30/2015 1823   PHURINE 7.0 09/30/2015 1823   GLUCOSEU NEGATIVE 09/30/2015 1823   GLUCOSEU NEGATIVE 03/26/2015 1704   HGBUR MODERATE* 09/30/2015 1823   BILIRUBINUR NEGATIVE 09/30/2015 1823   KETONESUR NEGATIVE 09/30/2015 1823   PROTEINUR NEGATIVE 09/30/2015 1823   UROBILINOGEN 0.2 03/26/2015 1704   NITRITE NEGATIVE 09/30/2015 1823   LEUKOCYTESUR SMALL* 09/30/2015 1823   Sepsis Labs: @LABRCNTIP (procalcitonin:4,lacticidven:4) ) Recent Results (from the past 240 hour(s))  Blood culture (routine x 2)     Status: None   Collection Time: 09/30/15  9:29 PM  Result Value Ref Range Status   Specimen Description RIGHT ANTECUBITAL  Final   Special Requests BOTTLES DRAWN AEROBIC AND ANAEROBIC 5CC  Final   Culture   Final    NO GROWTH 5 DAYS Performed at [facility]    Report Status 10/05/2015 FINAL  Final  Blood culture (routine x 2)     Status: None   Collection Time: 09/30/15 10:30 PM  Result Value Ref Range Status   Specimen Description BLOOD LEFT ANTECUBITAL  Final   Special Requests BOTTLES DRAWN AEROBIC AND ANAEROBIC 5CC EACH  Final   Culture   Final    NO GROWTH 5 DAYS Performed at [facility]    Report Status 10/05/2015 FINAL  Final  MRSA PCR Screening     Status: None   Collection Time: 10/01/15 12:48 AM  Result Value Ref Range Status   MRSA by PCR NEGATIVE NEGATIVE Final    Comment:        The GeneXpert MRSA Assay (FDA approved for NASAL specimens only), is one component of a comprehensive MRSA colonization surveillance program. It is  not intended to diagnose  MRSA infection nor to guide or monitor treatment for MRSA infections.   Culture, blood (routine x 2)     Status: None (Preliminary result)   Collection Time: 10/02/15  7:10 PM  Result Value Ref Range Status   Specimen Description BLOOD LEFT ARM  Final   Special Requests IN PEDIATRIC BOTTLE 4CC  Final   Culture   Final    NO GROWTH 3 DAYS [facility]    Report Status PENDING  Incomplete  Culture, blood (routine x 2)     Status: None (Preliminary result)   Collection Time: 10/02/15  7:18 PM  Result Value Ref Range Status   Specimen Description BLOOD RIGHT ARM  Final   Special Requests BOTTLES DRAWN AEROBIC AND ANAEROBIC 5CC  Final   Culture   Final    NO GROWTH 3 DAYS [facility]     Report Status PENDING  Incomplete         Radiology Studies: No results found.    Scheduled Meds: . enoxaparin (LOVENOX) injection  40 mg Subcutaneous QHS  . escitalopram  10 mg Oral Daily  . feeding supplement  1 Container Oral BID BM  . fenofibrate  160 mg Oral Daily  . furosemide  20 mg Intravenous Once  . imipenem-cilastatin  500 mg Intravenous Q6H  . mometasone-formoterol  2 puff Inhalation BID  . ondansetron (ZOFRAN) IV  4 mg Intravenous Q6H  . pantoprazole  40 mg Oral BID  . thiamine  100 mg Oral Daily   Continuous Infusions: . sodium chloride 50 mL/hr at 10/05/15 0600  . dextrose 75 mL/hr at 10/06/15 1056     LOS: 6 days    Time spent in minutes: 35    Raife Lizer, MD [facility] Pager: [email or web address] Password [email or web address] 10/06/2015, 1:31 PM

## 2015-10-07 DIAGNOSIS — J45909 Unspecified asthma, uncomplicated: Secondary | ICD-10-CM

## 2015-10-07 DIAGNOSIS — E781 Pure hyperglyceridemia: Secondary | ICD-10-CM

## 2015-10-07 DIAGNOSIS — I10 Essential (primary) hypertension: Secondary | ICD-10-CM

## 2015-10-07 LAB — CBC
HEMATOCRIT: 31.5 % — AB (ref 36.0–46.0)
HEMOGLOBIN: 10.5 g/dL — AB (ref 12.0–15.0)
MCH: 34 pg (ref 26.0–34.0)
MCHC: 33.3 g/dL (ref 30.0–36.0)
MCV: 101.9 fL — ABNORMAL HIGH (ref 78.0–100.0)
PLATELETS: 160 10*3/uL (ref 150–400)
RBC: 3.09 MIL/uL — ABNORMAL LOW (ref 3.87–5.11)
RDW: 13.2 % (ref 11.5–15.5)
WBC: 4 10*3/uL (ref 4.0–10.5)

## 2015-10-07 LAB — BASIC METABOLIC PANEL
ANION GAP: 5 (ref 5–15)
CALCIUM: 8.7 mg/dL — AB (ref 8.9–10.3)
CO2: 31 mmol/L (ref 22–32)
Chloride: 102 mmol/L (ref 101–111)
Creatinine, Ser: 0.68 mg/dL (ref 0.44–1.00)
GFR calc Af Amer: >60 mL/min (ref >60)
GLUCOSE: 149 mg/dL — AB (ref 65–99)
POTASSIUM: 4.4 mmol/L (ref 3.5–5.1)
Sodium: 138 mmol/L (ref 135–145)

## 2015-10-07 LAB — CULTURE, BLOOD (ROUTINE X 2)
CULTURE: NO GROWTH
CULTURE: NO GROWTH

## 2015-10-07 LAB — GLUCOSE, CAPILLARY
GLUCOSE-CAPILLARY: 146 mg/dL — AB (ref 65–99)
GLUCOSE-CAPILLARY: 121 mg/dL — AB (ref 65–99)
GLUCOSE-CAPILLARY: 165 mg/dL — AB (ref 65–99)
GLUCOSE-CAPILLARY: 117 mg/dL — AB (ref 65–99)

## 2015-10-07 MED ORDER — OXYCODONE HCL 5 MG PO TABS
5.0000 mg | ORAL_TABLET | ORAL | Status: DC | PRN
  Administered 2015-10-07 – 2015-10-08 (×2): 5 mg via ORAL
  Filled 2015-10-07 (×2): qty 1

## 2015-10-07 MED ORDER — DEXTROSE-NACL 5-0.9 % IV SOLN
INTRAVENOUS | Status: DC
  Administered 2015-10-07 (×2): via INTRAVENOUS

## 2015-10-07 MED ORDER — POLYETHYLENE GLYCOL 3350 17 G PO PACK
17.0000 g | PACK | Freq: Two times a day (BID) | ORAL | Status: DC
  Administered 2015-10-07 – 2015-10-08 (×4): 17 g via ORAL
  Filled 2015-10-07 (×5): qty 1

## 2015-10-07 NOTE — Progress Notes (Signed)
  Subjective: Still having some mid epigastric pain and some pain in the LLQ, some bloating with PO, on a low fat full liquid diet.  Objective: Vital signs in last 24 hours: Temp:  [98.1 F (36.7 C)-99.3 F (37.4 C)] 98.1 F (36.7 C) (07/19 0552) Pulse Rate:  [73-78] 73 (07/19 0552) Resp:  [16] 16 (07/19 0552) BP: (124-134)/(70-83) 129/80 mmHg (07/19 0552) SpO2:  [98 %-100 %] 98 % (07/19 0552) Weight:  [68.4 kg (150 lb 12.7 oz)] 68.4 kg (150 lb 12.7 oz) (07/18 1533) Last BM Date: 10/04/15 PO 477 recorded Urine 2300 Afebrile, VSS BMP OK, labs yesterday shows lipase at 261 CT 10/03/15   Intake/Output from previous day: 07/18 0701 - 07/19 0700 In: 1227 [P.O.:477; I.V.:750] Out: 2300 [Urine:2300] Intake/Output this shift:    General appearance: alert, cooperative and no distress GI: soft, still some tenderness as noted above mid epigastric and LLQ.  she is not distended, some flatus, no BM, no peritonitis   Lab Results:   Recent Labs  10/06/15 0314 10/07/15 0539  WBC 3.1* 4.0  HGB 10.4* 10.5*  HCT 31.1* 31.5*  PLT 116* 160    BMET  Recent Labs  10/06/15 0314 10/07/15 0539  NA 137 138  K 3.2* 4.4  CL 103 102  CO2 29 31  GLUCOSE 147* 149*  BUN <5* <5*  CREATININE 0.42* 0.68  CALCIUM 8.0* 8.7*   PT/INR No results for input(s): LABPROT, INR in the last 72 hours.   Recent Labs Lab 09/30/15 1705 10/03/15 1633 10/04/15 0321 10/06/15 0314  AST 241* 33 25 20  ALT 182* 45 40 29  ALKPHOS 83 59 54 58  BILITOT 1.4* 1.5* 1.7* 1.0  PROT 6.5 5.7* 5.5* 6.0*  ALBUMIN 3.6 2.7* 2.5* 2.6*     Lipase     Component Value Date/Time   LIPASE 261* 10/06/2015 0314     Studies/Results: No results found.  Medications: . enoxaparin (LOVENOX) injection  40 mg Subcutaneous QHS  . escitalopram  10 mg Oral Daily  . feeding supplement  1 Container Oral BID BM  . fenofibrate  160 mg Oral Daily  . furosemide  20 mg Intravenous Once  . imipenem-cilastatin  500 mg  Intravenous Q6H  . mometasone-formoterol  2 puff Inhalation BID  . ondansetron (ZOFRAN) IV  4 mg Intravenous Q6H  . pantoprazole  40 mg Oral BID  . thiamine  100 mg Oral Daily   . sodium chloride 50 mL/hr at 10/06/15 2044  . dextrose 75 mL/hr at 10/07/15 [id number]    Assessment/Plan Pancreatitis Hypertriglyceridemia >5000 Elevated LFT's Thrombocytopenia  FEN: low fat full liquids/IV fluids ID: Imipenem-cilastatin day 6 & Vancomycin stopped after 3000 mg Rx. DVT: Lovenox  Plan:  Still some bloating and pain.  Seems to be tolerating full liquids with low fat.  No surgery indicated; ongoing medical management.         LOS: 7 days    Aimee Thompson 10/07/2015 [phone]

## 2015-10-07 NOTE — Progress Notes (Signed)
PROGRESS NOTE        PATIENT DETAILS Name: Aimee Thompson Age: 45 y.o. Sex: female Date of Birth: Feb 18, 1971 Admit Date: 09/30/2015 Admitting Physician Hillary Bow, DO GEX:BMWUX Jonny Ruiz, MD  Brief Narrative: Patient is a 45 y.o. female history of hypertension, asthma admitted with acute pancreatitis secondary to hypertriglyceridemia. She was started on insulin infusion with correction of hypertriglyceridemia, hospital course has been complicated by development of questionable necrosis, fever, hypoglycemia and hyponatremia. Slowly improving with supportive care, diet being advanced and abdominal pain gradually resolving.  Subjective: Feels much better-abdominal pain has significantly improved. Tolerating advancement in diet.  Assessment/Plan: Principal Problem: Acute pancreatitis: due to hypertriglyceridemia.Improving rapidly over the past few days, diet slowly being advanced. Only some mild tenderness on exam this morning. CT scan on 7/15 did show a possible small area of focal necrosis. Has been started on empiric Primaxin-but feel that this small area of necrosis is likely sterile, will plan on discontinuing antibiotics after a total of 7 days (on 7/20). General surgery following.  Active Problems: Hypertriglyceridemia: Treated with IV insulin infusion, triglyceride levels have down trended to just 92 (> 5000 on admission). No longer on insulin infusion, and now has been started on TriCor. She will need close monitoring by her PCP in the outpatient setting. Check A1c.  Fever: Hospital course was complicated by development of fever but now has been afebrile since 7/15. Blood cultures drawn on 7/12, 7/14 are negative. Although patient has a small area of possible pancreatic necrosis-I suspect this is sterile. Fevers could have been from pancreatic inflammation. She has been empirically covered with Primaxin since 7/14-suspect that with clinical improvement she  does not require prolonged antibiotic-we will tentatively plan to stop antibiotics on 7/20.   Hypoglycemia: Occurred while the patient was on a insulin infusion-but continued to have borderline blood sugars even when insulin infusion was discontinued-hence maintained on D10 infusion. CBGs stable-I will discontinue D10-and gently hydrate with D5 normal saline and follow CBGs closely. I suspect with the advancement of diet, hypoglycemia should completely resolved.  History of bronchial asthma: Stable, continue as needed bronchodilators  History of hypertension: Controlled without the use of any antihypertensives.  History of depression and anxiety: Appears stable, continue Xanax and Lexapro.  GERD: Continue PPI  DVT Prophylaxis: Prophylactic Lovenox   Code Status: Full code   Family Communication: Mother at bedside  Disposition Plan: Remain inpatient-home in next 1-2 days  Antimicrobial agents: See below  Procedures: None  CONSULTS:  general surgery  Time spent: 25 minutes-Greater than 50% of this time was spent in counseling, explanation of diagnosis, planning of further management, and coordination of care.  MEDICATIONS: Anti-infectives    Start     Dose/Rate Route Frequency Ordered Stop   10/02/15 2200  vancomycin (VANCOCIN) IVPB 750 mg/150 ml premix  Status:  Discontinued     750 mg 150 mL/hr over 60 Minutes Intravenous Every 12 hours 10/02/15 1928 10/04/15 1350   10/02/15 2000  imipenem-cilastatin (PRIMAXIN) 500 mg in sodium chloride 0.9 % 100 mL IVPB     500 mg 200 mL/hr over 30 Minutes Intravenous Every 6 hours 10/02/15 1925     10/01/15 2200  levofloxacin (LEVAQUIN) IVPB 750 mg  Status:  Discontinued     750 mg 100 mL/hr over 90 Minutes Intravenous Every 24 hours 09/30/15 2206 10/01/15 0915  10/01/15 0600  metroNIDAZOLE (FLAGYL) IVPB 500 mg  Status:  Discontinued     500 mg 100 mL/hr over 60 Minutes Intravenous Every 6 hours 09/30/15 2206 10/01/15 0915    09/30/15 2200  levofloxacin (LEVAQUIN) IVPB 750 mg     750 mg 100 mL/hr over 90 Minutes Intravenous  Once 09/30/15 2152 10/01/15 0018   09/30/15 2200  metroNIDAZOLE (FLAGYL) IVPB 500 mg     500 mg 100 mL/hr over 60 Minutes Intravenous  Once 09/30/15 2152 10/01/15 0127      Scheduled Meds: . enoxaparin (LOVENOX) injection  40 mg Subcutaneous QHS  . escitalopram  10 mg Oral Daily  . feeding supplement  1 Container Oral BID BM  . fenofibrate  160 mg Oral Daily  . furosemide  20 mg Intravenous Once  . imipenem-cilastatin  500 mg Intravenous Q6H  . mometasone-formoterol  2 puff Inhalation BID  . ondansetron (ZOFRAN) IV  4 mg Intravenous Q6H  . pantoprazole  40 mg Oral BID  . thiamine  100 mg Oral Daily   Continuous Infusions: . sodium chloride 50 mL/hr at 10/06/15 2044  . dextrose 75 mL/hr at 10/07/15 0619   PRN Meds:.acetaminophen, albuterol, ALPRAZolam, dextrose, famotidine (PEPCID) IV, HYDROmorphone (DILAUDID) injection   PHYSICAL EXAM: Vital signs: Filed Vitals:   10/06/15 1533 10/06/15 2110 10/07/15 0552 10/07/15 1025  BP: 124/70 134/83 129/80   Pulse: 78 76 73 82  Temp: 99.3 F (37.4 C) 99.1 F (37.3 C) 98.1 F (36.7 C)   TempSrc: Oral Oral Oral   Resp: 16 16 16 16   Height: 5\' 2"  (1.575 m)     Weight: 68.4 kg (150 lb 12.7 oz)     SpO2: 100% 100% 98% 98%   Filed Weights   10/05/15 0500 10/06/15 0500 10/06/15 1533  Weight: 70.9 kg (156 lb 4.9 oz) 67.5 kg (148 lb 13 oz) 68.4 kg (150 lb 12.7 oz)   Body mass index is 27.57 kg/(m^2).   Gen Exam: Awake and alert with clear speech. Not in any distress Neck: Supple, No JVD.   Chest: B/L Clear.   CVS: S1 S2 Regular, no murmurs.  Abdomen: soft, BS +, Mildly tender in the epigastric area-no distention. Extremities: no edema, lower extremities warm to touch. Neurologic: Non Focal.   Skin: No Rash or lesions   Wounds: N/A.   LABORATORY DATA: CBC:  Recent Labs Lab 09/30/15 2230 10/02/15 1655 10/03/15 0316  10/05/15 0313 10/06/15 0314 10/07/15 0539  WBC 10.6* 5.7 4.3 3.5* 3.1* 4.0  NEUTROABS 8.7*  --   --   --   --   --   HGB 15.8* 12.1 10.6* 9.6* 10.4* 10.5*  HCT 44.4 35.1* 29.6* 27.9* 31.1* 31.5*  MCV 94.9 97.0 94.3 100.0 101.0* 101.9*  PLT 241 147* 126* 111* 116* 160    Basic Metabolic Panel:  Recent Labs Lab 10/03/15 1633 10/04/15 0321 10/05/15 0313 10/06/15 0314 10/07/15 0539  NA 136 137 138 137 138  K 3.6 3.0* 3.1* 3.2* 4.4  CL 102 101 102 103 102  CO2 28 31 32 29 31  GLUCOSE 125* 142* 130* 147* 149*  BUN <5* <5* <5* <5* <5*  CREATININE 0.56 0.54 0.47 0.42* 0.68  CALCIUM 7.8* 7.9* 7.8* 8.0* 8.7*  MG  --  1.6*  --  2.1  --     GFR: Estimated Creatinine Clearance: 80.5 mL/min (by C-G formula based on Cr of 0.68).  Liver Function Tests:  Recent Labs Lab 09/30/15 1705 10/03/15 1633 10/04/15  0321 10/06/15 0314  AST 241* 33 25 20  ALT 182* 45 40 29  ALKPHOS 83 59 54 58  BILITOT 1.4* 1.5* 1.7* 1.0  PROT 6.5 5.7* 5.5* 6.0*  ALBUMIN 3.6 2.7* 2.5* 2.6*    Recent Labs Lab 09/30/15 1705 10/01/15 0800 10/02/15 0315 10/03/15 0316 10/06/15 0314  LIPASE 655* 1188* 400* 187* 261*   No results for input(s): AMMONIA in the last 168 hours.  Coagulation Profile: No results for input(s): INR, PROTIME in the last 168 hours.  Cardiac Enzymes: No results for input(s): CKTOTAL, CKMB, CKMBINDEX, TROPONINI in the last 168 hours.  BNP (last 3 results) No results for input(s): PROBNP in the last 8760 hours.  HbA1C: No results for input(s): HGBA1C in the last 72 hours.  CBG:  Recent Labs Lab 10/06/15 1803 10/06/15 2339 10/07/15 0544 10/07/15 0729 10/07/15 1231  GLUCAP 139* 147* 146* 121* 165*    Lipid Profile:  Recent Labs  10/06/15 0314  TRIG 92    Thyroid Function Tests: No results for input(s): TSH, T4TOTAL, FREET4, T3FREE, THYROIDAB in the last 72 hours.  Anemia Panel:  Recent Labs  10/06/15 0314  VITAMINB12 2329*  FOLATE 15.0  FERRITIN  760*  TIBC 197*  IRON 48  RETICCTPCT 1.8    Urine analysis:    Component Value Date/Time   COLORURINE AMBER* 09/30/2015 1823   APPEARANCEUR CLOUDY* 09/30/2015 1823   LABSPEC 1.028 09/30/2015 1823   PHURINE 7.0 09/30/2015 1823   GLUCOSEU NEGATIVE 09/30/2015 1823   GLUCOSEU NEGATIVE 03/26/2015 1704   HGBUR MODERATE* 09/30/2015 1823   BILIRUBINUR NEGATIVE 09/30/2015 1823   KETONESUR NEGATIVE 09/30/2015 1823   PROTEINUR NEGATIVE 09/30/2015 1823   UROBILINOGEN 0.2 03/26/2015 1704   NITRITE NEGATIVE 09/30/2015 1823   LEUKOCYTESUR SMALL* 09/30/2015 1823    Sepsis Labs: Lactic Acid, Venous    Component Value Date/Time   LATICACIDVEN 1.8 10/03/2015 1633    MICROBIOLOGY: Recent Results (from the past 240 hour(s))  Blood culture (routine x 2)     Status: None   Collection Time: 09/30/15  9:29 PM  Result Value Ref Range Status   Specimen Description RIGHT ANTECUBITAL  Final   Special Requests BOTTLES DRAWN AEROBIC AND ANAEROBIC 5CC  Final   Culture   Final    NO GROWTH 5 DAYS Performed at [facility]    Report Status 10/05/2015 FINAL  Final  Blood culture (routine x 2)     Status: None   Collection Time: 09/30/15 10:30 PM  Result Value Ref Range Status   Specimen Description BLOOD LEFT ANTECUBITAL  Final   Special Requests BOTTLES DRAWN AEROBIC AND ANAEROBIC 5CC EACH  Final   Culture   Final    NO GROWTH 5 DAYS Performed at [facility]    Report Status 10/05/2015 FINAL  Final  MRSA PCR Screening     Status: None   Collection Time: 10/01/15 12:48 AM  Result Value Ref Range Status   MRSA by PCR NEGATIVE NEGATIVE Final    Comment:        The GeneXpert MRSA Assay (FDA approved for NASAL specimens only), is one component of a comprehensive MRSA colonization surveillance program. It is not intended to diagnose MRSA infection nor to guide or monitor treatment for MRSA infections.   Culture, blood (routine x 2)     Status: None (Preliminary result)    Collection Time: 10/02/15  7:10 PM  Result Value Ref Range Status   Specimen Description BLOOD LEFT ARM  Final   Special Requests IN PEDIATRIC BOTTLE 4CC  Final   Culture   Final    NO GROWTH 4 DAYS Performed at [facility]    [facility]  Culture, blood (routine x 2)     Status: None (Preliminary result)   Collection Time: 10/02/15  7:18 PM  Result Value Ref Range Status   Specimen Description BLOOD RIGHT ARM  Final   Special Requests BOTTLES DRAWN AEROBIC AND ANAEROBIC 5CC  Final   Culture   Final    NO GROWTH 4 DAYS Performed at [facility]    Report Status PENDING  Incomplete    RADIOLOGY STUDIES/RESULTS: Ct Abdomen Pelvis W Contrast  10/03/2015  CLINICAL DATA:  HTN presenting with abdominal pain and vomiting. Found to have acute pancreatitis per labs and CT scan. Has triglyceride level > 5000. No h/o of elevated Triglycerides in the past. She has been having issues with abdominal discomfort for 6-7 months. Epigastric pain. Nausea, vomiting. Alcohol dependency. EXAM: CT ABDOMEN AND PELVIS WITH CONTRAST TECHNIQUE: Multidetector CT imaging of the abdomen and pelvis was performed using the standard protocol following bolus administration of intravenous contrast. CONTRAST:  ISOVUE-300 IOPAMIDOL (ISOVUE-300) INJECTION 61% COMPARISON:  09/30/2015 and 07/06/2005 FINDINGS: Lower chest: Increased bilateral pleural effusions associated with bibasilar atelectasis, right greater than left. Heart size is normal. No imaged pericardial effusion or significant coronary artery calcifications. Hepatobiliary: No focal hepatic mass identified. Sludge versus contrast within the gallbladder. Pancreas: Significant peripancreatic fluid, increased over prior. Fluid extends throughout the retroperitoneum and into the pelvis. No evidence for pseudocyst. There continues to be a small area of low-attenuation along the posterior aspect of pancreas measuring 10 mm and stable  in appearance. Findings are consistent with focal necrosis, focal pancreatitis, or pancreatic cysts. Small pseudocyst within the pancreatic parenchyma is possible but less likely. Spleen: Normal in appearance. Renal/Adrenal: Adrenal glands have a normal appearance. No hydronephrosis or focal renal mass. There is enhancement of the right ureteral wall, likely secondary to pancreatic exudates. Gastrointestinal tract: Stomach is normal in appearance. The appendix is well seen and has a normal appearance. Small bowel loops have a normal appearance. There is mild thickening of the descending colon wall, likely secondary to pancreatic exudates. Reproductive/Pelvis: The uterus is present.  No adnexal mass. Vascular/Lymphatic: No evidence for aortic aneurysm. No retroperitoneal or mesenteric adenopathy. Musculoskeletal/Abdominal wall: Abdominal wall is unremarkable. Visualized osseous structures have a normal appearance. Other: None IMPRESSION: 1. Increased pancreatic exudates in the retroperitoneum and peritoneal cavity. 2. Stable appearance of small low-attenuation lesion within the posterior aspect of pancreas, consistent with small area of focal pancreatitis, focal necrosis, small cyst, or pseudocyst. 3. Enhancement of the right ureteral wall, likely secondary to pancreatic exudates. 4. Mild thickening of the descending colon, likely secondary to pancreatitis. 5. Increased bilateral pleural effusions and bibasilar atelectasis. 6. Sludge versus contrast within the gallbladder. Electronically Signed   By: Norva Pavlov M.D.   On: 10/03/2015 18:07   Ct Abdomen Pelvis W Contrast  09/30/2015  CLINICAL DATA:  Generalized abdominal pain radiating into the back. Nausea and vomiting for 1 day. EXAM: CT ABDOMEN AND PELVIS WITH CONTRAST TECHNIQUE: Multidetector CT imaging of the abdomen and pelvis was performed using the standard protocol following bolus administration of intravenous contrast. CONTRAST:  ISOVUE-300  IOPAMIDOL (ISOVUE-300) INJECTION 61% COMPARISON:  07/06/2005 FINDINGS: Lower chest:  No acute findings. Hepatobiliary: No masses or other significant abnormality. Pancreas: Mild peripancreatic inflammatory changes no old  fluid collection to suggest an abscess or pseudocyst. Small hypodense area within the pancreatic body measuring 9 mm likely reflecting a more focal area of pancreatitis or early pancreatic necrosis. Spleen: Within normal limits in size and appearance. Adrenals/Urinary Tract: No masses identified. No evidence of hydronephrosis. Stomach/Bowel: No evidence of obstruction, inflammatory process, or abnormal fluid collections. Vascular/Lymphatic: No pathologically enlarged lymph nodes. No evidence of abdominal aortic aneurysm. Reproductive: No mass or other significant abnormality. Other: None. Musculoskeletal:  No suspicious bone lesions identified. IMPRESSION: 1. Findings most consistent with a acute pancreatitis. No pancreatic fluid collection to suggest a pseudocyst or abscess. Small hypodense area within the pancreatic body measuring 9 mm likely reflecting a more focal area of pancreatitis. Electronically Signed   By: Elige KoHetal  Patel   On: 09/30/2015 22:49   Dg Abd Portable 1v  10/02/2015  CLINICAL DATA:  Distended abdomen EXAM: PORTABLE ABDOMEN - 1 VIEW COMPARISON:  None. FINDINGS: Gas within mildly prominent large bowel segments. Oral contrast seen in the right colon. No evidence of bowel obstruction. No free air organomegaly. No acute bony abnormality. IMPRESSION: No acute findings. Electronically Signed   By: Charlett NoseKevin  Dover M.D.   On: 10/02/2015 14:15     LOS: 7 days   Jeoffrey MassedGHIMIRE,Mane Consolo, MD  [facility] Pager:[phone]  If 7PM-7AM, please contact night-coverage [email or web address] Password [email or web address] 10/07/2015, 2:12 PM

## 2015-10-08 LAB — HEMOGLOBIN A1C
Hgb A1c MFr Bld: 5.1 % (ref 4.8–5.6)
MEAN PLASMA GLUCOSE: 100 mg/dL

## 2015-10-08 LAB — GLUCOSE, CAPILLARY
GLUCOSE-CAPILLARY: 150 mg/dL — AB (ref 65–99)
GLUCOSE-CAPILLARY: 181 mg/dL — AB (ref 65–99)
Glucose-Capillary: 107 mg/dL — ABNORMAL HIGH (ref 65–99)
Glucose-Capillary: 115 mg/dL — ABNORMAL HIGH (ref 65–99)
Glucose-Capillary: 116 mg/dL — ABNORMAL HIGH (ref 65–99)

## 2015-10-08 MED ORDER — SENNA 8.6 MG PO TABS
2.0000 | ORAL_TABLET | Freq: Every day | ORAL | Status: DC
  Administered 2015-10-08: 17.2 mg via ORAL
  Filled 2015-10-08 (×2): qty 2

## 2015-10-08 NOTE — Progress Notes (Signed)
Pharmacy Antibiotic Note  Aimee Thompson is a 45 y.o. female admitted on 09/30/2015 with acute pancreatitis, hypertriglyceridemia.  Patient now spiking fever.  Pharmacy has been consulted for vancomycin and Primaxin dosing for sepsis.  Patient has a penicillin allergy, no history of penicillin or cephalosporin use per chart review. CT shows increased fluid collections without obvious pseudocysts and a small amount of necrosis of the pancreas.  Today, 10/08/2015 Day #7 abx, Day #6 Primaxin 500mg  IV q6h Tmax 99.2, WBC WNL, SCr WNL but up from previous PCT negative and Lactate clearing Cultures remain negative  Plan:  Dose remains appropriate,  Agree with plan to stop antibiotics are today's doses to complete 7-days of antibiotic therapy  Continue to monitor renal function and f/u duration of therapy  Height: 5\' 2"  (157.5 cm) Weight: 150 lb 12.7 oz (68.4 kg) IBW/kg (Calculated) : 50.1  Temp (24hrs), Avg:98.7 F (37.1 C), Min:98.4 F (36.9 C), Max:99.2 F (37.3 C)   Recent Labs Lab 10/02/15 1655 10/03/15 0316 10/03/15 1633 10/04/15 0321 10/05/15 0313 10/06/15 0314 10/07/15 0539  WBC 5.7 4.3  --   --  3.5* 3.1* 4.0  CREATININE 0.59 0.50 0.56 0.54 0.47 0.42* 0.68  LATICACIDVEN  --   --  1.8  --   --   --   --     Estimated Creatinine Clearance: 80.5 mL/min (by C-G formula based on Cr of 0.68).    Allergies  Allergen Reactions  . Penicillins Hives    Has patient had a PCN reaction causing immediate rash, facial/tongue/throat swelling, SOB or lightheadedness with hypotension: no Has patient had a PCN reaction causing severe rash involving mucus membranes or skin necrosis: no Has patient had a PCN reaction that required hospitalization no Has patient had a PCN reaction occurring within the last 10 years: yes If all of the above answers are "NO", then may proceed with Cephalosporin use.  Caryl Never. Scallops [Shellfish Allergy] Itching and Other (See Comments)    Causes redness     Antimicrobials this admission: 7/14 Vancomycin >> 7/16  7/14 Primaxin >>   Dose adjustments this admission: -  Microbiology results: 7/12 BCx x2: ngtd 7/13 MRSA PCR: neg 7/14 BCx x2: ngtd  Thank you for allowing pharmacy to be a part of this patient's care.  Juliette Alcideustin Zeigler, PharmD, BCPS.   Pager: [phone] 10/08/2015 10:16 AM

## 2015-10-08 NOTE — Progress Notes (Addendum)
PROGRESS NOTE        PATIENT DETAILS Name: Aimee Thompson Age: 45 y.o. Sex: female Date of Birth: Jan 12, 1971 Admit Date: 09/30/2015 Admitting Physician Hillary Bow, DO [id number] Jonny Ruiz, MD  Brief Narrative: Patient is a 45 y.o. female history of hypertension, asthma admitted with acute pancreatitis secondary to hypertriglyceridemia. She was started on insulin infusion with correction of hypertriglyceridemia, hospital course has been complicated by development of questionable necrosis, fever, hypoglycemia and hyponatremia. Slowly improving with supportive care, diet being advanced and abdominal pain gradually resolving.  Subjective: Continues to have significant improvement in her abdominal pain. No further episodes of hypoglycemia on D5 infusion. Had a small BM this morning.  Assessment/Plan: Principal Problem: Acute pancreatitis: due to hypertriglyceridemia.Improving rapidly over the past few days, tolerating full liquids, will advance to soft diet today. Hardly any tenderness on my exam this morning.  CT scan of the abdomen on 7/15 did show a possible small area of focal necrosis. Was febrile and appropriately started on intravenous Primaxin, I suspect that this probably was a sterile necrosis, with clinical improvement I do not think she needs any further antibiotics as she has completed 7 days of treatment.  General surgery following. Plans are to repeat CT scan of the abdomen the next few weeks-both patient and mother are aware of this recommendation.  Active Problems: Hypertriglyceridemia: Treated with IV insulin infusion, triglyceride levels have down trended to just 92 (> 5000 on admission). No longer on insulin infusion, and now has been started on TriCor. She will need close monitoring by her PCP in the outpatient setting. A1c of 5.1.   Fever: Hospital course was complicated by development of fever but now has been afebrile since 7/15. Blood cultures  drawn on 7/12, 7/14 are negative. Although patient has a small area of possible pancreatic necrosis-I suspect this is sterile. Fevers could have been from pancreatic inflammation. She has been empirically covered with Primaxin since 7/14-suspect that with clinical improvement we can discontinue Primaxin on 7/20 as she has completed 7 days of treatment.   Hypoglycemia: Occurred while the patient was on a insulin infusion-but continued to have borderline blood sugars even when insulin infusion was discontinued-hence was maintained on D10 infusion. Clinical improvement, she was downgraded to a D5 infusion-CBGs continue to be stable-I will go ahead and stop all IV fluids and upgrade her to a soft diet. We will continue to follow closely. A1c of 5.1.  History of bronchial asthma: Stable, continue as needed bronchodilators  History of hypertension: Controlled without the use of any antihypertensives.  History of depression and anxiety: Appears stable, continue Xanax and Lexapro.  GERD: Continue PPI  DVT Prophylaxis: Prophylactic Lovenox   Code Status: Full code   Family Communication: Mother at bedside  Disposition Plan: Remain inpatient-home 7/21 if continues to improve.  Antimicrobial agents: See below  Procedures: None  CONSULTS:  general surgery  Time spent: 25 minutes-Greater than 50% of this time was spent in counseling, explanation of diagnosis, planning of further management, and coordination of care.  MEDICATIONS: Anti-infectives    Start     Dose/Rate Route Frequency Ordered Stop   10/02/15 2200  vancomycin (VANCOCIN) IVPB 750 mg/150 ml premix  Status:  Discontinued     750 mg 150 mL/hr over 60 Minutes Intravenous Every 12 hours 10/02/15 1928 10/04/15 1350   10/02/15 2000  imipenem-cilastatin (PRIMAXIN) 500 mg in sodium chloride 0.9 % 100 mL IVPB  Status:  Discontinued     500 mg 200 mL/hr over 30 Minutes Intravenous Every 6 hours 10/02/15 1925 10/08/15 1146    10/01/15 2200  levofloxacin (LEVAQUIN) IVPB 750 mg  Status:  Discontinued     750 mg 100 mL/hr over 90 Minutes Intravenous Every 24 hours 09/30/15 2206 10/01/15 0915   10/01/15 0600  metroNIDAZOLE (FLAGYL) IVPB 500 mg  Status:  Discontinued     500 mg 100 mL/hr over 60 Minutes Intravenous Every 6 hours 09/30/15 2206 10/01/15 0915   09/30/15 2200  levofloxacin (LEVAQUIN) IVPB 750 mg     750 mg 100 mL/hr over 90 Minutes Intravenous  Once 09/30/15 2152 10/01/15 0018   09/30/15 2200  metroNIDAZOLE (FLAGYL) IVPB 500 mg     500 mg 100 mL/hr over 60 Minutes Intravenous  Once 09/30/15 2152 10/01/15 0127      Scheduled Meds: . enoxaparin (LOVENOX) injection  40 mg Subcutaneous QHS  . escitalopram  10 mg Oral Daily  . feeding supplement  1 Container Oral BID BM  . fenofibrate  160 mg Oral Daily  . mometasone-formoterol  2 puff Inhalation BID  . ondansetron (ZOFRAN) IV  4 mg Intravenous Q6H  . pantoprazole  40 mg Oral BID  . polyethylene glycol  17 g Oral BID  . senna  2 tablet Oral Daily  . thiamine  100 mg Oral Daily   Continuous Infusions:   PRN Meds:.acetaminophen, albuterol, ALPRAZolam, dextrose, famotidine (PEPCID) IV, HYDROmorphone (DILAUDID) injection, oxyCODONE   PHYSICAL EXAM: Vital signs: Filed Vitals:   10/07/15 2012 10/07/15 2139 10/08/15 0625 10/08/15 0739  BP:  121/84 115/69   Pulse:  77 74   Temp:  99.2 F (37.3 C) 98.5 F (36.9 C)   TempSrc:  Oral Oral   Resp:  17 16   Height:      Weight:      SpO2: 98% 100% 99% 99%   Filed Weights   10/05/15 0500 10/06/15 0500 10/06/15 1533  Weight: 70.9 kg (156 lb 4.9 oz) 67.5 kg (148 lb 13 oz) 68.4 kg (150 lb 12.7 oz)   Body mass index is 27.57 kg/(m^2).   Gen Exam: Awake and alert with clear speech. Not in any distress Neck: Supple, No JVD.   Chest: B/L Clear.   CVS: S1 S2 Regular, no murmurs.  Abdomen: soft, BS +, Minimally tender in the epigastric area-no distention. Extremities: no edema, lower extremities warm  to touch. Neurologic: Non Focal.   Skin: No Rash or lesions   Wounds: N/A.   LABORATORY DATA: CBC:  Recent Labs Lab 10/02/15 1655 10/03/15 0316 10/05/15 0313 10/06/15 0314 10/07/15 0539  WBC 5.7 4.3 3.5* 3.1* 4.0  HGB 12.1 10.6* 9.6* 10.4* 10.5*  HCT 35.1* 29.6* 27.9* 31.1* 31.5*  MCV 97.0 94.3 100.0 101.0* 101.9*  PLT 147* 126* 111* 116* 160    Basic Metabolic Panel:  Recent Labs Lab 10/03/15 1633 10/04/15 0321 10/05/15 0313 10/06/15 0314 10/07/15 0539  NA 136 137 138 137 138  K 3.6 3.0* 3.1* 3.2* 4.4  CL 102 101 102 103 102  CO2 28 31 32 29 31  GLUCOSE 125* 142* 130* 147* 149*  BUN <5* <5* <5* <5* <5*  CREATININE 0.56 0.54 0.47 0.42* 0.68  CALCIUM 7.8* 7.9* 7.8* 8.0* 8.7*  MG  --  1.6*  --  2.1  --     GFR: Estimated Creatinine Clearance: 80.5 mL/min (by  C-G formula based on Cr of 0.68).  Liver Function Tests:  Recent Labs Lab 10/03/15 1633 10/04/15 0321 10/06/15 0314  AST 33 25 20  ALT 45 40 29  ALKPHOS 59 54 58  BILITOT 1.5* 1.7* 1.0  PROT 5.7* 5.5* 6.0*  ALBUMIN 2.7* 2.5* 2.6*    Recent Labs Lab 10/02/15 0315 10/03/15 0316 10/06/15 0314  LIPASE 400* 187* 261*   No results for input(s): AMMONIA in the last 168 hours.  Coagulation Profile: No results for input(s): INR, PROTIME in the last 168 hours.  Cardiac Enzymes: No results for input(s): CKTOTAL, CKMB, CKMBINDEX, TROPONINI in the last 168 hours.  BNP (last 3 results) No results for input(s): PROBNP in the last 8760 hours.  HbA1C:  Recent Labs  10/07/15 0539  HGBA1C 5.1    CBG:  Recent Labs Lab 10/07/15 1231 10/07/15 2136 10/08/15 0222 10/08/15 0624 10/08/15 1204  GLUCAP 165* 117* 107* 115* 116*    Lipid Profile:  Recent Labs  10/06/15 0314  TRIG 92    Thyroid Function Tests: No results for input(s): TSH, T4TOTAL, FREET4, T3FREE, THYROIDAB in the last 72 hours.  Anemia Panel:  Recent Labs  10/06/15 0314  VITAMINB12 2329*  FOLATE 15.0  FERRITIN  760*  TIBC 197*  IRON 48  RETICCTPCT 1.8    Urine analysis:    Component Value Date/Time   COLORURINE AMBER* 09/30/2015 1823   APPEARANCEUR CLOUDY* 09/30/2015 1823   LABSPEC 1.028 09/30/2015 1823   PHURINE 7.0 09/30/2015 1823   GLUCOSEU NEGATIVE 09/30/2015 1823   GLUCOSEU NEGATIVE 03/26/2015 1704   HGBUR MODERATE* 09/30/2015 1823   BILIRUBINUR NEGATIVE 09/30/2015 1823   KETONESUR NEGATIVE 09/30/2015 1823   PROTEINUR NEGATIVE 09/30/2015 1823   UROBILINOGEN 0.2 03/26/2015 1704   NITRITE NEGATIVE 09/30/2015 1823   LEUKOCYTESUR SMALL* 09/30/2015 1823    Sepsis Labs: Lactic Acid, Venous    Component Value Date/Time   LATICACIDVEN 1.8 10/03/2015 1633    MICROBIOLOGY: Recent Results (from the past 240 hour(s))  Blood culture (routine x 2)     Status: None   Collection Time: 09/30/15  9:29 PM  Result Value Ref Range Status   Specimen Description RIGHT ANTECUBITAL  Final   Special Requests BOTTLES DRAWN AEROBIC AND ANAEROBIC 5CC  Final   Culture   Final    NO GROWTH 5 DAYS Performed at [facility]    Report Status 10/05/2015 FINAL  Final  Blood culture (routine x 2)     Status: None   Collection Time: 09/30/15 10:30 PM  Result Value Ref Range Status   Specimen Description BLOOD LEFT ANTECUBITAL  Final   Special Requests BOTTLES DRAWN AEROBIC AND ANAEROBIC 5CC EACH  Final   Culture   Final    NO GROWTH 5 DAYS Performed at [facility]    Report Status 10/05/2015 FINAL  Final  MRSA PCR Screening     Status: None   Collection Time: 10/01/15 12:48 AM  Result Value Ref Range Status   MRSA by PCR NEGATIVE NEGATIVE Final    Comment:        The GeneXpert MRSA Assay (FDA approved for NASAL specimens only), is one component of a comprehensive MRSA colonization surveillance program. It is not intended to diagnose MRSA infection nor to guide or monitor treatment for MRSA infections.   Culture, blood (routine x 2)     Status: None   Collection Time:  10/02/15  7:10 PM  Result Value Ref Range Status   Specimen  Description BLOOD LEFT ARM  Final   Special Requests IN PEDIATRIC BOTTLE 4CC  Final   Culture   Final    NO GROWTH 5 DAYS Performed at [facility]    Report Status 10/07/2015 FINAL  Final  Culture, blood (routine x 2)     Status: None   Collection Time: 10/02/15  7:18 PM  Result Value Ref Range Status   Specimen Description BLOOD RIGHT ARM  Final   Special Requests BOTTLES DRAWN AEROBIC AND ANAEROBIC 5CC  Final   Culture   Final    NO GROWTH 5 DAYS Performed at [facility]    Report Status 10/07/2015 FINAL  Final    RADIOLOGY STUDIES/RESULTS: Ct Abdomen Pelvis W Contrast  10/03/2015  CLINICAL DATA:  HTN presenting with abdominal pain and vomiting. Found to have acute pancreatitis per labs and CT scan. Has triglyceride level > 5000. No h/o of elevated Triglycerides in the past. She has been having issues with abdominal discomfort for 6-7 months. Epigastric pain. Nausea, vomiting. Alcohol dependency. EXAM: CT ABDOMEN AND PELVIS WITH CONTRAST TECHNIQUE: Multidetector CT imaging of the abdomen and pelvis was performed using the standard protocol following bolus administration of intravenous contrast. CONTRAST:  ISOVUE-300 IOPAMIDOL (ISOVUE-300) INJECTION 61% COMPARISON:  09/30/2015 and 07/06/2005 FINDINGS: Lower chest: Increased bilateral pleural effusions associated with bibasilar atelectasis, right greater than left. Heart size is normal. No imaged pericardial effusion or significant coronary artery calcifications. Hepatobiliary: No focal hepatic mass identified. Sludge versus contrast within the gallbladder. Pancreas: Significant peripancreatic fluid, increased over prior. Fluid extends throughout the retroperitoneum and into the pelvis. No evidence for pseudocyst. There continues to be a small area of low-attenuation along the posterior aspect of pancreas measuring 10 mm and stable in appearance. Findings are  consistent with focal necrosis, focal pancreatitis, or pancreatic cysts. Small pseudocyst within the pancreatic parenchyma is possible but less likely. Spleen: Normal in appearance. Renal/Adrenal: Adrenal glands have a normal appearance. No hydronephrosis or focal renal mass. There is enhancement of the right ureteral wall, likely secondary to pancreatic exudates. Gastrointestinal tract: Stomach is normal in appearance. The appendix is well seen and has a normal appearance. Small bowel loops have a normal appearance. There is mild thickening of the descending colon wall, likely secondary to pancreatic exudates. Reproductive/Pelvis: The uterus is present.  No adnexal mass. Vascular/Lymphatic: No evidence for aortic aneurysm. No retroperitoneal or mesenteric adenopathy. Musculoskeletal/Abdominal wall: Abdominal wall is unremarkable. Visualized osseous structures have a normal appearance. Other: None IMPRESSION: 1. Increased pancreatic exudates in the retroperitoneum and peritoneal cavity. 2. Stable appearance of small low-attenuation lesion within the posterior aspect of pancreas, consistent with small area of focal pancreatitis, focal necrosis, small cyst, or pseudocyst. 3. Enhancement of the right ureteral wall, likely secondary to pancreatic exudates. 4. Mild thickening of the descending colon, likely secondary to pancreatitis. 5. Increased bilateral pleural effusions and bibasilar atelectasis. 6. Sludge versus contrast within the gallbladder. Electronically Signed   By: Norva Pavlov M.D.   On: 10/03/2015 18:07   Ct Abdomen Pelvis W Contrast  09/30/2015  CLINICAL DATA:  Generalized abdominal pain radiating into the back. Nausea and vomiting for 1 day. EXAM: CT ABDOMEN AND PELVIS WITH CONTRAST TECHNIQUE: Multidetector CT imaging of the abdomen and pelvis was performed using the standard protocol following bolus administration of intravenous contrast. CONTRAST:  ISOVUE-300 IOPAMIDOL (ISOVUE-300)  INJECTION 61% COMPARISON:  07/06/2005 FINDINGS: Lower chest:  No acute findings. Hepatobiliary: No masses or other significant abnormality. Pancreas: Mild  peripancreatic inflammatory changes no old fluid collection to suggest an abscess or pseudocyst. Small hypodense area within the pancreatic body measuring 9 mm likely reflecting a more focal area of pancreatitis or early pancreatic necrosis. Spleen: Within normal limits in size and appearance. Adrenals/Urinary Tract: No masses identified. No evidence of hydronephrosis. Stomach/Bowel: No evidence of obstruction, inflammatory process, or abnormal fluid collections. Vascular/Lymphatic: No pathologically enlarged lymph nodes. No evidence of abdominal aortic aneurysm. Reproductive: No mass or other significant abnormality. Other: None. Musculoskeletal:  No suspicious bone lesions identified. IMPRESSION: 1. Findings most consistent with a acute pancreatitis. No pancreatic fluid collection to suggest a pseudocyst or abscess. Small hypodense area within the pancreatic body measuring 9 mm likely reflecting a more focal area of pancreatitis. Electronically Signed   By: Elige KoHetal  Patel   On: 09/30/2015 22:49   Dg Abd Portable 1v  10/02/2015  CLINICAL DATA:  Distended abdomen EXAM: PORTABLE ABDOMEN - 1 VIEW COMPARISON:  None. FINDINGS: Gas within mildly prominent large bowel segments. Oral contrast seen in the right colon. No evidence of bowel obstruction. No free air organomegaly. No acute bony abnormality. IMPRESSION: No acute findings. Electronically Signed   By: Charlett NoseKevin  Dover M.D.   On: 10/02/2015 14:15     LOS: 8 days   Jeoffrey MassedGHIMIRE,SHANKER, MD  [facility] Pager:[phone]  If 7PM-7AM, please contact night-coverage [email or web address] Password [email or web address] 10/08/2015, 12:17 PM

## 2015-10-09 LAB — GLUCOSE, CAPILLARY
GLUCOSE-CAPILLARY: 111 mg/dL — AB (ref 65–99)
Glucose-Capillary: 116 mg/dL — ABNORMAL HIGH (ref 65–99)

## 2015-10-09 MED ORDER — METOPROLOL SUCCINATE ER 25 MG PO TB24: 25.0000 mg | ORAL_TABLET | Freq: Every day | ORAL | Status: DC

## 2015-10-09 MED ORDER — BOOST / RESOURCE BREEZE PO LIQD: 1.0000 | Freq: Two times a day (BID) | ORAL | Status: DC

## 2015-10-09 MED ORDER — FENOFIBRATE 160 MG PO TABS: 160.0000 mg | ORAL_TABLET | Freq: Every day | ORAL | Status: DC

## 2015-10-09 NOTE — Progress Notes (Signed)
  Subjective: No tenderness, tolerating soft diet, and is scheduled to go home later today.  Objective: Vital signs in last 24 hours: Temp:  [97.9 F (36.6 C)-98.6 F (37 C)] 97.9 F (36.6 C) (07/21 0547) Pulse Rate:  [64-75] 64 (07/21 0547) Resp:  [16-18] 16 (07/21 0547) BP: (107-116)/(67-78) 107/67 mmHg (07/21 0547) SpO2:  [97 %-100 %] 99 % (07/21 0814) Weight:  [61.961 kg (136 lb 9.6 oz)] 61.961 kg (136 lb 9.6 oz) (07/21 0547) Last BM Date: 10/08/15 840 PO  BM x 2 Afebrile, VSS No labs  Intake/Output from previous day: 07/20 0701 - 07/21 0700 In: 840 [P.O.:840] Out: -  Intake/Output this shift:    General appearance: alert, cooperative and no distress GI: soft, non-tender; bowel sounds normal; no masses,  no organomegaly  Lab Results:   Recent Labs  10/07/15 0539  WBC 4.0  HGB 10.5*  HCT 31.5*  PLT 160    BMET  Recent Labs  10/07/15 0539  NA 138  K 4.4  CL 102  CO2 31  GLUCOSE 149*  BUN <5*  CREATININE 0.68  CALCIUM 8.7*   PT/INR No results for input(s): LABPROT, INR in the last 72 hours.   Recent Labs Lab 10/03/15 1633 10/04/15 0321 10/06/15 0314  AST 33 25 20  ALT 45 40 29  ALKPHOS 59 54 58  BILITOT 1.5* 1.7* 1.0  PROT 5.7* 5.5* 6.0*  ALBUMIN 2.7* 2.5* 2.6*     Lipase     Component Value Date/Time   LIPASE 261* 10/06/2015 0314     Studies/Results: No results found.  Medications: . enoxaparin (LOVENOX) injection  40 mg Subcutaneous QHS  . escitalopram  10 mg Oral Daily  . feeding supplement  1 Container Oral BID BM  . fenofibrate  160 mg Oral Daily  . mometasone-formoterol  2 puff Inhalation BID  . ondansetron (ZOFRAN) IV  4 mg Intravenous Q6H  . pantoprazole  40 mg Oral BID  . polyethylene glycol  17 g Oral BID  . senna  2 tablet Oral Daily  . thiamine  100 mg Oral Daily   NO IV fluids    Assessment/Plan Pancreatitis Hypertriglyceridemia >5000 Elevated LFT's Thrombocytopenia  FEN: soft diet ID:  Imipenem-cilastatin discontinued day 7 & Vancomycin stopped after 3000 mg Rx. DVT: Lovenox   Plan: Patient's imprudent to be discharged. Dr. Johna SheriffHoxworth recommended follow-up in several days with CT as an outpatient. Medical management: We will see again as needed.    LOS: 9 days    Donzella Carrol 10/09/2015 [phone]

## 2015-10-09 NOTE — Discharge Summary (Signed)
PATIENT DETAILS Name: Aimee Thompson Age: 45 y.o. Sex: female Date of Birth: [birth year] MRN: [id number]. Admitting Physician: Hillary Bow, DO WUJ:[id number] Jonny Ruiz, MD  Admit Date: 09/30/2015 Discharge date: 10/09/2015  Recommendations for Outpatient Follow-up:  1. Please repeat a CT scan of the abdomen in the next 3-4 weeks. 2. Follow lipid panel closely-started on TriCor 3. Please repeat CBC/BMET at next visit  PRIMARY DISCHARGE DIAGNOSIS:  Principal Problem:   Acute pancreatitis Active Problems:   Asthma   ADD (attention deficit disorder)   Essential hypertension, benign   Hypertriglyceridemia   Distended abdomen   Acute pancreatic necrosis      PAST MEDICAL HISTORY: Past Medical History  Diagnosis Date  . ASTHMA   . HEART MURMUR, HX OF   . ADD (attention deficit disorder)   . Hypertension   . Hypertriglyceridemia, familial     DISCHARGE MEDICATIONS: Current Discharge Medication List    START taking these medications   Details  feeding supplement (BOOST / RESOURCE BREEZE) LIQD Take 1 Container by mouth 2 (two) times daily between meals. Qty: 60 Container, Refills: 0    fenofibrate 160 MG tablet Take 1 tablet (160 mg total) by mouth daily. Qty: 30 tablet, Refills: 0      CONTINUE these medications which have CHANGED   Details  metoprolol succinate (TOPROL-XL) 25 MG 24 hr tablet Take 1 tablet (25 mg total) by mouth daily. TAKE WITH OR IMMEDIATELY FOLLOWING A MEAL Qty: 30 tablet, Refills: 0      CONTINUE these medications which have NOT CHANGED   Details  albuterol (PROVENTIL HFA;VENTOLIN HFA) 108 (90 Base) MCG/ACT inhaler Inhale 2 puffs into the lungs every 6 (six) hours as needed for wheezing or shortness of breath. Qty: 1 Inhaler, Refills: 5    ALPRAZolam (XANAX) 0.25 MG tablet Take 1 tablet (0.25 mg total) by mouth 2 (two) times daily as needed for anxiety. Qty: 60 tablet, Refills: 5    amphetamine-dextroamphetamine (ADDERALL) 10 MG tablet Take 1  tablet (10 mg total) by mouth 2 (two) times daily. Qty: 60 tablet, Refills: 0    budesonide-formoterol (SYMBICORT) 160-4.5 MCG/ACT inhaler Inhale 2 puffs into the lungs 2 (two) times daily. For Shortness of breath Qty: 1 Inhaler, Refills: 6    dicyclomine (BENTYL) 10 MG capsule Take 1 capsule (10 mg total) by mouth 3 (three) times daily before meals. Qty: 90 capsule, Refills: 5    escitalopram (LEXAPRO) 10 MG tablet Take 1 tablet (10 mg total) by mouth daily. Qty: 90 tablet, Refills: 3    ibuprofen (ADVIL,MOTRIN) 200 MG tablet Take 400 mg by mouth every 6 (six) hours as needed (For cramping.). Reported on 03/26/2015    ondansetron (ZOFRAN) 4 MG tablet Take 1 tablet (4 mg total) by mouth 3 (three) times daily as needed for nausea or vomiting. Qty: 30 tablet, Refills: 0    pantoprazole (PROTONIX) 40 MG tablet Take 1 tablet (40 mg total) by mouth 2 (two) times daily. Qty: 60 tablet, Refills: 5    thiamine 100 MG tablet Take 100 mg by mouth daily.        ALLERGIES:   Allergies  Allergen Reactions  . Penicillins Hives    Has patient had a PCN reaction causing immediate rash, facial/tongue/throat swelling, SOB or lightheadedness with hypotension: no Has patient had a PCN reaction causing severe rash involving mucus membranes or skin necrosis: no Has patient had a PCN reaction that required hospitalization no Has patient had a PCN reaction occurring within  the last 10 years: yes If all of the above answers are "NO", then may proceed with Cephalosporin use.  Caryl Never. Scallops [Shellfish Allergy] Itching and Other (See Comments)    Causes redness    BRIEF HPI:  See H&P, Labs, Consult and Test reports for all details in brief, Patient is a 45 y.o. female history of hypertension, asthma admitted with acute pancreatitis secondary to hypertriglyceridemia.  CONSULTATIONS:   GI and general surgery  PERTINENT RADIOLOGIC STUDIES: Ct Abdomen Pelvis W Contrast  10/03/2015  CLINICAL DATA:  HTN  presenting with abdominal pain and vomiting. Found to have acute pancreatitis per labs and CT scan. Has triglyceride level > 5000. No h/o of elevated Triglycerides in the past. She has been having issues with abdominal discomfort for 6-7 months. Epigastric pain. Nausea, vomiting. Alcohol dependency. EXAM: CT ABDOMEN AND PELVIS WITH CONTRAST TECHNIQUE: Multidetector CT imaging of the abdomen and pelvis was performed using the standard protocol following bolus administration of intravenous contrast. CONTRAST:  100mL ISOVUE-300 IOPAMIDOL (ISOVUE-300) INJECTION 61% COMPARISON:  09/30/2015 and 07/06/2005 FINDINGS: Lower chest: Increased bilateral pleural effusions associated with bibasilar atelectasis, right greater than left. Heart size is normal. No imaged pericardial effusion or significant coronary artery calcifications. Hepatobiliary: No focal hepatic mass identified. Sludge versus contrast within the gallbladder. Pancreas: Significant peripancreatic fluid, increased over prior. Fluid extends throughout the retroperitoneum and into the pelvis. No evidence for pseudocyst. There continues to be a small area of low-attenuation along the posterior aspect of pancreas measuring 10 mm and stable in appearance. Findings are consistent with focal necrosis, focal pancreatitis, or pancreatic cysts. Small pseudocyst within the pancreatic parenchyma is possible but less likely. Spleen: Normal in appearance. Renal/Adrenal: Adrenal glands have a normal appearance. No hydronephrosis or focal renal mass. There is enhancement of the right ureteral wall, likely secondary to pancreatic exudates. Gastrointestinal tract: Stomach is normal in appearance. The appendix is well seen and has a normal appearance. Small bowel loops have a normal appearance. There is mild thickening of the descending colon wall, likely secondary to pancreatic exudates. Reproductive/Pelvis: The uterus is present.  No adnexal mass. Vascular/Lymphatic: No evidence  for aortic aneurysm. No retroperitoneal or mesenteric adenopathy. Musculoskeletal/Abdominal wall: Abdominal wall is unremarkable. Visualized osseous structures have a normal appearance. Other: None IMPRESSION: 1. Increased pancreatic exudates in the retroperitoneum and peritoneal cavity. 2. Stable appearance of small low-attenuation lesion within the posterior aspect of pancreas, consistent with small area of focal pancreatitis, focal necrosis, small cyst, or pseudocyst. 3. Enhancement of the right ureteral wall, likely secondary to pancreatic exudates. 4. Mild thickening of the descending colon, likely secondary to pancreatitis. 5. Increased bilateral pleural effusions and bibasilar atelectasis. 6. Sludge versus contrast within the gallbladder. Electronically Signed   By: Norva PavlovElizabeth  Brown M.D.   On: 10/03/2015 18:07   Ct Abdomen Pelvis W Contrast  09/30/2015  CLINICAL DATA:  Generalized abdominal pain radiating into the back. Nausea and vomiting for 1 day. EXAM: CT ABDOMEN AND PELVIS WITH CONTRAST TECHNIQUE: Multidetector CT imaging of the abdomen and pelvis was performed using the standard protocol following bolus administration of intravenous contrast. CONTRAST:  100mL ISOVUE-300 IOPAMIDOL (ISOVUE-300) INJECTION 61% COMPARISON:  07/06/2005 FINDINGS: Lower chest:  No acute findings. Hepatobiliary: No masses or other significant abnormality. Pancreas: Mild peripancreatic inflammatory changes no old fluid collection to suggest an abscess or pseudocyst. Small hypodense area within the pancreatic body measuring 9 mm likely reflecting a more focal area of pancreatitis or early pancreatic necrosis. Spleen: Within normal limits in size  and appearance. Adrenals/Urinary Tract: No masses identified. No evidence of hydronephrosis. Stomach/Bowel: No evidence of obstruction, inflammatory process, or abnormal fluid collections. Vascular/Lymphatic: No pathologically enlarged lymph nodes. No evidence of abdominal aortic  aneurysm. Reproductive: No mass or other significant abnormality. Other: None. Musculoskeletal:  No suspicious bone lesions identified. IMPRESSION: 1. Findings most consistent with a acute pancreatitis. No pancreatic fluid collection to suggest a pseudocyst or abscess. Small hypodense area within the pancreatic body measuring 9 mm likely reflecting a more focal area of pancreatitis. Electronically Signed   By: Elige Ko   On: 09/30/2015 22:49   Dg Abd Portable 1v  10/02/2015  CLINICAL DATA:  Distended abdomen EXAM: PORTABLE ABDOMEN - 1 VIEW COMPARISON:  None. FINDINGS: Gas within mildly prominent large bowel segments. Oral contrast seen in the right colon. No evidence of bowel obstruction. No free air organomegaly. No acute bony abnormality. IMPRESSION: No acute findings. Electronically Signed   By: Charlett Nose M.D.   On: 10/02/2015 14:15     PERTINENT LAB RESULTS: CBC:  Recent Labs  10/07/15 0539  WBC 4.0  HGB 10.5*  HCT 31.5*  PLT 160   CMET CMP     Component Value Date/Time   NA 138 10/07/2015 0539   K 4.4 10/07/2015 0539   CL 102 10/07/2015 0539   CO2 31 10/07/2015 0539   GLUCOSE 149* 10/07/2015 0539   BUN <5* 10/07/2015 0539   CREATININE 0.68 10/07/2015 0539   CALCIUM 8.7* 10/07/2015 0539   PROT 6.0* 10/06/2015 0314   ALBUMIN 2.6* 10/06/2015 0314   AST 20 10/06/2015 0314   ALT 29 10/06/2015 0314   ALKPHOS 58 10/06/2015 0314   BILITOT 1.0 10/06/2015 0314   GFRNONAA >60 10/07/2015 0539   GFRAA >60 10/07/2015 0539    GFR Estimated Creatinine Clearance: 77 mL/min (by C-G formula based on Cr of 0.68). No results for input(s): LIPASE, AMYLASE in the last 72 hours. No results for input(s): CKTOTAL, CKMB, CKMBINDEX, TROPONINI in the last 72 hours. Invalid input(s): POCBNP No results for input(s): DDIMER in the last 72 hours.  Recent Labs  10/07/15 0539  HGBA1C 5.1   No results for input(s): CHOL, HDL, LDLCALC, TRIG, CHOLHDL, LDLDIRECT in the last 72 hours. No  results for input(s): TSH, T4TOTAL, T3FREE, THYROIDAB in the last 72 hours.  Invalid input(s): FREET3 No results for input(s): VITAMINB12, FOLATE, FERRITIN, TIBC, IRON, RETICCTPCT in the last 72 hours. Coags: No results for input(s): INR in the last 72 hours.  Invalid input(s): PT Microbiology: Recent Results (from the past 240 hour(s))  Blood culture (routine x 2)     Status: None   Collection Time: 09/30/15  9:29 PM  Result Value Ref Range Status   Specimen Description RIGHT ANTECUBITAL  Final   Special Requests BOTTLES DRAWN AEROBIC AND ANAEROBIC 5CC  Final   Culture   Final    NO GROWTH 5 DAYS Performed at [facility]    Report Status 10/05/2015 FINAL  Final  Blood culture (routine x 2)     Status: None   Collection Time: 09/30/15 10:30 PM  Result Value Ref Range Status   Specimen Description BLOOD LEFT ANTECUBITAL  Final   Special Requests BOTTLES DRAWN AEROBIC AND ANAEROBIC 5CC EACH  Final   Culture   Final    NO GROWTH 5 DAYS Performed at [facility]    Report Status 10/05/2015 FINAL  Final  MRSA PCR Screening     Status: None   Collection Time:  10/01/15 12:48 AM  Result Value Ref Range Status   MRSA by PCR NEGATIVE NEGATIVE Final    Comment:        The GeneXpert MRSA Assay (FDA approved for NASAL specimens only), is one component of a comprehensive MRSA colonization surveillance program. It is not intended to diagnose MRSA infection nor to guide or monitor treatment for MRSA infections.   Culture, blood (routine x 2)     Status: None   Collection Time: 10/02/15  7:10 PM  Result Value Ref Range Status   Specimen Description BLOOD LEFT ARM  Final   Special Requests IN PEDIATRIC BOTTLE 4CC  Final   Culture   Final    NO GROWTH 5 DAYS Performed at [facility]    Report Status 10/07/2015 FINAL  Final  Culture, blood (routine x 2)     Status: None   Collection Time: 10/02/15  7:18 PM  Result Value Ref Range Status   Specimen  Description BLOOD RIGHT ARM  Final   Special Requests BOTTLES DRAWN AEROBIC AND ANAEROBIC 5CC  Final   Culture   Final    NO GROWTH 5 DAYS Performed at [facility]    Report Status 10/07/2015 FINAL  Final     BRIEF HOSPITAL COURSE:  Acute pancreatitis: due to hypertriglyceridemia.admitted and provided supportive care, because of hypertriglyceridemia she was started on intravenous insulin infusion. Diet was slowly advanced, by day of discharge tolerating a low-fat diet. She is also completely pain-free at the time of discharge. Abdominal exam is a very benign on discharge. Hospital course was complicated by persistent fever, a repeat CT scan of the abdomen  on 7/15 did show a possible small area of focal necrosis. She was started on empiric intravenous Primaxin, I suspect that this probably was a sterile necrosis, with clinical improvement I do not think she needs any further antibiotics as she has completed 7 days of treatment.Recommendations re to repeat CT scan of the abdomen the next few weeks to make sure that all the information to changes seen on prior CT have resolved and and to make sure the patient has not developed any pseudocysts-both patient and mother are aware of this recommendation.  Active Problems: Hypertriglyceridemia: Treated with IV insulin infusion, triglyceride levels have down trended to just 92 (> 5000 on admission). No longer on insulin infusion, and now has been started on TriCor. She will need close monitoring by her PCP in the outpatient setting. A1c of 5.1.   Fever: Hospital course was complicated by development of fever but now has been afebrile since 7/15. Blood cultures drawn on 7/12, 7/14 are negative. Although patient has a small area of possible pancreatic necrosis-I suspect this is sterile. Fevers could have been from pancreatic inflammation. She has been empirically covered with Primaxin since 7/14-suspect that with clinical improvement-this was  discontinued on 7/20 as she has completed 7 days of treatment. She remains afebrile after discontinuation of antibiotics.  Hypoglycemia: Occurred while the patient was on a insulin infusion-but continued to have borderline blood sugars even when insulin infusion was discontinued-hence was maintained on D10 infusion. With clinical improvement she was  downgraded to a D5 infusion-and as tired was advanced this was discontinued. CBGs stable without any further incidents of hypoglycemia over the past few days. A1c of 5.1.  History of bronchial asthma: Stable, continue as needed bronchodilators  History of hypertension: Controlled without the use of any antihypertensives. We will plan on resuming metoprolol at half her home  dose on discharge.  History of depression and anxiety: Appears stable, continue Xanax and Lexapro.  Anemia: Suspect secondary to acute illness, repeat CBC at next visit with PCP.  TODAY-DAY OF DISCHARGE:  Subjective:   Aimee Thompson today has no headache,no chest abdominal pain,no new weakness tingling or numbness, feels much better wants to go home today. She also had a bowel movement earlier this morning.  Objective:   Blood pressure 107/67, pulse 64, temperature 97.9 F (36.6 C), temperature source Oral, resp. rate 16, height  (1.575 m), weight 61.961 kg (136 lb 9.6 oz), last menstrual period 09/16/2015, SpO2 99 %.  Intake/Output Summary (Last 24 hours) at 10/09/15 1213 Last data filed at 10/09/15 0830  Gross per 24 hour  Intake    720 ml  Output      0 ml  Net    720 ml   Filed Weights   10/06/15 0500 10/06/15 1533 10/09/15 0547  Weight: 67.5 kg (148 lb 13 oz) 68.4 kg (150 lb 12.7 oz) 61.961 kg (136 lb 9.6 oz)    Exam Awake Alert, Oriented *3, No new F.N deficits, Normal affect [place].AT,PERRAL Supple Neck,No JVD, No cervical lymphadenopathy appriciated.  Symmetrical Chest wall movement, Good air movement bilaterally, CTAB RRR,No Gallops,Rubs or new  Murmurs, No Parasternal Heave +ve B.Sounds, Abd Soft, Non tender, No organomegaly appriciated, No rebound -guarding or rigidity. No Cyanosis, Clubbing or edema, No new Rash or bruise  DISCHARGE CONDITION: Stable  DISPOSITION: Home  DISCHARGE INSTRUCTIONS:    Activity:  As tolerated   Get Medicines reviewed and adjusted: Please take all your medications with you for your next visit with your Primary MD  Please request your Primary MD to go over all hospital tests and procedure/radiological results at the follow up, please ask your Primary MD to get all Hospital records sent to his/her office.  If you experience worsening of your admission symptoms, develop shortness of breath, life threatening emergency, suicidal or homicidal thoughts you must seek medical attention immediately by calling 911 or calling your MD immediately  if symptoms less severe.  You must read complete instructions/literature along with all the possible adverse reactions/side effects for all the Medicines you take and that have been prescribed to you. Take any new Medicines after you have completely understood and accpet all the possible adverse reactions/side effects.   Do not drive when taking Pain medications.   Do not take more than prescribed Pain, Sleep and Anxiety Medications  Special Instructions: If you have smoked or chewed Tobacco  in the last 2 yrs please stop smoking, stop any regular Alcohol  and or any Recreational drug use.  Wear Seat belts while driving.  Please note  You were cared for by a hospitalist during your hospital stay. Once you are discharged, your primary care physician will handle any further medical issues. Please note that NO REFILLS for any discharge medications will be authorized once you are discharged, as it is imperative that you return to your primary care physician (or establish a relationship with a primary care physician if you do not have one) for your aftercare needs so  that they can reassess your need for medications and monitor your lab values.   Diet recommendation: Heart Healthy diet-low fat diet   Discharge Instructions    Call MD for:  persistant nausea and vomiting    Complete by:  As directed      Call MD for:  severe uncontrolled pain    Complete by:  As directed      Diet - low sodium heart healthy    Complete by:  As directed      Discharge instructions    Complete by:  As directed   Follow with Primary MD  Oliver Barre, MD  and other consultant as instructed your Hospitalist MD  Please get a complete blood count and chemistry panel checked by your Primary MD at your next visit, and again as instructed by your Primary MD.  Please get a cholesterol panel/lipid panel checked in the next 3 months  You will require a repeat CT scan of the abdomen in the next 3-4 weeks. Please inquire with your primary care doctor about this.  Get Medicines reviewed and adjusted. Please take all your medications with you for your next visit with your Primary MD  Please request your Primary MD to go over all hospital tests and procedure/radiological results at the follow up, please ask your Primary MD to get all Hospital records sent to his/her office.  If you experience worsening of your admission symptoms, develop shortness of breath, life threatening emergency, suicidal or homicidal thoughts you must seek medical attention immediately by calling 911 or calling your MD immediately  if symptoms less severe.  You must read complete instructions/literature along with all the possible adverse reactions/side effects for all the Medicines you take and that have been prescribed to you. Take any new Medicines after you have completely understood and accpet all the possible adverse reactions/side effects.   Do not drive when taking Pain medications or sleeping medications (Benzodaizepines)  Do not take more than prescribed Pain, Sleep and Anxiety Medications  Special  Instructions: If you have smoked or chewed Tobacco  in the last 2 yrs please stop smoking, stop any regular Alcohol  and or any Recreational drug use.  Wear Seat belts while driving.  Please note  You were cared for by a hospitalist during your hospital stay. Once you are discharged, your primary care physician will handle any further medical issues. Please note that NO REFILLS for any discharge medications will be authorized once you are discharged, as it is imperative that you return to your primary care physician (or establish a relationship with a primary care physician if you do not have one) for your aftercare needs so that they can reassess your need for medications and monitor your lab values.     Increase activity slowly    Complete by:  As directed           Follow-up Information    Follow up with Oliver Barre, MD. Schedule an appointment as soon as possible for a visit in 1 week.   Specialties:  Internal Medicine, Radiology   Why:  Hospital follow up   Contact information:   [address] Maggie Schwalbe [facility] [place] [address] [phone]       Total Time spent on discharge equals 45 minutes.  SignedJeoffrey Massed 10/09/2015 12:13 PM

## 2015-10-28 ENCOUNTER — Ambulatory Visit (INDEPENDENT_AMBULATORY_CARE_PROVIDER_SITE_OTHER): Payer: Self-pay | Admitting: Internal Medicine

## 2015-10-28 ENCOUNTER — Other Ambulatory Visit (INDEPENDENT_AMBULATORY_CARE_PROVIDER_SITE_OTHER): Payer: Self-pay

## 2015-10-28 VITALS — BP 122/80 | HR 56 | Temp 98.7°F | Resp 16 | Ht 62.0 in | Wt 145.0 lb

## 2015-10-28 DIAGNOSIS — K859 Acute pancreatitis without necrosis or infection, unspecified: Secondary | ICD-10-CM

## 2015-10-28 DIAGNOSIS — I10 Essential (primary) hypertension: Secondary | ICD-10-CM

## 2015-10-28 DIAGNOSIS — E781 Pure hyperglyceridemia: Secondary | ICD-10-CM

## 2015-10-28 DIAGNOSIS — R6889 Other general symptoms and signs: Secondary | ICD-10-CM

## 2015-10-28 DIAGNOSIS — Z0001 Encounter for general adult medical examination with abnormal findings: Secondary | ICD-10-CM

## 2015-10-28 LAB — BASIC METABOLIC PANEL
BUN: 9 mg/dL (ref 6–23)
CALCIUM: 9.3 mg/dL (ref 8.4–10.5)
CO2: 30 meq/L (ref 19–32)
Chloride: 104 mEq/L (ref 96–112)
Creatinine, Ser: 0.79 mg/dL (ref 0.40–1.20)
GFR: 83.49 mL/min (ref >60.00)
Glucose, Bld: 102 mg/dL — ABNORMAL HIGH (ref 70–99)
Potassium: 4.6 mEq/L (ref 3.5–5.1)
SODIUM: 139 meq/L (ref 135–145)

## 2015-10-28 LAB — CBC WITH DIFFERENTIAL/PLATELET
BASOS ABS: 0 10*3/uL (ref 0.0–0.1)
Basophils Relative: 0.5 % (ref 0.0–3.0)
Eosinophils Absolute: 0.2 10*3/uL (ref 0.0–0.7)
Eosinophils Relative: 4.4 % (ref 0.0–5.0)
HEMATOCRIT: 35.8 % — AB (ref 36.0–46.0)
HEMOGLOBIN: 12.4 g/dL (ref 12.0–15.0)
LYMPHS PCT: 30.9 % (ref 12.0–46.0)
Lymphs Abs: 1.7 10*3/uL (ref 0.7–4.0)
MCHC: 34.6 g/dL (ref 30.0–36.0)
MCV: 96.7 fl (ref 78.0–100.0)
MONOS PCT: 9 % (ref 3.0–12.0)
Monocytes Absolute: 0.5 10*3/uL (ref 0.1–1.0)
NEUTROS ABS: 3.1 10*3/uL (ref 1.4–7.7)
Neutrophils Relative %: 55.2 % (ref 43.0–77.0)
Platelets: 246 10*3/uL (ref 150.0–400.0)
RBC: 3.71 Mil/uL — AB (ref 3.87–5.11)
RDW: 13.3 % (ref 11.5–15.5)
WBC: 5.6 10*3/uL (ref 4.0–10.5)

## 2015-10-28 MED ORDER — ESCITALOPRAM OXALATE 10 MG PO TABS: 10.0000 mg | ORAL_TABLET | Freq: Every day | ORAL | 3 refills | Status: DC

## 2015-10-28 MED ORDER — AMPHETAMINE-DEXTROAMPHETAMINE 10 MG PO TABS
10.0000 mg | ORAL_TABLET | Freq: Two times a day (BID) | ORAL | 0 refills | Status: DC
Start: 2015-10-28 — End: 2016-10-20

## 2015-10-28 NOTE — Progress Notes (Signed)
Pre visit review using our clinic review tool, if applicable. No additional management support is needed unless otherwise documented below in the visit note. 

## 2015-10-28 NOTE — Patient Instructions (Signed)
Please continue all other medications as before, and refills have been done if requested.  Please have the pharmacy call with any other refills you may need.  Please continue your efforts at being more active, low cholesterol diet, and weight control.  Please keep your appointments with your specialists as you may have planned  You will be contacted regarding the referral for: CT scan for Aug 21  Please go to the LAB in the Basement (turn left off the elevator) for the tests to be done today  You will be contacted by phone if any changes need to be made immediately.  Otherwise, you will receive a letter about your results with an explanation, but please check with MyChart first.  Please remember to sign up for MyChart if you have not done so, as this will be important to you in the future with finding out test results, communicating by private email, and scheduling acute appointments online when needed.  Please return in 6 months, or sooner if needed, with Lab testing done 3-5 days before

## 2015-10-28 NOTE — Progress Notes (Signed)
Subjective:    Patient ID: Aimee Thompson, female    DOB: 1970/11/22, 45 y.o.   MRN: [id number]  HPI  Here to f/u recent hospn for acute pancreatitis, felt due to hypertrig; admitted for support care, IV insulin, NPO then diet advanced to low fat, became pain free and exam benign.  Did have persistent fever, CT abd with ? Area of focal necrosis, then completing 7 days IV primaxin.  Pt also now on tricor, recent a1c normal.  Cultures negative, BP stable with restarting metoprolol half dose at d/c and cont'd on psych meds.  Today - Denies worsening reflux, abd pain, dysphagia, n/v, bowel change or blood.  Pt denies fever, wt loss, night sweats, loss of appetite, or other constitutional symptoms  Pt denies chest pain, increased sob or doe, wheezing, orthopnea, PND, increased LE swelling, palpitations, dizziness or syncope. Past Medical History:  Diagnosis Date  . ADD (attention deficit disorder)   . ASTHMA   . HEART MURMUR, HX OF   . Hypertension   . Hypertriglyceridemia, familial    Past Surgical History:  Procedure Laterality Date  . CESAREAN SECTION  2011  . KNEE SURGERY  1990  . laproscopy    . SHOULDER SURGERY Right 2012    reports that she has never smoked. She has never used smokeless tobacco. She reports that she drinks alcohol. She reports that she does not use drugs. family history includes Hypertension in her other. Allergies  Allergen Reactions  . Penicillins Hives    Has patient had a PCN reaction causing immediate rash, facial/tongue/throat swelling, SOB or lightheadedness with hypotension: no Has patient had a PCN reaction causing severe rash involving mucus membranes or skin necrosis: no Has patient had a PCN reaction that required hospitalization no Has patient had a PCN reaction occurring within the last 10 years: yes If all of the above answers are "NO", then may proceed with Cephalosporin use.  Caryl Never [Shellfish Allergy] Itching and Other (See Comments)   Causes redness   Current Outpatient Prescriptions on File Prior to Visit  Medication Sig Dispense Refill  . albuterol (PROVENTIL HFA;VENTOLIN HFA) 108 (90 Base) MCG/ACT inhaler Inhale 2 puffs into the lungs every 6 (six) hours as needed for wheezing or shortness of breath. 1 Inhaler 5  . ALPRAZolam (XANAX) 0.25 MG tablet Take 1 tablet (0.25 mg total) by mouth 2 (two) times daily as needed for anxiety. 60 tablet 5  . budesonide-formoterol (SYMBICORT) 160-4.5 MCG/ACT inhaler Inhale 2 puffs into the lungs 2 (two) times daily. For Shortness of breath (Patient taking differently: Inhale 2 puffs into the lungs 2 (two) times daily as needed (For shortness of breath.). ) 1 Inhaler 6  . dicyclomine (BENTYL) 10 MG capsule Take 1 capsule (10 mg total) by mouth 3 (three) times daily before meals. 90 capsule 5  . feeding supplement (BOOST / RESOURCE BREEZE) LIQD Take 1 Container by mouth 2 (two) times daily between meals. 60 Container 0  . fenofibrate 160 MG tablet Take 1 tablet (160 mg total) by mouth daily. 30 tablet 0  . ibuprofen (ADVIL,MOTRIN) 200 MG tablet Take 400 mg by mouth every 6 (six) hours as needed (For cramping.). Reported on 03/26/2015    . metoprolol succinate (TOPROL-XL) 25 MG 24 hr tablet Take 1 tablet (25 mg total) by mouth daily. TAKE WITH OR IMMEDIATELY FOLLOWING A MEAL 30 tablet 0  . ondansetron (ZOFRAN) 4 MG tablet Take 1 tablet (4 mg total) by mouth 3 (three) times daily  as needed for nausea or vomiting. 30 tablet 0  . pantoprazole (PROTONIX) 40 MG tablet Take 1 tablet (40 mg total) by mouth 2 (two) times daily. 60 tablet 5  . thiamine 100 MG tablet Take 100 mg by mouth daily.     No current facility-administered medications on file prior to visit.     Review of Systems  Constitutional: Negative for unusual diaphoresis or night sweats HENT: Negative for ear swelling or discharge Eyes: Negative for worsening visual haziness  Respiratory: Negative for choking and stridor.     Gastrointestinal: Negative for distension or worsening eructation Genitourinary: Negative for retention or change in urine volume.  Musculoskeletal: Negative for other MSK pain or swelling Skin: Negative for color change and worsening wound Neurological: Negative for tremors and numbness other than noted  Psychiatric/Behavioral: Negative for decreased concentration or agitation other than above       Objective:   Physical Exam BP 122/80   Pulse (!) 56   Temp 98.7 F (37.1 C) (Oral)   Resp 16   Ht 5\' 2"  (1.575 m)   Wt 145 lb (65.8 kg)   LMP 09/16/2015   SpO2 98%   BMI 26.52 kg/m  VS noted,  Constitutional: Pt appears in no apparent distress HENT: Head: NCAT.  Right Ear: External ear normal.  Left Ear: External ear normal.  Eyes: . Pupils are equal, round, and reactive to light. Conjunctivae and EOM are normal Neck: Normal range of motion. Neck supple.  Cardiovascular: Normal rate and regular rhythm.   Pulmonary/Chest: Effort normal and breath sounds without rales or wheezing.  Abd:  Soft, NT, ND, + BS Neurological: Pt is alert. Not confused , motor grossly intact Skin: Skin is warm. No rash, no LE edema Psychiatric: Pt behavior is normal. No agitation.     Assessment & Plan:

## 2015-10-29 ENCOUNTER — Telehealth: Payer: Self-pay | Admitting: Internal Medicine

## 2015-10-29 ENCOUNTER — Other Ambulatory Visit (INDEPENDENT_AMBULATORY_CARE_PROVIDER_SITE_OTHER): Payer: Self-pay

## 2015-10-29 DIAGNOSIS — R634 Abnormal weight loss: Secondary | ICD-10-CM

## 2015-10-29 LAB — LIPID PANEL
CHOLESTEROL: 207 mg/dL — AB (ref 0–200)
HDL: 54.9 mg/dL (ref >39.00)
LDL CALC: 115 mg/dL — AB (ref 0–99)
NonHDL: 151.72
TRIGLYCERIDES: 184 mg/dL — AB (ref 0.0–149.0)
Total CHOL/HDL Ratio: 4
VLDL: 36.8 mg/dL (ref 0.0–40.0)

## 2015-10-29 LAB — TSH: TSH: 1.55 u[IU]/mL (ref 0.35–4.50)

## 2015-10-29 NOTE — Telephone Encounter (Signed)
Patient has called back in regard?  °

## 2015-10-29 NOTE — Telephone Encounter (Signed)
Pt called in and has some questions about her labs that were taking yesterday.  She is concerned that some of the labs that she needed done was not takin?

## 2015-10-29 NOTE — Telephone Encounter (Signed)
Spoke with patient, labs added on

## 2015-11-03 NOTE — Assessment & Plan Note (Signed)
Exam remains benign post hospn, for f/u ct about aug 21,  to f/u any worsening symptoms or concerns

## 2015-11-03 NOTE — Assessment & Plan Note (Signed)
BP Readings from Last 3 Encounters:  10/28/15 122/80  10/09/15 107/67  09/30/15 110/80   Stable , to resume prior dose BB,  to f/u any worsening symptoms or concerns

## 2015-11-03 NOTE — Assessment & Plan Note (Signed)
asympt on tricor, for f/u lab as planned,  to f/u any worsening symptoms or concerns

## 2015-11-12 MED FILL — METOPROLOL SUCC ER 25 MG TA: 25 | 30 days supply | Qty: 30 | Fill #0

## 2015-11-12 MED FILL — ESCITALOPRAM 10 MG TABLET: 10 | 90 days supply | Qty: 90 | Fill #0

## 2015-11-12 MED FILL — FENOFIBRATE 160 MG TABLET: 160 | 30 days supply | Qty: 30 | Fill #0

## 2015-11-17 ENCOUNTER — Encounter: Payer: Self-pay | Admitting: Internal Medicine

## 2015-12-16 ENCOUNTER — Telehealth: Payer: Self-pay | Admitting: *Deleted

## 2015-12-16 MED ORDER — FENOFIBRATE 160 MG PO TABS: 160.0000 mg | ORAL_TABLET | Freq: Every day | ORAL | 1 refills | Status: DC

## 2015-12-16 MED ORDER — METOPROLOL SUCCINATE ER 25 MG PO TB24: 25.0000 mg | ORAL_TABLET | Freq: Every day | ORAL | 3 refills | Status: DC

## 2015-12-16 MED ORDER — METOPROLOL SUCCINATE ER 25 MG PO TB24: 25.0000 mg | ORAL_TABLET | Freq: Every day | ORAL | 1 refills | Status: DC

## 2015-12-16 MED ORDER — FENOFIBRATE 160 MG PO TABS: 160.0000 mg | ORAL_TABLET | Freq: Every day | ORAL | 3 refills | Status: DC

## 2015-12-16 MED ORDER — ALPRAZOLAM 0.25 MG PO TABS: 0.2500 mg | ORAL_TABLET | Freq: Two times a day (BID) | ORAL | 5 refills | Status: DC | PRN

## 2015-12-16 NOTE — Telephone Encounter (Signed)
Left msg on triage requesting refills to be sent to [facility] for fenofibrate, metoprolol, and xanax. Dent maintenance pls advise on xanax...Raechel Chute/lmb

## 2015-12-16 NOTE — Telephone Encounter (Signed)
Done hardcopy to Corinne  

## 2015-12-17 NOTE — Telephone Encounter (Signed)
faxed

## 2016-01-05 MED FILL — METOPROLOL SUCC ER 25 MG TA: 25 | 90 days supply | Qty: 90 | Fill #0

## 2016-01-05 MED FILL — FENOFIBRATE 160 MG TABLET: 160 | 30 days supply | Qty: 30 | Fill #0

## 2016-01-05 MED FILL — ALPRAZolam 0.25 MG TABS: 0.25 | 30 days supply | Qty: 60 | Fill #0

## 2016-02-26 MED FILL — FENOFIBRATE 160 MG TABLET: 160 | 30 days supply | Qty: 30 | Fill #1

## 2016-02-26 MED FILL — ALPRAZolam 0.25 MG TABS: 0.25 | 30 days supply | Qty: 60 | Fill #1

## 2016-02-26 MED FILL — ESCITALOPRAM 10 MG TABLET: 10 | 90 days supply | Qty: 90 | Fill #1

## 2016-04-08 MED FILL — ALPRAZolam 0.25 MG TABS: 0.25 | 30 days supply | Qty: 60 | Fill #2

## 2016-05-03 ENCOUNTER — Other Ambulatory Visit: Payer: Self-pay | Admitting: *Deleted

## 2016-05-03 MED ORDER — ESCITALOPRAM OXALATE 10 MG PO TABS: 10.0000 mg | ORAL_TABLET | Freq: Every day | ORAL | 0 refills | Status: DC

## 2016-05-03 MED FILL — METOPROLOL SUCC ER 25 MG TA: 25 | 90 days supply | Qty: 90 | Fill #1

## 2016-05-03 MED FILL — FENOFIBRATE 160 MG TABLET: 160 | 30 days supply | Qty: 30 | Fill #2

## 2016-05-03 MED FILL — ESCITALOPRAM 10 MG TABLET: 10 | 90 days supply | Qty: 90 | Fill #2

## 2016-05-03 NOTE — Telephone Encounter (Signed)
Rec'd call pt requesting refill on her Lexapro to be sent to [facility]. Inform will send electronically...Raechel Chute/LMB

## 2016-08-08 ENCOUNTER — Other Ambulatory Visit: Payer: Self-pay | Admitting: Internal Medicine

## 2016-08-08 MED FILL — FENOFIBRATE 160 MG TABLET: 160 | 30 days supply | Qty: 30 | Fill #3

## 2016-08-08 MED FILL — ESCITALOPRAM 10 MG TABLET: 10 | 90 days supply | Qty: 90 | Fill #3

## 2016-08-08 MED FILL — METOPROLOL SUCC ER 25 MG TA: 25 | 30 days supply | Qty: 30 | Fill #0

## 2016-08-09 ENCOUNTER — Other Ambulatory Visit: Payer: Self-pay | Admitting: Internal Medicine

## 2016-08-10 NOTE — Telephone Encounter (Signed)
Done hardcopy to Shirron  

## 2016-08-10 NOTE — Telephone Encounter (Signed)
faxed

## 2016-10-08 MED FILL — FENOFIBRATE 160 MG TABLET: 160 | 30 days supply | Qty: 30 | Fill #4

## 2016-10-08 MED FILL — METOPROLOL SUCC ER 25 MG TA: 25 | 30 days supply | Qty: 30 | Fill #1

## 2016-10-13 MED FILL — ESCITALOPRAM 10 MG TABLET: 10 | 30 days supply | Qty: 30 | Fill #0

## 2016-10-20 ENCOUNTER — Encounter: Payer: Self-pay | Admitting: Internal Medicine

## 2016-10-20 ENCOUNTER — Ambulatory Visit: Payer: Self-pay | Admitting: Internal Medicine

## 2016-10-20 ENCOUNTER — Ambulatory Visit (INDEPENDENT_AMBULATORY_CARE_PROVIDER_SITE_OTHER): Payer: 59 | Admitting: Internal Medicine

## 2016-10-20 ENCOUNTER — Other Ambulatory Visit (INDEPENDENT_AMBULATORY_CARE_PROVIDER_SITE_OTHER): Payer: Self-pay

## 2016-10-20 VITALS — BP 118/78 | HR 62 | Ht 62.0 in | Wt 146.0 lb

## 2016-10-20 DIAGNOSIS — R109 Unspecified abdominal pain: Secondary | ICD-10-CM | POA: Diagnosis not present

## 2016-10-20 DIAGNOSIS — F32A Depression, unspecified: Secondary | ICD-10-CM

## 2016-10-20 DIAGNOSIS — Z23 Encounter for immunization: Secondary | ICD-10-CM | POA: Diagnosis not present

## 2016-10-20 DIAGNOSIS — R739 Hyperglycemia, unspecified: Secondary | ICD-10-CM

## 2016-10-20 DIAGNOSIS — F988 Other specified behavioral and emotional disorders with onset usually occurring in childhood and adolescence: Secondary | ICD-10-CM

## 2016-10-20 DIAGNOSIS — F329 Major depressive disorder, single episode, unspecified: Secondary | ICD-10-CM

## 2016-10-20 DIAGNOSIS — Z0001 Encounter for general adult medical examination with abnormal findings: Secondary | ICD-10-CM

## 2016-10-20 DIAGNOSIS — I10 Essential (primary) hypertension: Secondary | ICD-10-CM | POA: Diagnosis not present

## 2016-10-20 DIAGNOSIS — F419 Anxiety disorder, unspecified: Secondary | ICD-10-CM

## 2016-10-20 LAB — URINALYSIS, ROUTINE W REFLEX MICROSCOPIC
Bilirubin Urine: NEGATIVE
Hgb urine dipstick: NEGATIVE
KETONES UR: NEGATIVE
LEUKOCYTES UA: NEGATIVE
NITRITE: NEGATIVE
PH: 7 (ref 5.0–8.0)
SPECIFIC GRAVITY, URINE: 1.015 (ref 1.000–1.030)
Total Protein, Urine: NEGATIVE
UROBILINOGEN UA: 0.2 (ref 0.0–1.0)
Urine Glucose: NEGATIVE

## 2016-10-20 LAB — CBC WITH DIFFERENTIAL/PLATELET
BASOS PCT: 0.8 % (ref 0.0–3.0)
Basophils Absolute: 0.1 10*3/uL (ref 0.0–0.1)
EOS PCT: 3.8 % (ref 0.0–5.0)
Eosinophils Absolute: 0.3 10*3/uL (ref 0.0–0.7)
HCT: 41.6 % (ref 36.0–46.0)
HEMOGLOBIN: 13.9 g/dL (ref 12.0–15.0)
Lymphocytes Relative: 24.5 % (ref 12.0–46.0)
Lymphs Abs: 1.9 10*3/uL (ref 0.7–4.0)
MCHC: 33.4 g/dL (ref 30.0–36.0)
MCV: 95.8 fl (ref 78.0–100.0)
MONOS PCT: 6.7 % (ref 3.0–12.0)
Monocytes Absolute: 0.5 10*3/uL (ref 0.1–1.0)
Neutro Abs: 5.1 10*3/uL (ref 1.4–7.7)
Neutrophils Relative %: 64.2 % (ref 43.0–77.0)
Platelets: 289 10*3/uL (ref 150.0–400.0)
RBC: 4.34 Mil/uL (ref 3.87–5.11)
RDW: 12.6 % (ref 11.5–15.5)
WBC: 7.9 10*3/uL (ref 4.0–10.5)

## 2016-10-20 LAB — HEMOGLOBIN A1C: HEMOGLOBIN A1C: 5.6 % (ref 4.6–6.5)

## 2016-10-20 MED ORDER — TRAZODONE HCL 50 MG PO TABS: 25.0000 mg | ORAL_TABLET | Freq: Every evening | ORAL | 1 refills | Status: DC | PRN

## 2016-10-20 MED ORDER — ESCITALOPRAM OXALATE 20 MG PO TABS: 20.0000 mg | ORAL_TABLET | Freq: Every day | ORAL | 3 refills | Status: DC

## 2016-10-20 MED ORDER — AMPHETAMINE-DEXTROAMPHETAMINE 10 MG PO TABS: 10.0000 mg | ORAL_TABLET | Freq: Two times a day (BID) | ORAL | 0 refills | Status: DC

## 2016-10-20 MED FILL — ESCITALOPRAM 20 MG TABLET: 20 | 30 days supply | Qty: 30 | Fill #0

## 2016-10-20 MED FILL — traZODone HCL 50 MG TABS: 50 | 30 days supply | Qty: 30 | Fill #0

## 2016-10-20 NOTE — Patient Instructions (Addendum)
You had the Tdap Tetanus shot  OK to increase the lexapro to 20 mg  Please take all new medication as prescribed  - the trazodone  Please continue all other medications as before, and refills have been done if requested- the adderall.  Please have the pharmacy call with any other refills you may need.  Please continue your efforts at being more active, low cholesterol diet, and weight control.  You are otherwise up to date with prevention measures today.  Please keep your appointments with your specialists as you may have planned  Please go to the LAB in the Basement (turn left off the elevator) for the tests to be done today  You will be contacted by phone if any changes need to be made immediately.  Otherwise, you will receive a letter about your results with an explanation, but please check with MyChart first.  Please remember to sign up for MyChart if you have not done so, as this will be important to you in the future with finding out test results, communicating by private email, and scheduling acute appointments online when needed.  Please return in 1 year for your yearly visit, or sooner if needed, with Lab testing done 3-5 days before

## 2016-10-20 NOTE — Progress Notes (Signed)
Subjective:    Patient ID: Aimee Thompson, female    DOB: 01/23/1971, 46 y.o.   MRN: [id number]  HPI  Here for wellness and f/u;  Overall doing ok;  Pt denies Chest pain, worsening SOB, DOE, wheezing, orthopnea, PND, worsening LE edema, palpitations, dizziness or syncope.  Pt denies neurological change such as new headache, facial or extremity weakness.  Pt denies polydipsia, polyuria   Pt states overall good compliance with treatment and medications, good tolerability, and has been trying to follow appropriate diet.  No fever, night sweats, wt loss, loss of appetite, or other constitutional symptoms.  Pt states good ability with ADL's, has low fall risk, home safety reviewed and adequate, no other significant changes in hearing or vision, and only occasionally active with exercise.  Has gained significant wt Wt Readings from Last 3 Encounters:  10/20/16 146 lb (66.2 kg)  10/28/15 145 lb (65.8 kg)  10/09/15 136 lb 9.6 oz (62 kg)   BP Readings from Last 3 Encounters:  10/20/16 118/78  10/28/15 122/80  10/09/15 107/67  Pt states has had mild worsening depressive symptoms with anxiety but Denies suicidal ideation, or panic; has ongoing anxiety, asks for increased lexapro.  ADD med seems to be working well and remains employed and social without significant problems.  Does have worsening insomnia as well, finds very difficult to get to sleep most nights  Does also have recurring fleeting pains to various locations in the abd but Denies worsening reflux, dysphagia, n/v, bowel change or blood. Past Medical History:  Diagnosis Date  . ADD (attention deficit disorder)   . ASTHMA   . HEART MURMUR, HX OF   . Hypertension   . Hypertriglyceridemia, familial    Past Surgical History:  Procedure Laterality Date  . CESAREAN SECTION  2011  . KNEE SURGERY  1990  . laproscopy    . SHOULDER SURGERY Right 2012    reports that she has never smoked. She has never used smokeless tobacco. She reports  that she drinks alcohol. She reports that she does not use drugs. family history includes Hypertension in her other. Allergies  Allergen Reactions  . Penicillins Hives    Has patient had a PCN reaction causing immediate rash, facial/tongue/throat swelling, SOB or lightheadedness with hypotension: no Has patient had a PCN reaction causing severe rash involving mucus membranes or skin necrosis: no Has patient had a PCN reaction that required hospitalization no Has patient had a PCN reaction occurring within the last 10 years: yes If all of the above answers are "NO", then may proceed with Cephalosporin use.  Caryl Never. Scallops [Shellfish Allergy] Itching and Other (See Comments)    Causes redness   Current Outpatient Prescriptions on File Prior to Visit  Medication Sig Dispense Refill  . albuterol (PROVENTIL HFA;VENTOLIN HFA) 108 (90 Base) MCG/ACT inhaler Inhale 2 puffs into the lungs every 6 (six) hours as needed for wheezing or shortness of breath. 1 Inhaler 5  . budesonide-formoterol (SYMBICORT) 160-4.5 MCG/ACT inhaler Inhale 2 puffs into the lungs 2 (two) times daily. For Shortness of breath (Patient taking differently: Inhale 2 puffs into the lungs 2 (two) times daily as needed (For shortness of breath.). ) 1 Inhaler 6  . fenofibrate 160 MG tablet Take 1 tablet (160 mg total) by mouth daily. 90 tablet 3  . ibuprofen (ADVIL,MOTRIN) 200 MG tablet Take 400 mg by mouth every 6 (six) hours as needed (For cramping.). Reported on 03/26/2015    . metoprolol succinate (TOPROL-XL) 25  MG 24 hr tablet Take 1 tablet (25 mg total) by mouth daily. Annual appt due in august must see MD for refills 90 tablet 0   No current facility-administered medications on file prior to visit.      Review of Systems  Constitutional: Negative for other unusual diaphoresis or sweats HENT: Negative for ear discharge or swelling Eyes: Negative for other worsening visual disturbances Respiratory: Negative for stridor or other  swelling  Gastrointestinal: Negative for worsening distension or other blood Genitourinary: Negative for retention or other urinary change Musculoskeletal: Negative for other MSK pain or swelling Skin: Negative for color change or other new lesions Neurological: Negative for worsening tremors and other numbness  Psychiatric/Behavioral: Negative for worsening agitation or other fatigue All other system neg per pt    Objective:   Physical Exam BP 118/78   Pulse 62   Ht 5\' 2"  (1.575 m)   Wt 146 lb (66.2 kg)   SpO2 98%   BMI 26.70 kg/m  VS noted,  Constitutional: Pt appears in NAD HENT: Head: NCAT.  Right Ear: External ear normal.  Left Ear: External ear normal.  Eyes: . Pupils are equal, round, and reactive to light. Conjunctivae and EOM are normal Nose: without d/c or deformity Neck: Neck supple. Gross normal ROM Cardiovascular: Normal rate and regular rhythm.   Pulmonary/Chest: Effort normal and breath sounds without rales or wheezing.  Abd:  Soft, NT, ND, + BS, no organomegaly Neurological: Pt is alert. At baseline orientation, motor grossly intact Skin: Skin is warm. No rashes, other new lesions, no LE edema Psychiatric: Pt behavior is normal without agitation , 1-2+ nervous/depressed affect No other exam findings Lab Results  Component Value Date   WBC 7.9 10/20/2016   HGB 13.9 10/20/2016   HCT 41.6 10/20/2016   PLT 289.0 10/20/2016   GLUCOSE 102 (H) 10/20/2016   CHOL 177 10/20/2016   TRIG 147.0 10/20/2016   HDL 67.50 10/20/2016   LDLDIRECT 110.7 06/01/2010   LDLCALC 80 10/20/2016   ALT 13 10/20/2016   AST 15 10/20/2016   NA 137 10/20/2016   K 3.7 10/20/2016   CL 101 10/20/2016   CREATININE 1.04 10/20/2016   BUN 14 10/20/2016   CO2 29 10/20/2016   TSH 2.59 10/20/2016   HGBA1C 5.6 10/20/2016      Assessment & Plan:

## 2016-10-21 ENCOUNTER — Ambulatory Visit: Payer: Self-pay | Admitting: Internal Medicine

## 2016-10-21 LAB — BASIC METABOLIC PANEL
BUN: 14 mg/dL (ref 6–23)
CALCIUM: 9.5 mg/dL (ref 8.4–10.5)
CO2: 29 mEq/L (ref 19–32)
Chloride: 101 mEq/L (ref 96–112)
Creatinine, Ser: 1.04 mg/dL (ref 0.40–1.20)
GFR: 60.53 mL/min (ref >60.00)
GLUCOSE: 102 mg/dL — AB (ref 70–99)
Potassium: 3.7 mEq/L (ref 3.5–5.1)
Sodium: 137 mEq/L (ref 135–145)

## 2016-10-21 LAB — LIPID PANEL
CHOL/HDL RATIO: 3
CHOLESTEROL: 177 mg/dL (ref 0–200)
HDL: 67.5 mg/dL (ref >39.00)
LDL CALC: 80 mg/dL (ref 0–99)
NonHDL: 109.17
Triglycerides: 147 mg/dL (ref 0.0–149.0)
VLDL: 29.4 mg/dL (ref 0.0–40.0)

## 2016-10-21 LAB — HEPATIC FUNCTION PANEL
ALBUMIN: 4.4 g/dL (ref 3.5–5.2)
ALK PHOS: 49 U/L (ref 39–117)
ALT: 13 U/L (ref 0–35)
AST: 15 U/L (ref 0–37)
BILIRUBIN TOTAL: 0.2 mg/dL (ref 0.2–1.2)
Bilirubin, Direct: 0 mg/dL (ref 0.0–0.3)
Total Protein: 7.4 g/dL (ref 6.0–8.3)

## 2016-10-21 LAB — LIPASE: Lipase: 8 U/L — ABNORMAL LOW (ref 11.0–59.0)

## 2016-10-21 LAB — TSH: TSH: 2.59 u[IU]/mL (ref 0.35–4.50)

## 2016-10-22 NOTE — Assessment & Plan Note (Signed)
stable overall by history and exam, recent data reviewed with pt, and pt to continue medical treatment as before,  to f/u any worsening symptoms or concerns BP Readings from Last 3 Encounters:  10/20/16 118/78  10/28/15 122/80  10/09/15 107/67

## 2016-10-22 NOTE — Assessment & Plan Note (Addendum)
Asymptomatic, for a1c with labs,  to f/u any worsening symptoms or concerns  In addition to the time spent performing CPE, I spent an additional 25 minutes face to face,in which greater than 50% of this time was spent in counseling and coordination of care for patient's acute illness as documented., including the differential dx, and any further eval and tx of hyperglycemia, HTN management, ADD, depression and abd pain.

## 2016-10-22 NOTE — Assessment & Plan Note (Signed)
Stable, for contd med refill,  to f/u any worsening symptoms or concerns

## 2016-10-22 NOTE — Assessment & Plan Note (Signed)

## 2016-10-22 NOTE — Assessment & Plan Note (Signed)
Pt requests lipase with labs as although she has intermittent abd pains, it does not seem overly suggestive of recurring pancreatitis

## 2016-10-22 NOTE — Assessment & Plan Note (Signed)
Mild worsening per pt, no SI or HI, for increased lexapro 20 qd, declines counseling referral for now

## 2016-11-26 MED FILL — ESCITALOPRAM 20 MG TABLET: 20 | 30 days supply | Qty: 30 | Fill #1

## 2016-11-28 MED FILL — traZODone HCL 50 MG TABS: 50 | 30 days supply | Qty: 30 | Fill #1

## 2016-12-29 MED FILL — traZODone HCL 50 MG TABS: 50 | 30 days supply | Qty: 30 | Fill #2

## 2016-12-29 MED FILL — ESCITALOPRAM 20 MG TABLET: 20 | 30 days supply | Qty: 30 | Fill #2

## 2017-04-07 MED FILL — ESCITALOPRAM 20 MG TABLET: 20 | 30 days supply | Qty: 30 | Fill #3

## 2017-07-28 ENCOUNTER — Other Ambulatory Visit: Payer: Self-pay | Admitting: Internal Medicine

## 2017-07-28 MED FILL — METOPROLOL SUCCINATE ER 25: 25 | 30 days supply | Qty: 30 | Fill #2

## 2017-07-28 MED FILL — ALPRAZolam 0.25 MG TABS: 0.25 | 30 days supply | Qty: 60 | Fill #0

## 2017-07-28 MED FILL — ESCITALOPRAM 20 MG TABLET: 20 | 30 days supply | Qty: 30 | Fill #4

## 2017-07-28 NOTE — Telephone Encounter (Signed)
Done erx 

## 2017-11-06 ENCOUNTER — Other Ambulatory Visit: Payer: Self-pay | Admitting: Internal Medicine

## 2017-11-06 MED FILL — traZODone HCL 50 MG TABS: 50 | 90 days supply | Qty: 90 | Fill #0

## 2017-11-06 MED FILL — ESCITALOPRAM 20 MG TABLET: 20 | 90 days supply | Qty: 90 | Fill #0

## 2017-11-06 MED FILL — ALPRAZolam 0.25 MG TABS: 0.25 | 30 days supply | Qty: 60 | Fill #1

## 2017-11-21 ENCOUNTER — Encounter: Payer: Self-pay | Admitting: Internal Medicine

## 2017-11-21 ENCOUNTER — Ambulatory Visit: Payer: Self-pay | Admitting: Internal Medicine

## 2017-11-21 ENCOUNTER — Ambulatory Visit: Payer: BLUE CROSS/BLUE SHIELD | Admitting: Internal Medicine

## 2017-11-21 VITALS — BP 148/96 | HR 60 | Temp 98.3°F | Ht 62.0 in | Wt 173.0 lb

## 2017-11-21 DIAGNOSIS — F1090 Alcohol use, unspecified, uncomplicated: Secondary | ICD-10-CM | POA: Insufficient documentation

## 2017-11-21 DIAGNOSIS — Z789 Other specified health status: Secondary | ICD-10-CM

## 2017-11-21 DIAGNOSIS — I10 Essential (primary) hypertension: Secondary | ICD-10-CM

## 2017-11-21 DIAGNOSIS — Z0000 Encounter for general adult medical examination without abnormal findings: Secondary | ICD-10-CM

## 2017-11-21 DIAGNOSIS — R739 Hyperglycemia, unspecified: Secondary | ICD-10-CM | POA: Diagnosis not present

## 2017-11-21 DIAGNOSIS — Z23 Encounter for immunization: Secondary | ICD-10-CM | POA: Diagnosis not present

## 2017-11-21 DIAGNOSIS — Z7289 Other problems related to lifestyle: Secondary | ICD-10-CM | POA: Insufficient documentation

## 2017-11-21 MED ORDER — METOPROLOL SUCCINATE ER 50 MG PO TB24: 50.0000 mg | ORAL_TABLET | Freq: Every day | ORAL | 3 refills | Status: DC

## 2017-11-21 MED ORDER — LOSARTAN POTASSIUM 100 MG PO TABS: 100.0000 mg | ORAL_TABLET | Freq: Every day | ORAL | 3 refills | Status: DC

## 2017-11-21 MED FILL — METOPROLOL SUCCINATE ER 50: 50 | 30 days supply | Qty: 30 | Fill #0

## 2017-11-21 MED FILL — LOSARTAN POTASSIUM 100 MG T: 100 | 30 days supply | Qty: 30 | Fill #0

## 2017-11-21 NOTE — Assessment & Plan Note (Signed)

## 2017-11-21 NOTE — Progress Notes (Signed)
Subjective:    Patient ID: Aimee Thompson, female    DOB: 11-Dec-1970, 47 y.o.   MRN: [id number]  HPI  Here for wellness and f/u;  Overall doing ok;  Pt denies Chest pain, worsening SOB, DOE, wheezing, orthopnea, PND, worsening LE edema, palpitations, dizziness or syncope.  Pt denies neurological change such as new headache, facial or extremity weakness.  Pt denies polydipsia, polyuria, or low sugar symptoms. Pt states overall good compliance with treatment and medications, good tolerability, and has been trying to follow appropriate diet.  Pt denies worsening depressive symptoms, suicidal ideation or panic. No fever, night sweats, wt loss, loss of appetite, or other constitutional symptoms.  Pt states good ability with ADL's, has low fall risk, home safety reviewed and adequate, no other significant changes in hearing or vision, and not active with exercise.  Trying to keep up with 3 children.  Admits to increased ETOH use, wt gain and increased BP Wt Readings from Last 3 Encounters:  11/21/17 173 lb (78.5 kg)  10/20/16 146 lb (66.2 kg)  10/28/15 145 lb (65.8 kg)   Past Medical History:  Diagnosis Date  . ADD (attention deficit disorder)   . ASTHMA   . HEART MURMUR, HX OF   . Hypertension   . Hypertriglyceridemia, familial    Past Surgical History:  Procedure Laterality Date  . CESAREAN SECTION  2011  . KNEE SURGERY  1990  . laproscopy    . SHOULDER SURGERY Right 2012    reports that she has never smoked. She has never used smokeless tobacco. She reports that she drinks alcohol. She reports that she does not use drugs. family history includes Hypertension in her other. Allergies  Allergen Reactions  . Penicillins Hives    Has patient had a PCN reaction causing immediate rash, facial/tongue/throat swelling, SOB or lightheadedness with hypotension: no Has patient had a PCN reaction causing severe rash involving mucus membranes or skin necrosis: no Has patient had a PCN reaction  that required hospitalization no Has patient had a PCN reaction occurring within the last 10 years: yes If all of the above answers are "NO", then may proceed with Cephalosporin use.  Caryl Never [Shellfish Allergy] Itching and Other (See Comments)    Causes redness   Current Outpatient Medications on File Prior to Visit  Medication Sig Dispense Refill  . albuterol (PROVENTIL HFA;VENTOLIN HFA) 108 (90 Base) MCG/ACT inhaler Inhale 2 puffs into the lungs every 6 (six) hours as needed for wheezing or shortness of breath. 1 Inhaler 5  . ALPRAZolam (XANAX) 0.25 MG tablet TAKE 1 TABLET BY MOUTH TWICE A DAY AS NEEDED FOR ANXIETY 60 tablet 3  . amphetamine-dextroamphetamine (ADDERALL) 10 MG tablet Take 1 tablet (10 mg total) by mouth 2 (two) times daily. 60 tablet 0  . budesonide-formoterol (SYMBICORT) 160-4.5 MCG/ACT inhaler Inhale 2 puffs into the lungs 2 (two) times daily. For Shortness of breath (Patient taking differently: Inhale 2 puffs into the lungs 2 (two) times daily as needed (For shortness of breath.). ) 1 Inhaler 6  . escitalopram (LEXAPRO) 20 MG tablet Take 1 tablet (20 mg total) by mouth daily. **APPOINTMENT NEEDED FOR ADDITIONAL REFILLS** 90 tablet 0  . fenofibrate 160 MG tablet Take 1 tablet (160 mg total) by mouth daily. 90 tablet 3  . ibuprofen (ADVIL,MOTRIN) 200 MG tablet Take 400 mg by mouth every 6 (six) hours as needed (For cramping.). Reported on 03/26/2015    . traZODone (DESYREL) 50 MG tablet Take 0.5-1 tablets (  25-50 mg total) by mouth at bedtime as needed. for sleep **APPOINTMENT NEEDED FOR ADDITIONAL REFILLS** 90 tablet 0   No current facility-administered medications on file prior to visit.    Review of Systems Constitutional: Negative for other unusual diaphoresis, sweats, appetite or weight changes HENT: Negative for other worsening hearing loss, ear pain, facial swelling, mouth sores or neck stiffness.   Eyes: Negative for other worsening pain, redness or other visual  disturbance.  Respiratory: Negative for other stridor or swelling Cardiovascular: Negative for other palpitations or other chest pain  Gastrointestinal: Negative for worsening diarrhea or loose stools, blood in stool, distention or other pain Genitourinary: Negative for hematuria, flank pain or other change in urine volume.  Musculoskeletal: Negative for myalgias or other joint swelling.  Skin: Negative for other color change, or other wound or worsening drainage.  Neurological: Negative for other syncope or numbness. Hematological: Negative for other adenopathy or swelling Psychiatric/Behavioral: Negative for hallucinations, other worsening agitation, SI, self-injury, or new decreased concentration\ALl other system neg pe rpt    Objective:   Physical Exam BP (!) 148/96   Pulse 60   Temp 98.3 F (36.8 C) (Oral)   Ht 5\' 2"  (1.575 m)   Wt 173 lb (78.5 kg)   SpO2 97%   BMI 31.64 kg/m  VS noted,  Constitutional: Pt is oriented to person, place, and time. Appears well-developed and well-nourished, in no significant distress and comfortable Head: Normocephalic and atraumatic  Eyes: Conjunctivae and EOM are normal. Pupils are equal, round, and reactive to light Right Ear: External ear normal without discharge Left Ear: External ear normal without discharge Nose: Nose without discharge or deformity Mouth/Throat: Oropharynx is without other ulcerations and moist  Neck: Normal range of motion. Neck supple. No JVD present. No tracheal deviation present or significant neck LA or mass Cardiovascular: Normal rate, regular rhythm, normal heart sounds and intact distal pulses.   Pulmonary/Chest: WOB normal and breath sounds without rales or wheezing  Abdominal: Soft. Bowel sounds are normal. NT. No HSM  Musculoskeletal: Normal range of motion. Exhibits no edema Lymphadenopathy: Has no other cervical adenopathy.  Neurological: Pt is alert and oriented to person, place, and time. Pt has normal  reflexes. No cranial nerve deficit. Motor grossly intact, Gait intact Skin: Skin is warm and dry. No rash noted or new ulcerations Psychiatric:  Has normal mood and affect. Behavior is normal without agitatio, not depressed affect Lab Results  Component Value Date   WBC 7.9 10/20/2016   HGB 13.9 10/20/2016   HCT 41.6 10/20/2016   PLT 289.0 10/20/2016   GLUCOSE 102 (H) 10/20/2016   CHOL 177 10/20/2016   TRIG 147.0 10/20/2016   HDL 67.50 10/20/2016   LDLDIRECT 110.7 06/01/2010   LDLCALC 80 10/20/2016   ALT 13 10/20/2016   AST 15 10/20/2016   NA 137 10/20/2016   K 3.7 10/20/2016   CL 101 10/20/2016   CREATININE 1.04 10/20/2016   BUN 14 10/20/2016   CO2 29 10/20/2016   TSH 2.59 10/20/2016   HGBA1C 5.6 10/20/2016        Assessment & Plan:

## 2017-11-21 NOTE — Assessment & Plan Note (Signed)
stable overall by history and exam, recent data reviewed with pt, and pt to continue medical treatment as before,  to f/u any worsening symptoms or concerns  

## 2017-11-21 NOTE — Telephone Encounter (Signed)
FYI

## 2017-11-21 NOTE — Telephone Encounter (Signed)
Patient is calling to report that she is having elevation in her BP- she is not feeling well and reports having headache. Call to office- per [facility]- OK to schedule.  Reason for Disposition . [1] Systolic BP  >= 160 OR Diastolic >= 100 AND [2] cardiac or neurologic symptoms (e.g., chest pain, difficulty breathing, unsteady gait, blurred vision)    Patient reports headache- not feeling well- does not want to go to ED- call to office- ok to schedule per [facility]  Answer Assessment - Initial Assessment Questions 1. BLOOD PRESSURE: "What is the blood pressure?" "Did you take at least two measurements 5 minutes apart?"     161/93      154/98 2. ONSET: "When did you take your blood pressure?"     8:00, 8:22 3. HOW: "How did you obtain the blood pressure?" (e.g., visiting nurse, automatic home BP monitor)     Automatic cuff 4. HISTORY: "Do you have a history of high blood pressure?"     yes 5. MEDICATIONS: "Are you taking any medications for blood pressure?" "Have you missed any doses recently?"     Yes, no missed doses 6. OTHER SYMPTOMS: "Do you have any symptoms?" (e.g., headache, chest pain, blurred vision, difficulty breathing, weakness)     Headache today. Dizzy and nausea over the weekend- patient states she doesn't feel good. 7. PREGNANCY: "Is there any chance you are pregnant?" "When was your last menstrual period?"     n/a, LMP- now  Protocols used: HIGH BLOOD PRESSURE-A-AH

## 2017-11-21 NOTE — Assessment & Plan Note (Signed)
Urged to quit 

## 2017-11-21 NOTE — Patient Instructions (Addendum)
You had the flu shot today  Ok to increase the toprol xl to 50 mg per day  Please take all new medication as prescribed - the losartan 100 mg per day  Please cut back or stop alcohol use  Please continue all other medications as before, and refills have been done if requested.  Please have the pharmacy call with any other refills you may need.  Please continue your efforts at being more active, low cholesterol diet, and weight control.  You are otherwise up to date with prevention measures today.  Please keep your appointments with your specialists as you may have planned - the counseling  Please go to the LAB in the Basement (turn left off the elevator) for the tests to be done today  You will be contacted by phone if any changes need to be made immediately.  Otherwise, you will receive a letter about your results with an explanation, but please check with MyChart first.  Please remember to sign up for MyChart if you have not done so, as this will be important to you in the future with finding out test results, communicating by private email, and scheduling acute appointments online when needed.  Please return in 1 year for your yearly visit, or sooner if needed, with Lab testing done 3-5 days before

## 2017-11-21 NOTE — Assessment & Plan Note (Addendum)
Uncontrolled, to increased the toprol xl to 50, also start losartan 100 qd, work on wt control and d/c ETOH

## 2017-12-18 MED FILL — METOPROLOL SUCCINATE ER 50: 50 | 30 days supply | Qty: 30 | Fill #1

## 2017-12-18 MED FILL — LOSARTAN POTASSIUM 100 MG T: 100 | 30 days supply | Qty: 30 | Fill #1

## 2018-03-13 ENCOUNTER — Other Ambulatory Visit: Payer: Self-pay | Admitting: Internal Medicine

## 2018-03-13 MED FILL — LOSARTAN POTASSIUM 100 MG T: 100 | 30 days supply | Qty: 30 | Fill #2

## 2018-03-15 MED FILL — ESCITALOPRAM 20 MG TABLET: 20 | 30 days supply | Qty: 30 | Fill #0

## 2018-03-15 MED FILL — METOPROLOL SUCCINATE ER 25: 25 | 30 days supply | Qty: 30 | Fill #0

## 2018-05-17 ENCOUNTER — Ambulatory Visit: Payer: 59 | Admitting: Psychology

## 2018-05-17 DIAGNOSIS — F4323 Adjustment disorder with mixed anxiety and depressed mood: Secondary | ICD-10-CM | POA: Diagnosis not present

## 2018-05-29 ENCOUNTER — Ambulatory Visit: Payer: 59 | Admitting: Psychology

## 2018-05-29 DIAGNOSIS — F4323 Adjustment disorder with mixed anxiety and depressed mood: Secondary | ICD-10-CM

## 2018-06-15 ENCOUNTER — Ambulatory Visit: Payer: 59 | Admitting: Psychology

## 2018-08-27 ENCOUNTER — Ambulatory Visit (INDEPENDENT_AMBULATORY_CARE_PROVIDER_SITE_OTHER): Payer: 59 | Admitting: Psychology

## 2018-08-27 DIAGNOSIS — F4323 Adjustment disorder with mixed anxiety and depressed mood: Secondary | ICD-10-CM

## 2018-09-03 ENCOUNTER — Ambulatory Visit: Payer: 59 | Admitting: Psychology

## 2018-09-10 ENCOUNTER — Ambulatory Visit (INDEPENDENT_AMBULATORY_CARE_PROVIDER_SITE_OTHER): Payer: 59 | Admitting: Psychology

## 2018-09-10 DIAGNOSIS — F4323 Adjustment disorder with mixed anxiety and depressed mood: Secondary | ICD-10-CM

## 2018-09-14 ENCOUNTER — Ambulatory Visit (INDEPENDENT_AMBULATORY_CARE_PROVIDER_SITE_OTHER): Payer: 59 | Admitting: Psychology

## 2018-09-14 DIAGNOSIS — F4323 Adjustment disorder with mixed anxiety and depressed mood: Secondary | ICD-10-CM

## 2018-10-08 ENCOUNTER — Other Ambulatory Visit: Payer: Self-pay

## 2018-10-08 ENCOUNTER — Emergency Department (HOSPITAL_COMMUNITY)
Admission: EM | Admit: 2018-10-08 | Discharge: 2018-10-08 | Disposition: A | Discharge status: Home / Self Care | Payer: BLUE CROSS/BLUE SHIELD | Attending: Emergency Medicine | Admitting: Emergency Medicine

## 2018-10-08 ENCOUNTER — Encounter (HOSPITAL_COMMUNITY): Payer: Self-pay | Admitting: Emergency Medicine

## 2018-10-08 DIAGNOSIS — Z79899 Other long term (current) drug therapy: Secondary | ICD-10-CM | POA: Insufficient documentation

## 2018-10-08 DIAGNOSIS — J45909 Unspecified asthma, uncomplicated: Secondary | ICD-10-CM | POA: Insufficient documentation

## 2018-10-08 DIAGNOSIS — Z0489 Encounter for examination and observation for other specified reasons: Secondary | ICD-10-CM | POA: Insufficient documentation

## 2018-10-08 DIAGNOSIS — I10 Essential (primary) hypertension: Secondary | ICD-10-CM | POA: Insufficient documentation

## 2018-10-08 DIAGNOSIS — Z139 Encounter for screening, unspecified: Secondary | ICD-10-CM

## 2018-10-08 HISTORY — DX: Alcohol dependence, uncomplicated: F10.20

## 2018-10-08 LAB — CBC
HCT: 40.4 % (ref 36.0–46.0)
Hemoglobin: 13.8 g/dL (ref 12.0–15.0)
MCH: 32.6 pg (ref 26.0–34.0)
MCHC: 34.2 g/dL (ref 30.0–36.0)
MCV: 95.5 fL (ref 80.0–100.0)
Platelets: 303 10*3/uL (ref 150–400)
RBC: 4.23 MIL/uL (ref 3.87–5.11)
RDW: 13.9 % (ref 11.5–15.5)
WBC: 8.4 10*3/uL (ref 4.0–10.5)
nRBC: 0 % (ref 0.0–0.2)

## 2018-10-08 LAB — COMPREHENSIVE METABOLIC PANEL
ALT: 23 U/L (ref 0–44)
AST: 39 U/L (ref 15–41)
Albumin: 4.2 g/dL (ref 3.5–5.0)
Alkaline Phosphatase: 65 U/L (ref 38–126)
Anion gap: 14 (ref 5–15)
BUN: 10 mg/dL (ref 6–20)
CO2: 22 mmol/L (ref 22–32)
Calcium: 8.8 mg/dL — ABNORMAL LOW (ref 8.9–10.3)
Chloride: 104 mmol/L (ref 98–111)
Creatinine, Ser: 0.83 mg/dL (ref 0.44–1.00)
GFR calc Af Amer: >60 mL/min (ref >60)
GFR calc non Af Amer: >60 mL/min (ref >60)
Glucose, Bld: 109 mg/dL — ABNORMAL HIGH (ref 70–99)
Potassium: 3.6 mmol/L (ref 3.5–5.1)
Sodium: 140 mmol/L (ref 135–145)
Total Bilirubin: 0.5 mg/dL (ref 0.3–1.2)
Total Protein: 7.2 g/dL (ref 6.5–8.1)

## 2018-10-08 LAB — I-STAT BETA HCG BLOOD, ED (MC, WL, AP ONLY): I-stat hCG, quantitative: <5 m[IU]/mL (ref <5)

## 2018-10-08 NOTE — ED Notes (Signed)
Patient verbalizes understanding of discharge instructions. Opportunity for questioning and answers were provided. Armband removed by staff, pt discharged from ED in police custody.  °

## 2018-10-08 NOTE — ED Provider Notes (Signed)
[facility] EMERGENCY DEPARTMENT Provider Note   CSN: [id number] Arrival date & time: 10/08/18  1420    History   Chief Complaint Chief Complaint  Patient presents with  . Medical Clearance for Jail    HPI Aimee Thompson is a 48 y.o. female.     HPI   48 year old female presents today under arrest with [employer]  They report that they were in route to the hospital as standard procedure.  They noted that the procedures has changed and the patient does not need medical clearance but they are already at the hospital.  Patient had already checked in and labs have been ordered.  Patient denies any physical complaints presently.  Past Medical History:  Diagnosis Date  . ADD (attention deficit disorder)   . ASTHMA   . EtOH dependence ([place])   . HEART MURMUR, HX OF   . Hypertension   . Hypertriglyceridemia, familial     Patient Active Problem List   Diagnosis Date Noted  . Alcohol use 11/21/2017  . Hyperglycemia 10/20/2016  . Acute pancreatic necrosis   . Distended abdomen   . Hypertriglyceridemia 10/01/2015  . Acute pancreatitis 09/30/2015  . Abdominal pain 03/26/2015  . Hematochezia 03/26/2015  . Right hand pain 07/01/2014  . Acute sinus infection 01/24/2013  . Other malaise and fatigue 10/31/2012  . Night sweats 10/31/2012  . Essential hypertension, benign 10/31/2012  . Anxiety and depression 07/11/2011  . Vertigo 08/09/2010  . ADD (attention deficit disorder) 08/09/2010  . Preventative health care 08/06/2010  . Asthma 06/01/2010  . WEIGHT LOSS 06/03/2009  . HEART MURMUR, HX OF 11/03/2006    Past Surgical History:  Procedure Laterality Date  . CESAREAN SECTION  2011  . KNEE SURGERY  1990  . laproscopy    . SHOULDER SURGERY Right 2012     OB History   No obstetric history on file.      Home Medications    Prior to Admission medications   Medication Sig Start Date End Date Taking? Authorizing Provider   albuterol (PROVENTIL HFA;VENTOLIN HFA) 108 (90 Base) MCG/ACT inhaler Inhale 2 puffs into the lungs every 6 (six) hours as needed for wheezing or shortness of breath. 03/26/15  Yes Biagio Borg, MD  budesonide-formoterol [facility]) 160-4.5 MCG/ACT inhaler Inhale 2 puffs into the lungs 2 (two) times daily. For Shortness of breath Patient taking differently: Inhale 2 puffs into the lungs 2 (two) times daily as needed (For shortness of breath.).  06/04/13  Yes Biagio Borg, MD  escitalopram (LEXAPRO) 20 MG tablet TAKE 1 TABLET BY MOUTH ONCE DAILY 03/15/18  Yes Biagio Borg, MD  fenofibrate 160 MG tablet Take 1 tablet (160 mg total) by mouth daily. 12/16/15  Yes Biagio Borg, MD  ibuprofen (ADVIL,MOTRIN) 200 MG tablet Take 400 mg by mouth every 6 (six) hours as needed (For cramping.). Reported on 03/26/2015   Yes [provider]  losartan (COZAAR) 100 MG tablet Take 1 tablet (100 mg total) by mouth daily. 11/21/17  Yes Biagio Borg, MD  ALPRAZolam Duanne Moron) 0.25 MG tablet TAKE 1 TABLET BY MOUTH TWICE A DAY AS NEEDED FOR ANXIETY 07/28/17   Biagio Borg, MD  amphetamine-dextroamphetamine (ADDERALL) 10 MG tablet Take 1 tablet (10 mg total) by mouth 2 (two) times daily. 10/20/16   Biagio Borg, MD  metoprolol succinate (TOPROL-XL) 25 MG 24 hr tablet Take 1 tablet (25 mg total) by mouth daily. ** DO NOT CRUSH **    (  BETA BLOCKER) 03/15/18   Corwin LevinsJohn, James W, MD  metoprolol succinate (TOPROL-XL) 50 MG 24 hr tablet Take 1 tablet (50 mg total) by mouth daily. Take with or immediately following a meal. 11/21/17   Corwin LevinsJohn, James W, MD  traZODone (DESYREL) 50 MG tablet Take 0.5-1 tablets (25-50 mg total) by mouth at bedtime as needed. for sleep **APPOINTMENT NEEDED FOR ADDITIONAL REFILLS** 11/06/17   Corwin LevinsJohn, James W, MD    Family History Family History  Problem Relation Age of Onset  . Hypertension Other   . Colon cancer Neg Hx   . Esophageal cancer Neg Hx   . Pancreatic cancer Neg Hx   . Colitis Neg Hx   . Crohn's  disease Neg Hx   . Kidney disease Neg Hx   . Liver disease Neg Hx   . Heart disease Neg Hx   . Stomach cancer Neg Hx     Social History Social History   Tobacco Use  . Smoking status: Never Smoker  . Smokeless tobacco: Never Used  Substance Use Topics  . Alcohol use: Yes    Alcohol/week: 0.0 standard drinks    Comment: 5 bottles of wine a week  . Drug use: No     Allergies   Penicillins and Scallops [shellfish allergy]   Review of Systems Review of Systems  All other systems reviewed and are negative.   Physical Exam Updated Vital Signs BP 112/88 (BP Location: Left Arm)   Pulse 79   Temp 98.9 F (37.2 C) (Oral)   Resp 14   Ht 5' 2.5" (1.588 m)   Wt 67.1 kg   LMP 09/25/2018 (Exact Date)   SpO2 100%   BMI 26.64 kg/m   Physical Exam Vitals signs and nursing note reviewed.  Constitutional:      Appearance: She is well-developed.  HENT:     Head: Normocephalic and atraumatic.  Eyes:     General: No scleral icterus.       Right eye: No discharge.        Left eye: No discharge.     Conjunctiva/sclera: Conjunctivae normal.     Pupils: Pupils are equal, round, and reactive to light.  Neck:     Musculoskeletal: Normal range of motion.     Vascular: No JVD.     Trachea: No tracheal deviation.  Pulmonary:     Effort: Pulmonary effort is normal.     Breath sounds: No stridor.  Neurological:     Mental Status: She is alert and oriented to person, place, and time.     Coordination: Coordination normal.  Psychiatric:        Behavior: Behavior normal.        Thought Content: Thought content normal.        Judgment: Judgment normal.      ED Treatments / Results  Labs (all labs ordered are listed, but only abnormal results are displayed) Labs Reviewed  COMPREHENSIVE METABOLIC PANEL - Abnormal; Notable for the following components:      Result Value   Glucose, Bld 109 (*)    Calcium 8.8 (*)    All other components within normal limits  CBC  RAPID URINE  DRUG SCREEN, HOSP PERFORMED  I-STAT BETA HCG BLOOD, ED ([facility], [facility], [facility] ONLY)    EKG None  Radiology No results found.  Procedures Procedures (including critical care time)  Medications Ordered in ED Medications - No data to display   Initial Impression / Assessment and Plan / ED Course  I have reviewed the triage vital signs and the nursing notes.  Pertinent labs & imaging results that were available during my care of the patient were reviewed by me and considered in my medical decision making (see chart for details).        48 year old female presents today with [employer].  Originally there intention was for medical clearance but realized that they did not require medical clearance on this individual.  She is in the exam room in no acute distress, she has no complaints.  No need for further evaluation or management here in the ED setting.  She is discharged at this time.  Final Clinical Impressions(s) / ED Diagnoses   Final diagnoses:  Encounter for medical screening examination    ED Discharge Orders    None       Rosalio LoudHedges, Herman Fiero, PA-C 10/08/18 1627    Terrilee FilesButler, Michael C, MD 10/09/18 [phone]

## 2018-10-08 NOTE — ED Triage Notes (Signed)
Pt presents today with [employer] for alcohol intoxication. Pt is under arrest for DUI and [employer] needs medical clearance for jail.

## 2018-10-24 ENCOUNTER — Emergency Department (HOSPITAL_COMMUNITY): Payer: BLUE CROSS/BLUE SHIELD

## 2018-10-24 ENCOUNTER — Encounter (HOSPITAL_COMMUNITY): Payer: Self-pay

## 2018-10-24 ENCOUNTER — Emergency Department (HOSPITAL_COMMUNITY)
Admission: EM | Admit: 2018-10-24 | Discharge: 2018-10-25 | Disposition: A | Discharge status: Other Acute Inpatient Hospital | Payer: BLUE CROSS/BLUE SHIELD | Source: Home / Self Care | Attending: Emergency Medicine | Admitting: Emergency Medicine

## 2018-10-24 ENCOUNTER — Other Ambulatory Visit: Payer: Self-pay

## 2018-10-24 DIAGNOSIS — Z91013 Allergy to seafood: Secondary | ICD-10-CM | POA: Insufficient documentation

## 2018-10-24 DIAGNOSIS — Z20828 Contact with and (suspected) exposure to other viral communicable diseases: Secondary | ICD-10-CM | POA: Insufficient documentation

## 2018-10-24 DIAGNOSIS — I10 Essential (primary) hypertension: Secondary | ICD-10-CM | POA: Insufficient documentation

## 2018-10-24 DIAGNOSIS — T50902A Poisoning by unspecified drugs, medicaments and biological substances, intentional self-harm, initial encounter: Secondary | ICD-10-CM

## 2018-10-24 DIAGNOSIS — J45909 Unspecified asthma, uncomplicated: Secondary | ICD-10-CM | POA: Insufficient documentation

## 2018-10-24 DIAGNOSIS — Z880 Allergy status to penicillin: Secondary | ICD-10-CM | POA: Insufficient documentation

## 2018-10-24 DIAGNOSIS — E781 Pure hyperglyceridemia: Secondary | ICD-10-CM | POA: Insufficient documentation

## 2018-10-24 DIAGNOSIS — F332 Major depressive disorder, recurrent severe without psychotic features: Secondary | ICD-10-CM | POA: Insufficient documentation

## 2018-10-24 DIAGNOSIS — R45851 Suicidal ideations: Secondary | ICD-10-CM

## 2018-10-24 DIAGNOSIS — Y908 Blood alcohol level of 240 mg/100 ml or more: Secondary | ICD-10-CM | POA: Insufficient documentation

## 2018-10-24 DIAGNOSIS — F1024 Alcohol dependence with alcohol-induced mood disorder: Secondary | ICD-10-CM | POA: Insufficient documentation

## 2018-10-24 DIAGNOSIS — R4182 Altered mental status, unspecified: Secondary | ICD-10-CM | POA: Insufficient documentation

## 2018-10-24 DIAGNOSIS — Z79899 Other long term (current) drug therapy: Secondary | ICD-10-CM | POA: Insufficient documentation

## 2018-10-24 DIAGNOSIS — F1010 Alcohol abuse, uncomplicated: Secondary | ICD-10-CM

## 2018-10-24 DIAGNOSIS — R011 Cardiac murmur, unspecified: Secondary | ICD-10-CM | POA: Insufficient documentation

## 2018-10-24 LAB — CBC WITH DIFFERENTIAL/PLATELET
Abs Immature Granulocytes: 0.07 10*3/uL (ref 0.00–0.07)
Basophils Absolute: 0.2 10*3/uL — ABNORMAL HIGH (ref 0.0–0.1)
Basophils Relative: 2 %
Eosinophils Absolute: 0 10*3/uL (ref 0.0–0.5)
Eosinophils Relative: 0 %
HCT: 39 % (ref 36.0–46.0)
Hemoglobin: 13.1 g/dL (ref 12.0–15.0)
Immature Granulocytes: 1 %
Lymphocytes Relative: 28 %
Lymphs Abs: 2.5 10*3/uL (ref 0.7–4.0)
MCH: 32.7 pg (ref 26.0–34.0)
MCHC: 33.6 g/dL (ref 30.0–36.0)
MCV: 97.3 fL (ref 80.0–100.0)
Monocytes Absolute: 0.3 10*3/uL (ref 0.1–1.0)
Monocytes Relative: 3 %
Neutro Abs: 5.9 10*3/uL (ref 1.7–7.7)
Neutrophils Relative %: 66 %
Platelets: 270 10*3/uL (ref 150–400)
RBC: 4.01 MIL/uL (ref 3.87–5.11)
RDW: 14.2 % (ref 11.5–15.5)
WBC: 8.9 10*3/uL (ref 4.0–10.5)
nRBC: 0 % (ref 0.0–0.2)

## 2018-10-24 LAB — LIPASE, BLOOD: Lipase: 22 U/L (ref 11–51)

## 2018-10-24 LAB — COMPREHENSIVE METABOLIC PANEL
ALT: 19 U/L (ref 0–44)
AST: 28 U/L (ref 15–41)
Albumin: 4 g/dL (ref 3.5–5.0)
Alkaline Phosphatase: 50 U/L (ref 38–126)
Anion gap: 18 — ABNORMAL HIGH (ref 5–15)
BUN: 9 mg/dL (ref 6–20)
CO2: 21 mmol/L — ABNORMAL LOW (ref 22–32)
Calcium: 8.2 mg/dL — ABNORMAL LOW (ref 8.9–10.3)
Chloride: 103 mmol/L (ref 98–111)
Creatinine, Ser: 0.75 mg/dL (ref 0.44–1.00)
GFR calc Af Amer: >60 mL/min (ref >60)
GFR calc non Af Amer: >60 mL/min (ref >60)
Glucose, Bld: 108 mg/dL — ABNORMAL HIGH (ref 70–99)
Potassium: 3.7 mmol/L (ref 3.5–5.1)
Sodium: 142 mmol/L (ref 135–145)
Total Bilirubin: 0.2 mg/dL — ABNORMAL LOW (ref 0.3–1.2)
Total Protein: 6.8 g/dL (ref 6.5–8.1)

## 2018-10-24 LAB — URINALYSIS, ROUTINE W REFLEX MICROSCOPIC
Bilirubin Urine: NEGATIVE
Glucose, UA: NEGATIVE mg/dL
Hgb urine dipstick: NEGATIVE
Ketones, ur: NEGATIVE mg/dL
Leukocytes,Ua: NEGATIVE
Nitrite: NEGATIVE
Protein, ur: NEGATIVE mg/dL
Specific Gravity, Urine: 1.005 (ref 1.005–1.030)
pH: 6 (ref 5.0–8.0)

## 2018-10-24 LAB — I-STAT BETA HCG BLOOD, ED (MC, WL, AP ONLY): I-stat hCG, quantitative: <5 m[IU]/mL (ref <5)

## 2018-10-24 LAB — RAPID URINE DRUG SCREEN, HOSP PERFORMED
Amphetamines: NOT DETECTED
Barbiturates: NOT DETECTED
Benzodiazepines: NOT DETECTED
Cocaine: NOT DETECTED
Opiates: NOT DETECTED
Tetrahydrocannabinol: NOT DETECTED

## 2018-10-24 LAB — SALICYLATE LEVEL: Salicylate Lvl: <7 mg/dL (ref 2.8–30.0)

## 2018-10-24 LAB — CBG MONITORING, ED: Glucose-Capillary: 101 mg/dL — ABNORMAL HIGH (ref 70–99)

## 2018-10-24 LAB — ETHANOL: Alcohol, Ethyl (B): 375 mg/dL (ref <10)

## 2018-10-24 LAB — ACETAMINOPHEN LEVEL: Acetaminophen (Tylenol), Serum: <10 ug/mL — ABNORMAL LOW (ref 10–30)

## 2018-10-24 MED ORDER — SODIUM CHLORIDE 0.9 % IV SOLN: INTRAVENOUS | Status: DC

## 2018-10-24 MED ORDER — ACETAMINOPHEN 325 MG PO TABS: 650.0000 mg | ORAL_TABLET | ORAL | Status: DC | PRN

## 2018-10-24 MED ORDER — ALUM & MAG HYDROXIDE-SIMETH 200-200-20 MG/5ML PO SUSP: 30.0000 mL | Freq: Four times a day (QID) | ORAL | Status: DC | PRN

## 2018-10-24 MED ORDER — LORAZEPAM 2 MG/ML IJ SOLN: 0.0000 mg | Freq: Four times a day (QID) | INTRAMUSCULAR | Status: DC

## 2018-10-24 MED ORDER — LORAZEPAM 2 MG/ML IJ SOLN: 0.0000 mg | Freq: Two times a day (BID) | INTRAMUSCULAR | Status: DC

## 2018-10-24 MED ORDER — LORAZEPAM 1 MG PO TABS
0.0000 mg | ORAL_TABLET | Freq: Four times a day (QID) | ORAL | Status: DC
  Filled 2018-10-24: qty 1

## 2018-10-24 MED ORDER — ONDANSETRON HCL 4 MG PO TABS
4.0000 mg | ORAL_TABLET | Freq: Three times a day (TID) | ORAL | Status: DC | PRN
  Administered 2018-10-24 – 2018-10-25 (×2): 4 mg via ORAL
  Filled 2018-10-24 (×3): qty 1

## 2018-10-24 MED ORDER — LORAZEPAM 1 MG PO TABS
0.0000 mg | ORAL_TABLET | Freq: Two times a day (BID) | ORAL | Status: DC
  Administered 2018-10-25: 1 mg via ORAL

## 2018-10-24 MED ORDER — LORAZEPAM 2 MG/ML IJ SOLN: 1.0000 mg | Freq: Once | INTRAMUSCULAR | Status: DC

## 2018-10-24 MED ORDER — THIAMINE HCL 100 MG/ML IJ SOLN: 100.0000 mg | Freq: Every day | INTRAMUSCULAR | Status: DC

## 2018-10-24 MED ORDER — LORAZEPAM 1 MG PO TABS
1.0000 mg | ORAL_TABLET | Freq: Once | ORAL | Status: AC
  Administered 2018-10-24: 1 mg via ORAL
  Filled 2018-10-24: qty 1

## 2018-10-24 MED ORDER — ONDANSETRON HCL 4 MG/2ML IJ SOLN
4.0000 mg | Freq: Once | INTRAMUSCULAR | Status: AC
  Administered 2018-10-24: 4 mg via INTRAVENOUS
  Filled 2018-10-24: qty 2

## 2018-10-24 MED ORDER — VITAMIN B-1 100 MG PO TABS
100.0000 mg | ORAL_TABLET | Freq: Every day | ORAL | Status: DC
  Administered 2018-10-24: 100 mg via ORAL
  Filled 2018-10-24: qty 1

## 2018-10-24 MED ORDER — SODIUM CHLORIDE 0.9 % IV BOLUS
1000.0000 mL | Freq: Once | INTRAVENOUS | Status: AC
  Administered 2018-10-24: 1000 mL via INTRAVENOUS

## 2018-10-24 NOTE — ED Notes (Signed)
Patient transported to CT 

## 2018-10-24 NOTE — ED Provider Notes (Signed)
[facility]-EMERGENCY DEPT Provider Note   CSN: [id number] Arrival date & time: 10/24/18  1722    History   Chief Complaint Chief Complaint  Patient presents with  . Drug Overdose    HPI Aimee Thompson is a 48 y.o. female.     Pt presents to the ED today as an overdose.  Pt was found on her couch by her parents today unresponsive.  Pt lives alone, but knew her mom was coming over to talk about her alcohol abuse.  Pt said she felt suicidal and took "pain pills" and alcohol.  She is unable to say which pills specifically.  Additional history obtained from her mom who found her.  Around noon, pt was able to get up and let mom in, then went right back in and laid on the couch.  She was able to walk to the car, but mom unable to get her out of the car.  Husband and friend had to physically pick her up.  Mom said she lost her job.  She got a DUI 2 weeks ago.  She is having trouble with her significant other and still also has trouble with her ex-spouse.     Past Medical History:  Diagnosis Date  . ADD (attention deficit disorder)   . ASTHMA   . EtOH dependence ([facility])   . HEART MURMUR, HX OF   . Hypertension   . Hypertriglyceridemia, familial     Patient Active Problem List   Diagnosis Date Noted  . Alcohol use 11/21/2017  . Hyperglycemia 10/20/2016  . Acute pancreatic necrosis   . Distended abdomen   . Hypertriglyceridemia 10/01/2015  . Acute pancreatitis 09/30/2015  . Abdominal pain 03/26/2015  . Hematochezia 03/26/2015  . Right hand pain 07/01/2014  . Acute sinus infection 01/24/2013  . Other malaise and fatigue 10/31/2012  . Night sweats 10/31/2012  . Essential hypertension, benign 10/31/2012  . Anxiety and depression 07/11/2011  . Vertigo 08/09/2010  . ADD (attention deficit disorder) 08/09/2010  . Preventative health care 08/06/2010  . Asthma 06/01/2010  . WEIGHT LOSS 06/03/2009  . HEART MURMUR, HX OF 11/03/2006    Past Surgical  History:  Procedure Laterality Date  . CESAREAN SECTION  2011  . KNEE SURGERY  1990  . laproscopy    . SHOULDER SURGERY Right 2012     OB History   No obstetric history on file.      Home Medications    Prior to Admission medications   Medication Sig Start Date End Date Taking? Authorizing Provider  albuterol (PROVENTIL HFA;VENTOLIN HFA) 108 (90 Base) MCG/ACT inhaler Inhale 2 puffs into the lungs every 6 (six) hours as needed for wheezing or shortness of breath. 03/26/15  Yes Corwin LevinsJohn, James W, MD  B Complex-C (SUPER B COMPLEX/VITAMIN C PO) Take 1 tablet by mouth daily.   Yes [provider]  cholecalciferol (VITAMIN D3) 25 MCG (1000 UT) tablet Take 1,000 Units by mouth daily.   Yes [provider]  cholecalciferol (VITAMIN D3) 25 MCG (1000 UT) tablet Take 1,000 Units by mouth daily.   Yes [provider]  escitalopram (LEXAPRO) 20 MG tablet TAKE 1 TABLET BY MOUTH ONCE DAILY Patient taking differently: Take 20 mg by mouth daily.  03/15/18  Yes Corwin LevinsJohn, James W, MD  fenofibrate 160 MG tablet Take 1 tablet (160 mg total) by mouth daily. 12/16/15  Yes Corwin LevinsJohn, James W, MD  gabapentin (NEURONTIN) 300 MG capsule Take 300 mg by mouth 2 (two)  times a day. 08/15/18  Yes [provider]  losartan (COZAAR) 100 MG tablet Take 1 tablet (100 mg total) by mouth daily. 11/21/17  Yes Biagio Borg, MD  magnesium oxide (MAG-OX) 400 MG tablet Take 400 mg by mouth daily.   Yes [provider]  Vilazodone HCl (VIIBRYD) 40 MG TABS Take 40 mg by mouth daily. 08/20/18  Yes [provider]  vitamin B-12 (CYANOCOBALAMIN) 100 MCG tablet Take 100 mcg by mouth daily.   Yes [provider]  budesonide-formoterol (SYMBICORT) 160-4.5 MCG/ACT inhaler Inhale 2 puffs into the lungs 2 (two) times daily. For Shortness of breath Patient taking differently: Inhale 2 puffs into the lungs 2 (two) times daily as needed (For shortness of breath.).  06/04/13   Biagio Borg, MD     Family History Family History  Problem Relation Age of Onset  . Hypertension Other   . Colon cancer Neg Hx   . Esophageal cancer Neg Hx   . Pancreatic cancer Neg Hx   . Colitis Neg Hx   . Crohn's disease Neg Hx   . Kidney disease Neg Hx   . Liver disease Neg Hx   . Heart disease Neg Hx   . Stomach cancer Neg Hx     Social History Social History   Tobacco Use  . Smoking status: Never Smoker  . Smokeless tobacco: Never Used  Substance Use Topics  . Alcohol use: Yes    Alcohol/week: 0.0 standard drinks    Comment: 5 bottles of wine a week  . Drug use: No     Allergies   Penicillins and Scallops [shellfish allergy]   Review of Systems Review of Systems  Psychiatric/Behavioral: Positive for suicidal ideas.     Physical Exam Updated Vital Signs BP 124/83   Pulse 83   Temp 97.7 F (36.5 C) (Oral)   Resp 16   Ht 5\' 3"  (1.6 m)   Wt 67 kg   LMP 09/25/2018 (Exact Date)   SpO2 100%   BMI 26.17 kg/m   Physical Exam Vitals signs and nursing note reviewed.  Constitutional:      Appearance: Normal appearance.  HENT:     Head: Normocephalic and atraumatic.     Right Ear: External ear normal.     Left Ear: External ear normal.     Nose: Nose normal.     Mouth/Throat:     Mouth: Mucous membranes are dry.  Eyes:     Extraocular Movements: Extraocular movements intact.     Conjunctiva/sclera: Conjunctivae normal.     Pupils: Pupils are equal, round, and reactive to light.  Neck:     Musculoskeletal: Normal range of motion and neck supple.  Cardiovascular:     Rate and Rhythm: Normal rate and regular rhythm.     Pulses: Normal pulses.     Heart sounds: Normal heart sounds.  Pulmonary:     Effort: Pulmonary effort is normal.     Breath sounds: Normal breath sounds.  Abdominal:     General: Abdomen is flat. Bowel sounds are normal.     Palpations: Abdomen is soft.  Musculoskeletal: Normal range of motion.  Skin:    General: Skin is warm.     Capillary  Refill: Capillary refill takes less than 2 seconds.  Neurological:     Mental Status: She is alert.     Comments: Pt is moving all extremities.  She is rolling around in bed.  Psychiatric:  Mood and Affect: Mood normal. Affect is tearful.        Behavior: Behavior normal.        Thought Content: Thought content includes suicidal ideation.      ED Treatments / Results  Labs (all labs ordered are listed, but only abnormal results are displayed) Labs Reviewed  COMPREHENSIVE METABOLIC PANEL - Abnormal; Notable for the following components:      Result Value   CO2 21 (*)    Glucose, Bld 108 (*)    Calcium 8.2 (*)    Total Bilirubin 0.2 (*)    Anion gap 18 (*)    All other components within normal limits  ACETAMINOPHEN LEVEL - Abnormal; Notable for the following components:   Acetaminophen (Tylenol), Serum <10 (*)    All other components within normal limits  ETHANOL - Abnormal; Notable for the following components:   Alcohol, Ethyl (B) 375 (*)    All other components within normal limits  CBC WITH DIFFERENTIAL/PLATELET - Abnormal; Notable for the following components:   Basophils Absolute 0.2 (*)    All other components within normal limits  URINALYSIS, ROUTINE W REFLEX MICROSCOPIC - Abnormal; Notable for the following components:   Color, Urine STRAW (*)    All other components within normal limits  CBG MONITORING, ED - Abnormal; Notable for the following components:   Glucose-Capillary 101 (*)    All other components within normal limits  SALICYLATE LEVEL  RAPID URINE DRUG SCREEN, HOSP PERFORMED  LIPASE, BLOOD  I-STAT BETA HCG BLOOD, ED ([facility], [facility], [facility] ONLY)    EKG None  Radiology Ct Head Wo Contrast  Result Date: 10/24/2018 CLINICAL DATA:  Altered mental status with loss of consciousness following alcohol and pain pill ingestion earlier today. EXAM: CT HEAD WITHOUT CONTRAST TECHNIQUE: Contiguous axial images were obtained from the base of the skull through the vertex  without intravenous contrast. COMPARISON:  Head CT report dated 03/23/2003. FINDINGS: Brain: Normal appearing cerebral hemispheres and posterior fossa structures. Normal size and position of the ventricles. No intracranial hemorrhage, mass lesion or CT evidence of acute infarction. Vascular: No hyperdense vessel or unexpected calcification. Skull: Normal. Negative for fracture or focal lesion. Sinuses/Orbits: Unremarkable. Other: None. IMPRESSION: Normal examination. Electronically Signed   By: Beckie SaltsSteven  Reid M.D.   On: 10/24/2018 18:19   Dg Chest Port 1 View  Result Date: 10/24/2018 CLINICAL DATA:  Overdose. EXAM: PORTABLE CHEST 1 VIEW COMPARISON:  12/26/2014 FINDINGS: The heart size and mediastinal contours are within normal limits. There is no evidence of pulmonary edema, consolidation, pneumothorax, nodule or pleural fluid. The visualized skeletal structures are unremarkable. IMPRESSION: No active disease. Electronically Signed   By: Irish LackGlenn  Yamagata M.D.   On: 10/24/2018 17:43    Procedures Procedures (including critical care time)  Medications Ordered in ED Medications  sodium chloride 0.9 % bolus 1,000 mL (1,000 mLs Intravenous New Bag/Given 10/24/18 1814)    And  0.9 %  sodium chloride infusion (has no administration in time range)  LORazepam (ATIVAN) injection 0-4 mg (has no administration in time range)    Or  LORazepam (ATIVAN) tablet 0-4 mg (has no administration in time range)  LORazepam (ATIVAN) injection 0-4 mg (has no administration in time range)    Or  LORazepam (ATIVAN) tablet 0-4 mg (has no administration in time range)  thiamine (VITAMIN B-1) tablet 100 mg (has no administration in time range)    Or  thiamine (B-1) injection 100 mg (has no administration in time range)  acetaminophen (TYLENOL) tablet 650 mg (has no administration in time range)  ondansetron (ZOFRAN) tablet 4 mg (has no administration in time range)  alum & mag hydroxide-simeth (MAALOX/MYLANTA) 200-200-20  MG/5ML suspension 30 mL (has no administration in time range)  ondansetron (ZOFRAN) injection 4 mg (4 mg Intravenous Given 10/24/18 1802)     Initial Impression / Assessment and Plan / ED Course  I have reviewed the triage vital signs and the nursing notes.  Pertinent labs & imaging results that were available during my care of the patient were reviewed by me and considered in my medical decision making (see chart for details).    Pt given IVFs and zofran.  She is more alert.    OD occurred around noon, so the tylenol level was about at 5.5 hrs s/p od and is ok.  No need for a repeat.    Pt's mom updated.  CIWA protocol started.  TTS will be consulted.    Final Clinical Impressions(s) / ED Diagnoses   Final diagnoses:  Alcohol abuse  Suicidal ideation  Intentional drug overdose, initial encounter [facility](HCC)    ED Discharge Orders    None       Jacalyn LefevreHaviland, Makail Watling, MD 10/25/18 1513

## 2018-10-24 NOTE — ED Notes (Signed)
Patients family called requesting an update. Provided Dr. Gilford Raid with the information to call the patient's family, in which she said she would.
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
## 2018-10-24 NOTE — ED Triage Notes (Signed)
Pt was dropped off at ER. Pt states that she took "A bunch of pain pills" at an unknown time earlier today. Pt also states that she drank alcohol with these pills.

## 2018-10-24 NOTE — ED Notes (Signed)
Reports feeling better less nauseous, states she never vomited. She is feeling more relaxed. Waiting on COVID results to send her to [facility].

## 2018-10-24 NOTE — ED Notes (Signed)
States lost her job recently related to [place] and struggling financially. She has applied for unemployment but has not received a check to date. Also, a relationship recently ended. She states her parents are her support system and she is still living in her own home with her two children. 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 She is pleasant and cooperative. States she drank heavily in a SI attempt but is able to endorse safety while here. She is currently speaking with the counselor via tele med.

## 2018-10-24 NOTE — ED Notes (Signed)
Complaining of nausea and temp slightly elevated at 99.3. She has Ativan PO ordered once to help her sleep independent of CIWA. Gave her ZOfran and Ativan 1mg  for her complaints. She is tearful, stressed. She has been sober for 7 months prior to tonight. She has a bed at [facility] now pending her COVID test being negative.

## 2018-10-24 NOTE — ED Notes (Signed)
Pt jewlery of 2 rings placed in specimen cup inside of personal belongings bag

## 2018-10-24 NOTE — ED Notes (Signed)
Triage transferred her to room 30 for ongoing monitoring for a safe detox. One white hospital bag brought over with her and put in locker 30 unchecked. She is cooperative on admission. Sitter present. Offered and given food and drink.

## 2018-10-25 ENCOUNTER — Encounter (HOSPITAL_COMMUNITY): Payer: Self-pay | Admitting: Psychiatric/Mental Health

## 2018-10-25 ENCOUNTER — Inpatient Hospital Stay (HOSPITAL_COMMUNITY)
Admission: AD | Admit: 2018-10-25 | Discharge: 2018-10-29 | DRG: 885 | Disposition: A | Discharge status: Home / Self Care | Payer: BLUE CROSS/BLUE SHIELD | Source: Intra-hospital | Attending: Psychiatry | Admitting: Psychiatry

## 2018-10-25 DIAGNOSIS — Z7951 Long term (current) use of inhaled steroids: Secondary | ICD-10-CM | POA: Diagnosis not present

## 2018-10-25 DIAGNOSIS — Z91013 Allergy to seafood: Secondary | ICD-10-CM | POA: Diagnosis not present

## 2018-10-25 DIAGNOSIS — F10229 Alcohol dependence with intoxication, unspecified: Secondary | ICD-10-CM | POA: Diagnosis present

## 2018-10-25 DIAGNOSIS — F1024 Alcohol dependence with alcohol-induced mood disorder: Secondary | ICD-10-CM

## 2018-10-25 DIAGNOSIS — Z20828 Contact with and (suspected) exposure to other viral communicable diseases: Secondary | ICD-10-CM | POA: Diagnosis present

## 2018-10-25 DIAGNOSIS — I10 Essential (primary) hypertension: Secondary | ICD-10-CM | POA: Diagnosis present

## 2018-10-25 DIAGNOSIS — R45851 Suicidal ideations: Secondary | ICD-10-CM | POA: Diagnosis present

## 2018-10-25 DIAGNOSIS — Y908 Blood alcohol level of 240 mg/100 ml or more: Secondary | ICD-10-CM | POA: Diagnosis present

## 2018-10-25 DIAGNOSIS — F988 Other specified behavioral and emotional disorders with onset usually occurring in childhood and adolescence: Secondary | ICD-10-CM | POA: Diagnosis present

## 2018-10-25 DIAGNOSIS — Z79899 Other long term (current) drug therapy: Secondary | ICD-10-CM

## 2018-10-25 DIAGNOSIS — F332 Major depressive disorder, recurrent severe without psychotic features: Secondary | ICD-10-CM | POA: Diagnosis present

## 2018-10-25 DIAGNOSIS — Z880 Allergy status to penicillin: Secondary | ICD-10-CM

## 2018-10-25 DIAGNOSIS — E781 Pure hyperglyceridemia: Secondary | ICD-10-CM | POA: Diagnosis present

## 2018-10-25 DIAGNOSIS — F1094 Alcohol use, unspecified with alcohol-induced mood disorder: Secondary | ICD-10-CM | POA: Diagnosis not present

## 2018-10-25 LAB — SARS CORONAVIRUS 2 (TAT 6-24 HRS): SARS Coronavirus 2: NEGATIVE

## 2018-10-25 MED ORDER — ADULT MULTIVITAMIN W/MINERALS CH
1.0000 | ORAL_TABLET | Freq: Every day | ORAL | Status: DC
  Administered 2018-10-25 – 2018-10-29 (×5): 1 via ORAL
  Filled 2018-10-25 (×7): qty 1

## 2018-10-25 MED ORDER — CHLORDIAZEPOXIDE HCL 25 MG PO CAPS: 25.0000 mg | ORAL_CAPSULE | Freq: Four times a day (QID) | ORAL | Status: AC | PRN

## 2018-10-25 MED ORDER — LOSARTAN POTASSIUM 50 MG PO TABS
100.0000 mg | ORAL_TABLET | Freq: Every day | ORAL | Status: DC
  Administered 2018-10-25 – 2018-10-29 (×5): 100 mg via ORAL
  Filled 2018-10-25 (×7): qty 2

## 2018-10-25 MED ORDER — THIAMINE HCL 100 MG/ML IJ SOLN: 100.0000 mg | Freq: Once | INTRAMUSCULAR | Status: DC

## 2018-10-25 MED ORDER — TRAZODONE HCL 50 MG PO TABS
50.0000 mg | ORAL_TABLET | Freq: Every evening | ORAL | Status: DC | PRN
  Administered 2018-10-25: 50 mg via ORAL
  Filled 2018-10-25: qty 1

## 2018-10-25 MED ORDER — ALUM & MAG HYDROXIDE-SIMETH 200-200-20 MG/5ML PO SUSP: 30.0000 mL | ORAL | Status: DC | PRN

## 2018-10-25 MED ORDER — ONDANSETRON 4 MG PO TBDP
4.0000 mg | ORAL_TABLET | Freq: Four times a day (QID) | ORAL | Status: DC | PRN
  Administered 2018-10-25: 4 mg via ORAL
  Filled 2018-10-25: qty 1

## 2018-10-25 MED ORDER — VILAZODONE HCL 10 MG PO TABS
20.0000 mg | ORAL_TABLET | Freq: Every day | ORAL | Status: DC
  Administered 2018-10-25 – 2018-10-29 (×5): 20 mg via ORAL
  Filled 2018-10-25 (×8): qty 2

## 2018-10-25 MED ORDER — GABAPENTIN 300 MG PO CAPS
300.0000 mg | ORAL_CAPSULE | Freq: Two times a day (BID) | ORAL | Status: DC
  Administered 2018-10-25 – 2018-10-29 (×8): 300 mg via ORAL
  Filled 2018-10-25 (×12): qty 1

## 2018-10-25 MED ORDER — MAGNESIUM HYDROXIDE 400 MG/5ML PO SUSP: 30.0000 mL | Freq: Every day | ORAL | Status: DC | PRN

## 2018-10-25 MED ORDER — LOPERAMIDE HCL 2 MG PO CAPS: 2.0000 mg | ORAL_CAPSULE | ORAL | Status: DC | PRN

## 2018-10-25 MED ORDER — HYDROXYZINE HCL 25 MG PO TABS
25.0000 mg | ORAL_TABLET | Freq: Four times a day (QID) | ORAL | Status: DC | PRN
  Administered 2018-10-25 – 2018-10-26 (×2): 25 mg via ORAL
  Filled 2018-10-25 (×2): qty 1

## 2018-10-25 MED ORDER — VITAMIN B-1 100 MG PO TABS
100.0000 mg | ORAL_TABLET | Freq: Every day | ORAL | Status: DC
  Administered 2018-10-26 – 2018-10-29 (×4): 100 mg via ORAL
  Filled 2018-10-25 (×6): qty 1

## 2018-10-25 MED ORDER — ACETAMINOPHEN 325 MG PO TABS
650.0000 mg | ORAL_TABLET | Freq: Four times a day (QID) | ORAL | Status: DC | PRN
  Administered 2018-10-27 – 2018-10-29 (×4): 650 mg via ORAL
  Filled 2018-10-25 (×4): qty 2
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
## 2018-10-25 NOTE — H&P (Addendum)
Psychiatric Admission Assessment Adult  Patient Identification: Aimee Thompson MRN:  [id number] Date of Evaluation:  10/25/2018 Chief Complaint:  "I drank a shit ton" Principal Diagnosis: <principal problem not specified> Diagnosis:  Active Problems:   MDD (major depressive disorder), recurrent severe, without psychosis ([place])   History of Present Illness: Aimee Thompson is a 48 year old female with history of depression, alcohol use disorder, and HTN, presenting for treatment of alcohol dependence with suicidal ideation. She relapsed on alcohol in April 2020 after losing her job related to the pandemic. She reports drinking 1-2 bottles of wine per day. Her parents went to pick her up for dinner and found her intoxicated in her home, making suicidal statements. They brought her to the hospital. She does not recall the events prior to admission. She has superficial marks on her neck and states "I guess I must have tried to strangle myself." CT of head was done in ED with no acute findings. Her live-in girlfriend left her two months ago related to the alcohol as well as stress with her children. She reports increased depression and drinking since that time but denies most symptoms of depression. She denies SI apart from alcohol use. She had a DUI two weeks ago. She denies HI/AVH. She reports current withdrawal symptoms- diaphoresis, diarrhea, nausea, headache, insomnia. She denies history of seizures or DTs during withdrawal. She denies drug use outside of alcohol. UDS negative. Admission BAL 375. She reports compliance with Viibryd 40 mg daily and Neurontin 300 mg BID and feels these medications have been beneficial.  Associated Signs/Symptoms: Depression Symptoms:  depressed mood, insomnia, suicidal attempt, (Hypo) Manic Symptoms:  denies Anxiety Symptoms:  Excessive Worry, Psychotic Symptoms:  denies PTSD Symptoms: denies Total Time spent with patient: 45 minutes  Past Psychiatric History:  History of depression and alcohol use disorder. One prior hospitalization at [facility] in 2014 for depression and alcohol use during her divorce. Denies history of manic or psychotic symptoms. Denies history of self-injurious behaviors or suicide attempts.  Is the patient at risk to self? Yes.    Has the patient been a risk to self in the past 6 months? No.  Has the patient been a risk to self within the distant past? Yes.    Is the patient a risk to others? No.  Has the patient been a risk to others in the past 6 months? No.  Has the patient been a risk to others within the distant past? No.   Prior Inpatient Therapy:   Prior Outpatient Therapy:    Alcohol Screening: 1. How often do you have a drink containing alcohol?: 4 or more times a week 2. How many drinks containing alcohol do you have on a typical day when you are drinking?: 10 or more 3. How often do you have six or more drinks on one occasion?: Daily or almost daily AUDIT-C Score: 12 4. How often during the last year have you found that you were not able to stop drinking once you had started?: Daily or almost daily 5. How often during the last year have you failed to do what was normally expected from you becasue of drinking?: Daily or almost daily 6. How often during the last year have you needed a first drink in the morning to get yourself going after a heavy drinking session?: Daily or almost daily 7. How often during the last year have you had a feeling of guilt of remorse after drinking?: Daily or almost daily 8. How often during  the last year have you been unable to remember what happened the night before because you had been drinking?: Weekly 9. Have you or someone else been injured as a result of your drinking?: Yes, during the last year 10. Has a relative or friend or a doctor or another health worker been concerned about your drinking or suggested you cut down?: Yes, during the last year Alcohol Use Disorder Identification Test  Final Score (AUDIT): 39 Substance Abuse History in the last 12 months:  Yes.   Consequences of Substance Abuse: Legal Consequences:  DUI Family Consequences:  girlfriend left her Blackouts:  blackout prior to admission Withdrawal Symptoms:   Diarrhea Headaches Nausea Previous Psychotropic Medications: Yes  Psychological Evaluations: No  Past Medical History:  Past Medical History:  Diagnosis Date  . ADD (attention deficit disorder)   . ASTHMA   . EtOH dependence ([place])   . HEART MURMUR, HX OF   . Hypertension   . Hypertriglyceridemia, familial     Past Surgical History:  Procedure Laterality Date  . CESAREAN SECTION  2011  . KNEE SURGERY  1990  . laproscopy    . SHOULDER SURGERY Right 2012   Family History:  Family History  Problem Relation Age of Onset  . Hypertension Other   . Colon cancer Neg Hx   . Esophageal cancer Neg Hx   . Pancreatic cancer Neg Hx   . Colitis Neg Hx   . Crohn's disease Neg Hx   . Kidney disease Neg Hx   . Liver disease Neg Hx   . Heart disease Neg Hx   . Stomach cancer Neg Hx    Family Psychiatric  History: She reports her parents "drink a decent amount daily." Tobacco Screening:   Social History:  Social History   Substance and Sexual Activity  Alcohol Use Yes  . Alcohol/week: 0.0 standard drinks   Comment: 1-2 bottles of wine/day     Social History   Substance and Sexual Activity  Drug Use No    Additional Social History:                           Allergies:   Allergies  Allergen Reactions  . Penicillins Hives    Has patient had a PCN reaction causing immediate rash, facial/tongue/throat swelling, SOB or lightheadedness with hypotension: no Has patient had a PCN reaction causing severe rash involving mucus membranes or skin necrosis: no Has patient had a PCN reaction that required hospitalization no Has patient had a PCN reaction occurring within the last 10 years: yes If all of the above answers are "NO", then  may proceed with Cephalosporin use.  Aimee Thompson [Shellfish Allergy] Itching and Other (See Comments)    Causes redness   Lab Results:  Results for orders placed or performed during the hospital encounter of 10/24/18 (from the past 48 hour(s))  Comprehensive metabolic panel     Status: Abnormal   Collection Time: 10/24/18  5:28 PM  Result Value Ref Range   Sodium 142 135 - 145 mmol/L   Potassium 3.7 3.5 - 5.1 mmol/L   Chloride 103 98 - 111 mmol/L   CO2 21 (L) 22 - 32 mmol/L   Glucose, Bld 108 (H) 70 - 99 mg/dL   BUN 9 6 - 20 mg/dL   Creatinine, Ser 0.75 0.44 - 1.00 mg/dL   Calcium 8.2 (L) 8.9 - 10.3 mg/dL   Total Protein 6.8 6.5 - 8.1 g/dL  Albumin 4.0 3.5 - 5.0 g/dL   AST 28 15 - 41 U/L   ALT 19 0 - 44 U/L   Alkaline Phosphatase 50 38 - 126 U/L   Total Bilirubin 0.2 (L) 0.3 - 1.2 mg/dL   GFR calc non Af Amer >60 >60 mL/min   GFR calc Af Amer >60 >60 mL/min   Anion gap 18 (H) 5 - 15    Comment: Performed at [facility], [place], [place] [address] [place], [place] [address]  CBC WITH DIFFERENTIAL     Status: Abnormal   Collection Time: 10/24/18  5:28 PM  Result Value Ref Range   WBC 8.9 4.0 - 10.5 K/uL   RBC 4.01 3.87 - 5.11 MIL/uL   Hemoglobin 13.1 12.0 - 15.0 g/dL   HCT 39.0 36.0 - 46.0 %   MCV 97.3 80.0 - 100.0 fL   MCH 32.7 26.0 - 34.0 pg   MCHC 33.6 30.0 - 36.0 g/dL   RDW 14.2 11.5 - 15.5 %   Platelets 270 150 - 400 K/uL   nRBC 0.0 0.0 - 0.2 %   Neutrophils Relative % 66 %   Neutro Abs 5.9 1.7 - 7.7 K/uL   Lymphocytes Relative 28 %   Lymphs Abs 2.5 0.7 - 4.0 K/uL   Monocytes Relative 3 %   Monocytes Absolute 0.3 0.1 - 1.0 K/uL   Eosinophils Relative 0 %   Eosinophils Absolute 0.0 0.0 - 0.5 K/uL   Basophils Relative 2 %   Basophils Absolute 0.2 (H) 0.0 - 0.1 K/uL   Immature Granulocytes 1 %   Abs Immature Granulocytes 0.07 0.00 - 0.07 K/uL    Comment: Performed at [facility], [place] [address], [place], [place] [address]  Salicylate  level     Status: None   Collection Time: 10/24/18  5:50 PM  Result Value Ref Range   Salicylate Lvl <4.0 2.8 - 30.0 mg/dL    Comment: Performed at [facility], [place] [address], [place], [place] [address]  Acetaminophen level     Status: Abnormal   Collection Time: 10/24/18  5:50 PM  Result Value Ref Range   Acetaminophen (Tylenol), Serum <10 (L) 10 - 30 ug/mL    Comment: (NOTE) Therapeutic concentrations vary significantly. A range of 10-30 ug/mL  may be an effective concentration for many patients. However, some  are best treated at concentrations outside of this range. Acetaminophen concentrations >150 ug/mL at 4 hours after ingestion  and >50 ug/mL at 12 hours after ingestion are often associated with  toxic reactions. Performed at [facility], [place] [address], [place], [place] [address]   Ethanol     Status: Abnormal   Collection Time: 10/24/18  5:50 PM  Result Value Ref Range   Alcohol, Ethyl (B) 375 (HH) <10 mg/dL    Comment: CRITICAL RESULT CALLED TO, READ BACK BY AND VERIFIED WITH: TIM SMITH,RN [id number] @ 1852 BY J SCOTTON (NOTE) Lowest detectable limit for serum alcohol is 10 mg/dL. For medical purposes only. Performed at [facility], [place] [address] [place], [place] [address]   CBG monitoring, ED     Status: Abnormal   Collection Time: 10/24/18  5:57 PM  Result Value Ref Range   Glucose-Capillary 101 (H) 70 - 99 mg/dL  I-Stat beta hCG blood, ED     Status: None   Collection Time: 10/24/18  5:58 PM  Result Value Ref Range   I-stat hCG, quantitative <5.0 <5 mIU/mL  Comment 3            Comment:   GEST. AGE      CONC.  (mIU/mL)   <=1 WEEK        5 - 50     2 WEEKS       50 - 500     3 WEEKS       100 - 10,000     4 WEEKS     1,000 - 30,000        FEMALE AND NON-PREGNANT FEMALE:     LESS THAN 5 mIU/mL   Urine rapid drug screen (hosp performed)     Status: None   Collection Time: 10/24/18  7:21 PM  Result  Value Ref Range   Opiates NONE DETECTED NONE DETECTED   Cocaine NONE DETECTED NONE DETECTED   Benzodiazepines NONE DETECTED NONE DETECTED   Amphetamines NONE DETECTED NONE DETECTED   Tetrahydrocannabinol NONE DETECTED NONE DETECTED   Barbiturates NONE DETECTED NONE DETECTED    Comment: (NOTE) DRUG SCREEN FOR MEDICAL PURPOSES ONLY.  IF CONFIRMATION IS NEEDED FOR ANY PURPOSE, NOTIFY LAB WITHIN 5 DAYS. LOWEST DETECTABLE LIMITS FOR URINE DRUG SCREEN Drug Class                     Cutoff (ng/mL) Amphetamine and metabolites    1000 Barbiturate and metabolites    200 Benzodiazepine                 025 Tricyclics and metabolites     300 Opiates and metabolites        300 Cocaine and metabolites        300 THC                            50 Performed at [facility], [place] [address], [place], [place] [address]   Urinalysis, Routine w reflex microscopic     Status: Abnormal   Collection Time: 10/24/18  7:21 PM  Result Value Ref Range   Color, Urine STRAW (A) YELLOW   APPearance CLEAR CLEAR   Specific Gravity, Urine 1.005 1.005 - 1.030   pH 6.0 5.0 - 8.0   Glucose, UA NEGATIVE NEGATIVE mg/dL   Hgb urine dipstick NEGATIVE NEGATIVE   Bilirubin Urine NEGATIVE NEGATIVE   Ketones, ur NEGATIVE NEGATIVE mg/dL   Protein, ur NEGATIVE NEGATIVE mg/dL   Nitrite NEGATIVE NEGATIVE   Leukocytes,Ua NEGATIVE NEGATIVE    Comment: Performed at [place] [address] [place], [place] [address]  Lipase, blood     Status: None   Collection Time: 10/24/18  8:27 PM  Result Value Ref Range   Lipase 22 11 - 51 U/L    Comment: Performed at [facility], [place] [address] [place], [place] [address]  SARS CORONAVIRUS 2 Nasal Swab Aptima Multi Swab     Status: None   Collection Time: 10/24/18  8:40 PM   Specimen: Aptima Multi Swab; Nasal Swab  Result Value Ref Range   SARS Coronavirus 2 NEGATIVE NEGATIVE    Comment: (NOTE) SARS-CoV-2 target  nucleic acids are NOT DETECTED. The SARS-CoV-2 RNA is generally detectable in upper and lower respiratory specimens during the acute phase of infection. Negative results do not preclude SARS-CoV-2 infection, do not rule out co-infections with other pathogens, and should not be used as the sole basis for treatment or other patient management decisions. Negative results  must be combined with clinical observations, patient history, and epidemiological information. The expected result is Negative. Fact Sheet for Patients: SugarRoll.be Fact Sheet for Healthcare Providers: https://www.woods-mathews.com/ This test is not yet approved or cleared by the [place] FDA and  has been authorized for detection and/or diagnosis of SARS-CoV-2 by FDA under an Emergency Use Authorization (EUA). This EUA will remain  in effect (meaning this test can be used) for the duration of the COVID-19 declaration under Section 56 4(b)(1) of the Act, 21 U.S.C. section 360bbb-3(b)(1), unless the authorization is terminated or revoked sooner. Performed at [facility], [place] [address] [place], [place] [address]     Blood Alcohol level:  Lab Results  Component Value Date   ETH 375 [facility]) 10/24/2018   ETH 251 (H) 60/45/4098    Metabolic Disorder Labs:  Lab Results  Component Value Date   HGBA1C 5.6 10/20/2016   MPG 100 10/07/2015   No results found for: PROLACTIN Lab Results  Component Value Date   CHOL 177 10/20/2016   TRIG 147.0 10/20/2016   HDL 67.50 10/20/2016   CHOLHDL 3 10/20/2016   VLDL 29.4 10/20/2016   LDLCALC 80 10/20/2016   LDLCALC 115 (H) 10/29/2015    Current Medications: Current Facility-Administered Medications  Medication Dose Route Frequency Provider Last Rate Last Dose  . acetaminophen (TYLENOL) tablet 650 mg  650 mg Oral Q6H PRN Dixon, Rashaun M, NP      . alum & mag hydroxide-simeth (MAALOX/MYLANTA) 200-200-20 MG/5ML suspension  30 mL  30 mL Oral Q4H PRN Dixon, Rashaun M, NP      . chlordiazePOXIDE (LIBRIUM) capsule 25 mg  25 mg Oral Q6H PRN Cobos, Myer Peer, MD      . gabapentin (NEURONTIN) capsule 300 mg  300 mg Oral BID Cobos, Myer Peer, MD      . hydrOXYzine (ATARAX/VISTARIL) tablet 25 mg  25 mg Oral Q6H PRN Cobos, Myer Peer, MD      . loperamide (IMODIUM) capsule 2-4 mg  2-4 mg Oral PRN Cobos, Myer Peer, MD      . losartan (COZAAR) tablet 100 mg  100 mg Oral Daily Cobos, Fernando A, MD      . magnesium hydroxide (MILK OF MAGNESIA) suspension 30 mL  30 mL Oral Daily PRN Deloria Lair, NP      . multivitamin with minerals tablet 1 tablet  1 tablet Oral Daily Cobos, Myer Peer, MD      . ondansetron (ZOFRAN-ODT) disintegrating tablet 4 mg  4 mg Oral Q6H PRN Cobos, Myer Peer, MD      . thiamine (B-1) injection 100 mg  100 mg Intramuscular Once Cobos, Myer Peer, MD      . Derrill Memo ON 10/26/2018] thiamine (VITAMIN B-1) tablet 100 mg  100 mg Oral Daily Cobos, Fernando A, MD      . traZODone (DESYREL) tablet 50 mg  50 mg Oral QHS PRN Deloria Lair, NP      . Vilazodone HCl (VIIBRYD) TABS 20 mg  20 mg Oral Daily Cobos, Myer Peer, MD       PTA Medications: Medications Prior to Admission  Medication Sig Dispense Refill Last Dose  . albuterol (PROVENTIL HFA;VENTOLIN HFA) 108 (90 Base) MCG/ACT inhaler Inhale 2 puffs into the lungs every 6 (six) hours as needed for wheezing or shortness of breath. 1 Inhaler 5   . B Complex-C (SUPER B COMPLEX/VITAMIN C PO) Take 1 tablet by mouth daily.     . budesonide-formoterol (SYMBICORT)  160-4.5 MCG/ACT inhaler Inhale 2 puffs into the lungs 2 (two) times daily. For Shortness of breath (Patient taking differently: Inhale 2 puffs into the lungs 2 (two) times daily as needed (For shortness of breath.). ) 1 Inhaler 6   . cholecalciferol (VITAMIN D3) 25 MCG (1000 UT) tablet Take 1,000 Units by mouth daily.     . cholecalciferol (VITAMIN D3) 25 MCG (1000 UT) tablet Take 1,000 Units by  mouth daily.     Marland Kitchen escitalopram (LEXAPRO) 20 MG tablet TAKE 1 TABLET BY MOUTH ONCE DAILY (Patient taking differently: Take 20 mg by mouth daily. ) 90 tablet 3   . fenofibrate 160 MG tablet Take 1 tablet (160 mg total) by mouth daily. 90 tablet 3   . gabapentin (NEURONTIN) 300 MG capsule Take 300 mg by mouth 2 (two) times a day.     . losartan (COZAAR) 100 MG tablet Take 1 tablet (100 mg total) by mouth daily. 90 tablet 3   . magnesium oxide (MAG-OX) 400 MG tablet Take 400 mg by mouth daily.     . Vilazodone HCl (VIIBRYD) 40 MG TABS Take 40 mg by mouth daily.     . vitamin B-12 (CYANOCOBALAMIN) 100 MCG tablet Take 100 mcg by mouth daily.       Musculoskeletal: Strength & Muscle Tone: within normal limits Gait & Station: normal Patient leans: N/A  Psychiatric Specialty Exam: Physical Exam  Nursing note and vitals reviewed. Constitutional: She is oriented to person, place, and time. She appears well-developed and well-nourished.  Cardiovascular: Normal rate.  Respiratory: Effort normal.  Neurological: She is alert and oriented to person, place, and time.    Review of Systems  Constitutional: Positive for diaphoresis. Negative for chills and fever.  Respiratory: Negative for cough and shortness of breath.   Cardiovascular: Negative for chest pain.  Gastrointestinal: Positive for diarrhea and nausea. Negative for vomiting.  Neurological: Positive for headaches. Negative for tremors and sensory change.  Psychiatric/Behavioral: Positive for depression and substance abuse. Negative for hallucinations and suicidal ideas. The patient has insomnia. The patient is not nervous/anxious.     Blood pressure (!) 144/90, pulse 85, temperature 99.2 F (37.3 C), temperature source Oral, resp. rate 18, height 5' 2"  (1.575 m), weight 65.3 kg, last menstrual period 09/25/2018.Body mass index is 26.34 kg/m.  General Appearance: Well Groomed  Eye Contact:  Good  Speech:  Clear and Coherent and Normal Rate   Volume:  Normal  Mood:  Euthymic  Affect:  Congruent  Thought Process:  Coherent  Orientation:  Full (Time, Place, and Person)  Thought Content:  Logical  Suicidal Thoughts:  No  Homicidal Thoughts:  No  Memory:  Immediate;   Good Recent;   Poor Remote;   Good  Judgement:  Intact  Insight:  Fair  Psychomotor Activity:  Normal  Concentration:  Concentration: Good and Attention Span: Good  Recall:  Good  Fund of Knowledge:  Good  Language:  Good  Akathisia:  No  Handed:  Right  AIMS (if indicated):     Assets:  Communication Skills Desire for Improvement Financial Resources/Insurance Housing Leisure Time Resilience Social Support  ADL's:  Intact  Cognition:  WNL  Sleep:       Treatment Plan Summary: Daily contact with patient to assess and evaluate symptoms and progress in treatment and Medication management   Inpatient hospitalization.  See MD's admission SRA for medication management.  Patient will participate in the therapeutic group milieu.  Discharge disposition in progress.  Observation Level/Precautions:  15 minute checks  Laboratory:  a1c TSH  Psychotherapy:  Group therapy  Medications:  See MAR  Consultations:  PRN  Discharge Concerns:  Safety and stabilization  Estimated LOS: 3-5 days  Other:     Physician Treatment Plan for Primary Diagnosis: <principal problem not specified> Long Term Goal(s): Improvement in symptoms so as ready for discharge  Short Term Goals: Ability to identify changes in lifestyle to reduce recurrence of condition will improve, Ability to verbalize feelings will improve and Ability to disclose and discuss suicidal ideas  Physician Treatment Plan for Secondary Diagnosis: Active Problems:   MDD (major depressive disorder), recurrent severe, without psychosis ([place])  Long Term Goal(s): Improvement in symptoms so as ready for discharge  Short Term Goals: Ability to demonstrate self-control will improve and Ability to identify  and develop effective coping behaviors will improve  I certify that inpatient services furnished can reasonably be expected to improve the patient's condition.    Connye Burkitt, NP 8/6/20202:39 PM   I have discussed case with NP and have met with patient  Agree with NP note and assessment  48, divorced, has a three children ( 24, 16, 10) . Two youngest are with ex SO. States she lost job two months ago related to [place].  Patient presented to ED yesterday, brought by parents . Explains " I had a DUI charge 2 weeks ago so cannot drive", parents came to her home to pick her up for dinner and " they found me in quite a state". States she does not remember events that occurred due to alcohol related blackout. In ED initially reported having overdosed on " pain pills" but later denied and at this time does not remember having overdosed . She also reported she might have tried to strangle self because she had some neck soreness and erythema, but states " honestly I don't remember ". Reports she had been feeling depressed recently after her GF had moved out of the house, but denies having had any suicidal ideations recently. Currently does not endorse significant neuro-vegetative symptoms. Denies psychotic symptoms. Patient reports history of alcohol use disorder and reports that she had relapsed in April/2020 following loss of job because of the [place]. States that prior to that she had been sober x about 7 months. States she has been drinking 1-2 bottles of wine on most days. Admission BAL 375. Admission UDS negative. Psychiatric History- one prior psychiatric admission a few years ago for depression, suicidal ideations, in the context of marital separation. Denies history of suicide attempts.No history of self cutting or self injurious behaviors, denies history of psychosis.No clear history of mania or hypomania.Denies history of PTSD, denies history of Panic or Agoraphobia. Reports  history of alcohol use disorder. Denies drug abuse .  Medical History- HTN. Allergic to PCN. Takes Losartan for BP. Denies history of WDL seizures or DTs  Current medications- Viibryd 40 mgrs QDAY ( which she has been on for several months), Neurontin 300 mgrs BID, which was prescribed for anxiety.  Dx- Alcohol Dependence, Alcohol Induced Mood Disorder  Plan- Inpatient admission. Will start Librium PRN as per CIWA protocol to address risk of ETOH WDL. Continue Neurontin 300 mgrs BID, which she states she is tolerating well . Patient reports that she has been doing well on Vilazidone, without side effects, but this medication not currently on formulary here,have left message for pharmacist , and patient may be able to have someone bring her medication from  home.

## 2018-10-25 NOTE — ED Notes (Signed)
Slept very little this shift. States she has always been a night owl. Slept restlessly the last two and a half hours of this shift. She has had two 1 mg doses of Ativan this shift along with one dose of Zofran for nausea. She has a bed at [facility] pending a negative COVID test which is still pending.
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
## 2018-10-25 NOTE — BH Assessment (Signed)
[place] Assessment Progress Note  Per Anette Riedel, NP, this pt requires psychiatric hospitalization at this time.  Letit[facility] Libra, RN, [facility] Rm 303[facility]2.  Pt has signed Voluntary Admission and Consent for Treatment, as well as Consent to Release Information to pt's parents, and signed forms have been faxed to [facility].  Pt's nurse, Nena Jordan, has been notified, and agrees to send original paperwork along with pt via [employer] [facility], and to call report to [phone].  Jalene Mullet, [place] Coordinator [phone]

## 2018-10-25 NOTE — ED Notes (Signed)
COVID test seems to be taking a longer time than usual, called the lab to check on its status as it is the test resulting as negative that will allow her to go across the street to [facility]. Lab tech informed Probation officer test went to [facility] because it was marked as the 24 hour test. Called AC and TTS counselor to inform them of the hold up. THey will hold her bed.

## 2018-10-25 NOTE — Progress Notes (Signed)
D: Pt was in her room upon initial approach.  Pt presents with anxious, depressed affect and mood.  She reports her day has been "up and down.  I feel better now that I've eaten and I showered."  Her goal tonight is to "get a good nights sleep."  Pt denies SI/HI, denies hallucinations, denies pain.  Pt has been visible in milieu interacting with peers and staff appropriately.    A: Introduced self to pt.  Met with pt 1:1.  Actively listened to pt and offered support and encouragement.  PRN medication administered for anxiety, sleep, and nausea.  15 minute safety checks in place.  R: Pt is safe on the unit.  Pt is compliant with medications.  Pt verbally contracts for safety.  Will continue to monitor and assess.

## 2018-10-25 NOTE — ED Notes (Signed)
On awakening to use the English as a second language teacher assessed her physical needs and she complained of nausea. Zofran given to her as ordered prn. She was informed at this time her COVID test resulted negative and day shift staff would be arranging for her to go to [facility]. She has been accepted by Anette Riedel NP and will be going to bed 303-2. She can go to [facility] anytime today after 0800.

## 2018-10-25 NOTE — ED Notes (Signed)
Started to complain of nausea, had done CIWA few minutes earlier and she had no complaints. Now she is also endorsing feeling anxious and sweaty. Too soon for Zofran per order. Ativan available related to her CIWA score of 7. Unable to sleep at this time but has remained calm and cooperative
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
## 2018-10-26 LAB — TSH: TSH: 7.125 u[IU]/mL — ABNORMAL HIGH (ref 0.350–4.500)

## 2018-10-26 LAB — HEMOGLOBIN A1C
Hgb A1c MFr Bld: 5.3 % (ref 4.8–5.6)
Mean Plasma Glucose: 105.41 mg/dL

## 2018-10-26 MED ORDER — MIRTAZAPINE 15 MG PO TBDP
15.0000 mg | ORAL_TABLET | Freq: Every evening | ORAL | Status: DC | PRN
  Administered 2018-10-26 – 2018-10-28 (×4): 15 mg via ORAL
  Filled 2018-10-26 (×12): qty 1

## 2018-10-26 NOTE — Progress Notes (Signed)
D: Pt was in dayroom upon initial approach.  Pt presents with appropriate affect and mood. When asked how her day has been, she states "good.  Kind of slow, but it has gotten a little better."  Pt requests medication to help her sleep tonight.  Pt denies SI/HI, denies hallucinations, denies pain.  Pt has been visible in milieu interacting with peers and staff appropriately.    A: Met with pt 1:1.  Actively listened to pt and offered support and encouragement. On-site provider notified of pt's request, EKG from 10/26/18 was reviewed by on-site provider, and new orders were placed.  Medication administered per order.  15 minute safety checks in place.  R: Pt is safe on the unit.  Pt is compliant with medications.  Pt verbally contracts for safety.  Will continue to monitor and assess.

## 2018-10-26 NOTE — Progress Notes (Addendum)
[facility] MD Progress Note  10/26/2018 3:42 PM Aimee Thompson  MRN:  [id number] Subjective: Patient reports she is feeling better.  Currently denies significant symptoms of withdrawal. Denies suicidal ideations.  Objective: I discussed case with treatment team and have met with patient.  48 year old female, presented to ED brought by parents.  Reportedly had overdosed and possibly attempted to strangle herself in the context of an alcohol-related blackout.  Patient states she does not remember any of these events.  She did acknowledge recent depression in the context of relationship stressors/significant other recently moving out of the house.  She has a history of alcohol abuse and had relapsed in April 2020, was drinking 1-2 bottles of wine per day.  Today patient presents calm, pleasant on approach, does not appear to be in any acute distress or discomfort.  No significant tremors or psychomotor agitation.  No diaphoresis.  Vitals are currently stable. Today denies any suicidal ideations and states her mood is "okay".  Denies feeling depressed at this time and presents with a full range of affect. Behavior on unit in good control, visible in dayroom, pleasant on approach.  Labs reviewed-hemoglobin A1c 5.3.  TSH is elevated at 7.12.  Patient denies known history of hypothyroidism or thyroid disease  Principal Problem: Alcohol use disorder, alcohol induced mood disorder Diagnosis: Active Problems:   MDD (major depressive disorder), recurrent severe, without psychosis ([place])   Alcohol dependence with alcohol-induced mood disorder ([place])  Total Time spent with patient: 20 minutes  Past Psychiatric History:   Past Medical History:  Past Medical History:  Diagnosis Date  . ADD (attention deficit disorder)   . ASTHMA   . EtOH dependence ([place])   . HEART MURMUR, HX OF   . Hypertension   . Hypertriglyceridemia, familial     Past Surgical History:  Procedure Laterality Date  . CESAREAN SECTION   2011  . KNEE SURGERY  1990  . laproscopy    . SHOULDER SURGERY Right 2012   Family History:  Family History  Problem Relation Age of Onset  . Hypertension Other   . Colon cancer Neg Hx   . Esophageal cancer Neg Hx   . Pancreatic cancer Neg Hx   . Colitis Neg Hx   . Crohn's disease Neg Hx   . Kidney disease Neg Hx   . Liver disease Neg Hx   . Heart disease Neg Hx   . Stomach cancer Neg Hx    Family Psychiatric  History:  Social History:  Social History   Substance and Sexual Activity  Alcohol Use Yes  . Alcohol/week: 0.0 standard drinks   Comment: 1-2 bottles of wine/day     Social History   Substance and Sexual Activity  Drug Use No    Social History   Socioeconomic History  . Marital status: Single    Spouse name: Not on file  . Number of children: Not on file  . Years of education: Not on file  . Highest education level: Not on file  Occupational History  . Occupation: homemaker  Social Needs  . Financial resource strain: Not on file  . Food insecurity    Worry: Not on file    Inability: Not on file  . Transportation needs    Medical: Not on file    Non-medical: Not on file  Tobacco Use  . Smoking status: Never Smoker  . Smokeless tobacco: Never Used  Substance and Sexual Activity  . Alcohol use: Yes    Alcohol/week:  0.0 standard drinks    Comment: 1-2 bottles of wine/day  . Drug use: No  . Sexual activity: Yes    Birth control/protection: None  Lifestyle  . Physical activity    Days per week: Not on file    Minutes per session: Not on file  . Stress: Not on file  Relationships  . Social Herbalist on phone: Not on file    Gets together: Not on file    Attends religious service: Not on file    Active member of club or organization: Not on file    Attends meetings of clubs or organizations: Not on file    Relationship status: Not on file  Other Topics Concern  . Not on file  Social History Narrative  . Not on file   Additional  Social History:   Sleep: Improving  Appetite:  Improving  Current Medications: Current Facility-Administered Medications  Medication Dose Route Frequency Provider Last Rate Last Dose  . acetaminophen (TYLENOL) tablet 650 mg  650 mg Oral Q6H PRN Dixon, Rashaun M, NP      . alum & mag hydroxide-simeth (MAALOX/MYLANTA) 200-200-20 MG/5ML suspension 30 mL  30 mL Oral Q4H PRN Dixon, Rashaun M, NP      . chlordiazePOXIDE (LIBRIUM) capsule 25 mg  25 mg Oral Q6H PRN Mckinnley Cottier A, MD      . gabapentin (NEURONTIN) capsule 300 mg  300 mg Oral BID Shanen Norris, Myer Peer, MD   300 mg at 10/26/18 0817  . hydrOXYzine (ATARAX/VISTARIL) tablet 25 mg  25 mg Oral Q6H PRN Alonnah Lampkins, Myer Peer, MD   25 mg at 10/26/18 0818  . loperamide (IMODIUM) capsule 2-4 mg  2-4 mg Oral PRN Mateya Torti, Myer Peer, MD      . losartan (COZAAR) tablet 100 mg  100 mg Oral Daily Jazma Pickel, Myer Peer, MD   100 mg at 10/26/18 0817  . magnesium hydroxide (MILK OF MAGNESIA) suspension 30 mL  30 mL Oral Daily PRN Deloria Lair, NP      . multivitamin with minerals tablet 1 tablet  1 tablet Oral Daily Bradley Bostelman, Myer Peer, MD   1 tablet at 10/26/18 0817  . ondansetron (ZOFRAN-ODT) disintegrating tablet 4 mg  4 mg Oral Q6H PRN Ashantia Amaral, Myer Peer, MD   4 mg at 10/25/18 2100  . thiamine (B-1) injection 100 mg  100 mg Intramuscular Once Andersyn Fragoso A, MD      . thiamine (VITAMIN B-1) tablet 100 mg  100 mg Oral Daily Carley Strickling, Myer Peer, MD   100 mg at 10/26/18 0817  . traZODone (DESYREL) tablet 50 mg  50 mg Oral QHS PRN Deloria Lair, NP   50 mg at 10/25/18 2233  . Vilazodone HCl (VIIBRYD) TABS 20 mg  20 mg Oral Daily Dariann Huckaba, Myer Peer, MD   20 mg at 10/26/18 4174    Lab Results:  Results for orders placed or performed during the hospital encounter of 10/25/18 (from the past 48 hour(s))  Hemoglobin A1c     Status: None   Collection Time: 10/26/18  6:31 AM  Result Value Ref Range   Hgb A1c MFr Bld 5.3 4.8 - 5.6 %    Comment: (NOTE) Pre  diabetes:          5.7%-6.4% Diabetes:              >6.4% Glycemic control for   <7.0% adults with diabetes    Mean Plasma Glucose 105.41 mg/dL  Comment: Performed at [facility], [place] [address] [place], [place] [address]  TSH     Status: Abnormal   Collection Time: 10/26/18  6:31 AM  Result Value Ref Range   TSH 7.125 (H) 0.350 - 4.500 uIU/mL    Comment: Performed by a 3rd Generation assay with a functional sensitivity of <=0.01 uIU/mL. Performed at [facility], [place] [address] [place], [place] [address]     Blood Alcohol level:  Lab Results  Component Value Date   ETH 375 [facility]) 10/24/2018   ETH 251 (H) 17/49/4496    Metabolic Disorder Labs: Lab Results  Component Value Date   HGBA1C 5.3 10/26/2018   MPG 105.41 10/26/2018   MPG 100 10/07/2015   No results found for: PROLACTIN Lab Results  Component Value Date   CHOL 177 10/20/2016   TRIG 147.0 10/20/2016   HDL 67.50 10/20/2016   CHOLHDL 3 10/20/2016   VLDL 29.4 10/20/2016   LDLCALC 80 10/20/2016   LDLCALC 115 (H) 10/29/2015    Physical Findings: AIMS: Facial and Oral Movements Muscles of Facial Expression: None, normal Lips and Perioral Area: None, normal Jaw: None, normal Tongue: None, normal,Extremity Movements Upper (arms, wrists, hands, fingers): None, normal Lower (legs, knees, ankles, toes): None, normal, Trunk Movements Neck, shoulders, hips: None, normal, Overall Severity Severity of abnormal movements (highest score from questions above): None, normal Incapacitation due to abnormal movements: None, normal Patient's awareness of abnormal movements (rate only patient's report): No Awareness, Dental Status Current problems with teeth and/or dentures?: No Does patient usually wear dentures?: No  CIWA:  CIWA-Ar Total: 0 COWS:     Musculoskeletal: Strength & Muscle Tone: within normal limits-no current tremors or diaphoresis, no psychomotor agitation or restlessness Gait &  Station: normal Patient leans: N/A  Psychiatric Specialty Exam: Physical Exam  ROS denies chest pain or shortness of breath, no cough, no fever, no chills  Blood pressure (!) 141/85, pulse 68, temperature 98.6 F (37 C), temperature source Oral, resp. rate 18, height 5' 2"  (1.575 m), weight 65.3 kg.Body mass index is 26.34 kg/m.  General Appearance: Well Groomed  Eye Contact:  Good  Speech:  Normal Rate  Volume:  Normal  Mood:  Improving mood, reports she is feeling "better".  At this time presents euthymic  Affect:  Appropriate and Full Range  Thought Process:  Linear and Descriptions of Associations: Intact  Orientation:  Full (Time, Place, and Person)  Thought Content:  No hallucinations, no delusions  Suicidal Thoughts:  No currently denies suicidal or self-injurious ideations, denies homicidal or violent ideations, contracts for safety on unit  Homicidal Thoughts:  No  Memory:  Recent and remote grossly intact  Judgement:  Other:  Improving  Insight:  Fair  Psychomotor Activity:  Normal  Concentration:  Concentration: Good and Attention Span: Good  Recall:  Good  Fund of Knowledge:  Good  Language:  Good  Akathisia:  Negative  Handed:  Right  AIMS (if indicated):     Assets:  Desire for Improvement Resilience  ADL's:  Intact  Cognition:  WNL  Sleep:  Number of Hours: 6.75    Assessment- 48 year old female, presented to ED brought by parents.  Reportedly had overdosed and possibly attempted to strangle herself in the context of an alcohol-related blackout.  Patient states she does not remember any of these events.  She did acknowledge recent depression in the context of relationship stressors/significant other recently moving out of the house.  She has a  history of alcohol abuse and had relapsed in April 2020, was drinking 1-2 bottles of wine per day.  At this time patient presents with improved mood and full range of affect.  Denies suicidal ideations.  Minimizes  neurovegetative symptoms or current depression symptoms.  She is not presenting with significant symptoms of withdrawal at this time.  Tolerating medications well (Viibryd, Neurontin) */5 EKG QTc 480. ( QTc prolongation was  present in and October /2016 EKG ( 517)     Treatment Plan Summary: Daily contact with patient to assess and evaluate symptoms and progress in treatment, Medication management, Plan Inpatient treatment and Medications as below Encourage group and milieu participation to work on coping skills and symptom reduction Encourage efforts to work on sobriety and relapse prevention Continue Neurontin at 300 mg twice daily for anxiety/pain/alcohol use disorder Continue Viibryd 20 mg daily for depression/anxiety Continue Losartan 100 mg daily for hypertension D/C Vistaril, Zofran,  and Trazodone PRNs  Continue Librium PRN's as per CIWA protocol as needed for alcohol withdrawal symptoms Treatment team working on disposition planning options.  Check T3 and T4 to follow-up on elevated TSH Recheck EKG to follow QTc interval  Jenne Campus, MD 10/26/2018, 3:42 PM

## 2018-10-26 NOTE — Plan of Care (Signed)
  Problem: Education: Goal: Knowledge of [place] [facility] Education information/materials will improve Outcome: Progressing   Problem: Activity: Goal: Interest or engagement in activities will improve Outcome: Progressing   Problem: Health Behavior/Discharge Planning: Goal: Compliance with treatment plan for underlying cause of condition will improve Outcome: Progressing   Problem: Physical Regulation: Goal: Ability to maintain clinical measurements within normal limits will improve Outcome: Progressing   Problem: Safety: Goal: Periods of time without injury will increase Outcome: Progressing   

## 2018-10-26 NOTE — Tx Team (Signed)
Interdisciplinary Treatment and Diagnostic Plan Update  10/26/2018 Time of Session: 9:00am Aimee Heallizabeth W Villella MRN: [id number]  Principal Diagnosis: <principal problem not specified>  Secondary Diagnoses: Active Problems:   MDD (major depressive disorder), recurrent severe, without psychosis (HCC)   Alcohol dependence with alcohol-induced mood disorder (HCC)   Current Medications:  Current Facility-Administered Medications  Medication Dose Route Frequency Provider Last Rate Last Dose  . acetaminophen (TYLENOL) tablet 650 mg  650 mg Oral Q6H PRN Aimee Thompson, Aimee Thompson, Aimee Thompson      . alum & mag hydroxide-simeth (MAALOX/MYLANTA) 200-200-20 MG/5ML suspension 30 mL  30 mL Oral Q4H PRN Aimee Thompson, Aimee Thompson, Aimee Thompson      . chlordiazePOXIDE (LIBRIUM) capsule 25 mg  25 mg Oral Q6H PRN Thompson, Aimee Thompson, Aimee Thompson      . gabapentin (NEURONTIN) capsule 300 mg  300 mg Oral BID Thompson, Aimee SituFernando Thompson, Aimee Thompson   300 mg at 10/26/18 0817  . hydrOXYzine (ATARAX/VISTARIL) tablet 25 mg  25 mg Oral Q6H PRN Thompson, Aimee SituFernando Thompson, Aimee Thompson   25 mg at 10/26/18 0818  . loperamide (IMODIUM) capsule 2-4 mg  2-4 mg Oral PRN Thompson, Aimee SituFernando Thompson, Aimee Thompson      . losartan (COZAAR) tablet 100 mg  100 mg Oral Daily Thompson, Aimee SituFernando Thompson, Aimee Thompson   100 mg at 10/26/18 0817  . magnesium hydroxide (MILK OF MAGNESIA) suspension 30 mL  30 mL Oral Daily PRN Aimee Thompson, Aimee Thompson, Aimee Thompson      . multivitamin with minerals tablet 1 tablet  1 tablet Oral Daily Thompson, Aimee SituFernando Thompson, Aimee Thompson   1 tablet at 10/26/18 0817  . ondansetron (ZOFRAN-ODT) disintegrating tablet 4 mg  4 mg Oral Q6H PRN Thompson, Aimee SituFernando Thompson, Aimee Thompson   4 mg at 10/25/18 2100  . thiamine (B-1) injection 100 mg  100 mg Intramuscular Once Thompson, Aimee Thompson, Aimee Thompson      . thiamine (VITAMIN B-1) tablet 100 mg  100 mg Oral Daily Thompson, Aimee SituFernando Thompson, Aimee Thompson   100 mg at 10/26/18 0817  . traZODone (DESYREL) tablet 50 mg  50 mg Oral QHS PRN Aimee Thompson, Aimee Thompson, Aimee Thompson   50 mg at 10/25/18 2233  . Vilazodone HCl (VIIBRYD) TABS 20 mg  20 mg Oral Daily Thompson, Aimee SituFernando Thompson, Aimee Thompson   20  mg at 10/26/18 [id number]   PTA Medications: Medications Prior to Admission  Medication Sig Dispense Refill Last Dose  . albuterol (PROVENTIL HFA;VENTOLIN HFA) 108 (90 Base) MCG/ACT inhaler Inhale 2 puffs into the lungs every 6 (six) hours as needed for wheezing or shortness of breath. 1 Inhaler 5   . B Complex-C (SUPER B COMPLEX/VITAMIN C PO) Take 1 tablet by mouth daily.     . budesonide-formoterol (SYMBICORT) 160-4.5 MCG/ACT inhaler Inhale 2 puffs into the lungs 2 (two) times daily. For Shortness of breath (Patient taking differently: Inhale 2 puffs into the lungs 2 (two) times daily as needed (For shortness of breath.). ) 1 Inhaler 6   . cholecalciferol (VITAMIN D3) 25 MCG (1000 UT) tablet Take 1,000 Units by mouth daily.     . cholecalciferol (VITAMIN D3) 25 MCG (1000 UT) tablet Take 1,000 Units by mouth daily.     Marland Kitchen. escitalopram (LEXAPRO) 20 MG tablet TAKE 1 TABLET BY MOUTH ONCE DAILY (Patient taking differently: Take 20 mg by mouth daily. ) 90 tablet 3   . fenofibrate 160 MG tablet Take 1 tablet (160 mg total) by mouth daily. 90 tablet 3   . gabapentin (NEURONTIN) 300 MG capsule Take 300 mg by mouth 2 (  two) times Thompson day.     . losartan (COZAAR) 100 MG tablet Take 1 tablet (100 mg total) by mouth daily. 90 tablet 3   . magnesium oxide (MAG-OX) 400 MG tablet Take 400 mg by mouth daily.     . Vilazodone HCl (VIIBRYD) 40 MG TABS Take 40 mg by mouth daily.     . vitamin B-12 (CYANOCOBALAMIN) 100 MCG tablet Take 100 mcg by mouth daily.       Patient Stressors: Financial difficulties Marital or family conflict Occupational concerns Substance abuse  Patient Strengths: Ability for insight Active sense of humor Average or above average intelligence Capable of independent living Communication skills General fund of knowledge Motivation for treatment/growth Physical Health Supportive family/friends Work skills  Treatment Modalities: Medication Management, Group therapy, Case management,  1 to  1 session with clinician, Psychoeducation, Recreational therapy.   Physician Treatment Plan for Primary Diagnosis: <principal problem not specified> Long Term Goal(s): Improvement in symptoms so as ready for discharge Improvement in symptoms so as ready for discharge   Short Term Goals: Ability to identify changes in lifestyle to reduce recurrence of condition will improve Ability to verbalize feelings will improve Ability to disclose and discuss suicidal ideas Ability to demonstrate self-control will improve Ability to identify and develop effective coping behaviors will improve  Medication Management: Evaluate patient's response, side effects, and tolerance of medication regimen.  Therapeutic Interventions: 1 to 1 sessions, Unit Group sessions and Medication administration.  Evaluation of Outcomes: Progressing  Physician Treatment Plan for Secondary Diagnosis: Active Problems:   MDD (major depressive disorder), recurrent severe, without psychosis ([place])   Alcohol dependence with alcohol-induced mood disorder ([place])  Long Term Goal(s): Improvement in symptoms so as ready for discharge Improvement in symptoms so as ready for discharge   Short Term Goals: Ability to identify changes in lifestyle to reduce recurrence of condition will improve Ability to verbalize feelings will improve Ability to disclose and discuss suicidal ideas Ability to demonstrate self-control will improve Ability to identify and develop effective coping behaviors will improve     Medication Management: Evaluate patient's response, side effects, and tolerance of medication regimen.  Therapeutic Interventions: 1 to 1 sessions, Unit Group sessions and Medication administration.  Evaluation of Outcomes: Progressing   Aimee Thompson Treatment Plan for Primary Diagnosis: <principal problem not specified> Long Term Goal(s): Knowledge of disease and therapeutic regimen to maintain health will improve  Short Term Goals:  Ability to verbalize feelings will improve, Ability to identify and develop effective coping behaviors will improve and Compliance with prescribed medications will improve  Medication Management: Aimee Thompson will administer medications as ordered by provider, will assess and evaluate patient's response and provide education to patient for prescribed medication. Aimee Thompson will report any adverse and/or side effects to prescribing provider.  Therapeutic Interventions: 1 on 1 counseling sessions, Psychoeducation, Medication administration, Evaluate responses to treatment, Monitor vital signs and CBGs as ordered, Perform/monitor CIWA, COWS, AIMS and Fall Risk screenings as ordered, Perform wound care treatments as ordered.  Evaluation of Outcomes: Progressing   LCSW Treatment Plan for Primary Diagnosis: <principal problem not specified> Long Term Goal(s): Safe transition to appropriate next level of care at discharge, Engage patient in therapeutic group addressing interpersonal concerns.  Short Term Goals: Engage patient in aftercare planning with referrals and resources, Increase social support, Increase emotional regulation, Identify triggers associated with mental health/substance abuse issues and Increase skills for wellness and recovery  Therapeutic Interventions: Assess for all discharge needs, 1 to 1 time with Social  worker, Explore available resources and support systems, Assess for adequacy in community support network, Educate family and significant other(s) on suicide prevention, Complete Psychosocial Assessment, Interpersonal group therapy.  Evaluation of Outcomes: Progressing   Progress in Treatment: Attending groups: Yes. Participating in groups: Yes. Taking medication as prescribed: Yes. Toleration medication: Yes. Family/Significant other contact made: No, will contact:  supports if consents are granted Patient understands diagnosis: Yes. Discussing patient identified problems/goals with staff:  Yes. Medical problems stabilized or resolved: Yes. Denies suicidal/homicidal ideation: No. Issues/concerns per patient self-inventory: Yes.  New problem(s) identified: Yes, Describe:  legal issues- DUI court date in October, separation from spouse, spouse has custody of the children  New Short Term/Long Term Goal(s): detox, medication management for mood stabilization; elimination of SI thoughts; development of comprehensive mental wellness/sobriety plan.  Patient Goals:  Get help for depression, anxiety, and ETOH abuse  Discharge Plan or Barriers: CSW continuing to assess for appropriate referrals.  Reason for Continuation of Hospitalization: Anxiety Depression Suicidal ideation Withdrawal symptoms  Estimated Length of Stay: 3-5 days  Attendees: Patient: Aimee Goldmannlizabeth Linenberger 10/26/2018 8:52 AM  Physician: Aimee Dockerr.Thompson 10/26/2018 8:52 AM  Nursing: Aimee OmanAmanada, Aimee Thompson 10/26/2018 8:52 AM  Aimee Thompson Care Manager: 10/26/2018 8:52 AM  Social Worker: Enid Cutterharlotte Delana Manganello, LCSWA 10/26/2018 8:52 AM  Recreational Therapist:  10/26/2018 8:52 AM  Other:  10/26/2018 8:52 AM  Other:  10/26/2018 8:52 AM  Other: 10/26/2018 8:52 AM    Scribe for Treatment Team: Darreld Mcleanharlotte C Ammon Muscatello, LCSWA 10/26/2018 8:52 AM

## 2018-10-26 NOTE — Progress Notes (Signed)
Patient ID: Aimee Thompson, female   DOB: 08-06-1970, 48 y.o.   MRN: [id number]  Nursing Progress Note [id number]  Patient presents calm, pleasant and cooperative. Patient's affect is bright and she appear appropriate during interactions. No withdrawal reported/observed. Patient compliant with scheduled medications and requests PRN Vistaril for anxiety. Patient is seen attending groups and visible in the milieu. Patient currently denies SI/HI/AVH.  Patient safety maintained with q15 min safety checks.   Patient remains safe on the unit at this time. Will continue to support and monitor with plan of care.   Patient's self-inventory sheet Rated Energy Level  Normal  Rated Sleep  Fair  Rated Appetite  Good  Rated Anxiety (0-10)  4  Rated Hopelessness (0-10)  0  Rated Depression (0-10)  5  Daily Goal  "deal with stress and anxiety, think positive"  Any Additional Comments:

## 2018-10-27 DIAGNOSIS — F1094 Alcohol use, unspecified with alcohol-induced mood disorder: Secondary | ICD-10-CM

## 2018-10-27 LAB — T4, FREE: Free T4: 0.7 ng/dL (ref 0.61–1.12)

## 2018-10-27 MED ORDER — HYDROXYZINE HCL 25 MG PO TABS
25.0000 mg | ORAL_TABLET | Freq: Three times a day (TID) | ORAL | Status: DC | PRN
  Administered 2018-10-27 – 2018-10-29 (×4): 25 mg via ORAL
  Filled 2018-10-27 (×5): qty 1

## 2018-10-27 NOTE — Progress Notes (Signed)
Adult Psychoeducational Group Note  Date:  10/27/2018 Time:  9:24 PM  Group Topic/Focus:  Wrap-Up Group:   The focus of this group is to help patients review their daily goal of treatment and discuss progress on daily workbooks.  Participation Level:  Active  Participation Quality:  Appropriate and Attentive  Affect:  Appropriate  Cognitive:  Alert, Appropriate and Oriented  Insight: Appropriate  Engagement in Group:  Engaged  Modes of Intervention:  Discussion and Education  Additional Comments:  Pt attended and participated in group. Pt stated her goal today was to stay positive and work on her discharge plans. Pt reported completing her goal and rated her day an 8/10.   Milus Glazier 10/27/2018, 9:24 PM
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
## 2018-10-27 NOTE — Progress Notes (Addendum)
[facility] Note  10/27/2018 11:57 AM Aimee Thompson  MRN:  [id number]   Subjective: Patient reports that she is feeling much better today.  She states that she slept much better last night.  She denies any suicidal or homicidal ideations and denies any hallucinations.  She reports that she has a long history of alcohol abuse and that she has been treatment in the past but would prefer to do [facility] IOP.  She states that she is already set up with a bigger center and is working on her transportation and is hoping that her parents can assist her with that.  She reports that she lost her license due to a DUI and is supposed to get them back on August 19.  She denies any medication side effects.  She reports that her appetite is improving.  She asked about her thyroid levels as she is informed of her TSH been elevated at 7.125 but her other labs have not returned at the time of the interview.  Objective: Patient's chart and findings reviewed and discussed with treatment team.  Patient presents in the day room and is interacting with peers and staff appropriately.  Patient is pleasant, calm, and cooperative.  Patient is very pleasant to speak with.  Since the assessment the patient's T4 has came back at a WNL at 0.70.  Patient's QTC has came back at 473 in normal sinus rhythm.  Patient's medications seem to be assisting with her improvement.  Patient has shown significant improvement compared to previous reports in the chart review.  Patient is hoping to be discharged within the next 1 to 2 days.  Principal Problem: <principal problem not specified> Diagnosis: Active Problems:   MDD (major depressive disorder), recurrent severe, without psychosis (HCC)   Alcohol dependence with alcohol-induced mood disorder (HCC)  Total Time spent with patient: 20 minutes  Past Psychiatric History: See H&P  Past Medical History:  Past Medical History:  Diagnosis Date  . ADD (attention deficit disorder)   . ASTHMA    . EtOH dependence (HCC)   . HEART MURMUR, HX OF   . Hypertension   . Hypertriglyceridemia, familial     Past Surgical History:  Procedure Laterality Date  . CESAREAN SECTION  2011  . KNEE SURGERY  1990  . laproscopy    . SHOULDER SURGERY Right 2012   Family History:  Family History  Problem Relation Age of Onset  . Hypertension Other   . Colon cancer Neg Hx   . Esophageal cancer Neg Hx   . Pancreatic cancer Neg Hx   . Colitis Neg Hx   . Crohn's disease Neg Hx   . Kidney disease Neg Hx   . Liver disease Neg Hx   . Heart disease Neg Hx   . Stomach cancer Neg Hx    Family Psychiatric  History: See H&P Social History:  Social History   Substance and Sexual Activity  Alcohol Use Yes  . Alcohol/week: 0.0 standard drinks   Comment: 1-2 bottles of wine/day     Social History   Substance and Sexual Activity  Drug Use No    Social History   Socioeconomic History  . Marital status: Single    Spouse name: Not on file  . Number of children: Not on file  . Years of education: Not on file  . Highest education level: Not on file  Occupational History  . Occupation: homemaker  Social Needs  . Financial resource strain: Not on file  .  Food insecurity    Worry: Not on file    Inability: Not on file  . Transportation needs    Medical: Not on file    Non-medical: Not on file  Tobacco Use  . Smoking status: Never Smoker  . Smokeless tobacco: Never Used  Substance and Sexual Activity  . Alcohol use: Yes    Alcohol/week: 0.0 standard drinks    Comment: 1-2 bottles of wine/day  . Drug use: No  . Sexual activity: Yes    Birth control/protection: None  Lifestyle  . Physical activity    Days per week: Not on file    Minutes per session: Not on file  . Stress: Not on file  Relationships  . Social Musicianconnections    Talks on phone: Not on file    Gets together: Not on file    Attends religious service: Not on file    Active member of club or organization: Not on file     Attends meetings of clubs or organizations: Not on file    Relationship status: Not on file  Other Topics Concern  . Not on file  Social History Narrative  . Not on file   Additional Social History:                         Sleep: Good  Appetite:  Good  Current Medications: Current Facility-Administered Medications  Medication Dose Route Frequency Provider Last Rate Last Dose  . acetaminophen (TYLENOL) tablet 650 mg  650 mg Oral Q6H PRN Jearld Leschixon, Rashaun M, NP   650 mg at 10/27/18 0809  . alum & mag hydroxide-simeth (MAALOX/MYLANTA) 200-200-20 MG/5ML suspension 30 mL  30 mL Oral Q4H PRN Dixon, Rashaun M, NP      . chlordiazePOXIDE (LIBRIUM) capsule 25 mg  25 mg Oral Q6H PRN Cobos, Fernando A, MD      . gabapentin (NEURONTIN) capsule 300 mg  300 mg Oral BID Cobos, Rockey SituFernando A, MD   300 mg at 10/27/18 0807  . hydrOXYzine (ATARAX/VISTARIL) tablet 25 mg  25 mg Oral TID PRN Money, Gerlene Burdockravis B, FNP      . losartan (COZAAR) tablet 100 mg  100 mg Oral Daily Cobos, Rockey SituFernando A, MD   100 mg at 10/27/18 0807  . magnesium hydroxide (MILK OF MAGNESIA) suspension 30 mL  30 mL Oral Daily PRN Dixon, Rashaun M, NP      . mirtazapine (REMERON SOL-TAB) disintegrating tablet 15 mg  15 mg Oral QHS,MR X 1 Kerry HoughSimon, Spencer E, PA-C   15 mg at 10/26/18 2227  . multivitamin with minerals tablet 1 tablet  1 tablet Oral Daily Cobos, Rockey SituFernando A, MD   1 tablet at 10/27/18 0807  . thiamine (B-1) injection 100 mg  100 mg Intramuscular Once Cobos, Fernando A, MD      . thiamine (VITAMIN B-1) tablet 100 mg  100 mg Oral Daily Cobos, Rockey SituFernando A, MD   100 mg at 10/27/18 0807  . Vilazodone HCl (VIIBRYD) TABS 20 mg  20 mg Oral Daily Cobos, Rockey SituFernando A, MD   20 mg at 10/27/18 0808    Lab Results:  Results for orders placed or performed during the hospital encounter of 10/25/18 (from the past 48 hour(s))  Hemoglobin A1c     Status: None   Collection Time: 10/26/18  6:31 AM  Result Value Ref Range   Hgb A1c MFr Bld 5.3 4.8  - 5.6 %    Comment: (NOTE) Pre diabetes:  5.7%-6.4% Diabetes:              >6.4% Glycemic control for   <7.0% adults with diabetes    Mean Plasma Glucose 105.41 mg/dL    Comment: Performed at [facility] [address], [place], [place] [address]  TSH     Status: Abnormal   Collection Time: 10/26/18  6:31 AM  Result Value Ref Range   TSH 7.125 (H) 0.350 - 4.500 uIU/mL    Comment: Performed by a 3rd Generation assay with a functional sensitivity of <=0.01 uIU/mL. Performed at [facility], [place] [address] [place], [place] [address]   T4, free     Status: None   Collection Time: 10/27/18  6:43 AM  Result Value Ref Range   Free T4 0.70 0.61 - 1.12 ng/dL    Comment: (NOTE) Biotin ingestion may interfere with free T4 tests. If the results are inconsistent with the TSH level, previous test results, or the clinical presentation, then consider biotin interference. If needed, order repeat testing after stopping biotin. Performed at [facility], [place] [address], [place], [place] [address]     Blood Alcohol level:  Lab Results  Component Value Date   ETH 375 [facility]) 10/24/2018   ETH 251 (H) 33/29/5188    Metabolic Disorder Labs: Lab Results  Component Value Date   HGBA1C 5.3 10/26/2018   MPG 105.41 10/26/2018   MPG 100 10/07/2015   No results found for: PROLACTIN Lab Results  Component Value Date   CHOL 177 10/20/2016   TRIG 147.0 10/20/2016   HDL 67.50 10/20/2016   CHOLHDL 3 10/20/2016   VLDL 29.4 10/20/2016   LDLCALC 80 10/20/2016   LDLCALC 115 (H) 10/29/2015    Physical Findings: AIMS: Facial and Oral Movements Muscles of Facial Expression: None, normal Lips and Perioral Area: None, normal Jaw: None, normal Tongue: None, normal,Extremity Movements Upper (arms, wrists, hands, fingers): None, normal Lower (legs, knees, ankles, toes): None, normal, Trunk Movements Neck, shoulders, hips: None, normal, Overall Severity Severity  of abnormal movements (highest score from questions above): None, normal Incapacitation due to abnormal movements: None, normal Patient's awareness of abnormal movements (rate only patient's report): No Awareness, Dental Status Current problems with teeth and/or dentures?: No Does patient usually wear dentures?: No  CIWA:  CIWA-Ar Total: 1 COWS:     Musculoskeletal: Strength & Muscle Tone: within normal limits Gait & Station: normal Patient leans: N/A  Psychiatric Specialty Exam: Physical Exam  Nursing note and vitals reviewed. Constitutional: She is oriented to person, place, and time. She appears well-developed and well-nourished.  Respiratory: Effort normal.  Musculoskeletal: Normal range of motion.  Neurological: She is alert and oriented to person, place, and time.    Review of Systems  Constitutional: Negative.   HENT: Negative.   Eyes: Negative.   Respiratory: Negative.   Cardiovascular: Negative.   Gastrointestinal: Negative.   Genitourinary: Negative.   Musculoskeletal: Negative.   Skin: Negative.   Neurological: Negative.   Endo/Heme/Allergies: Negative.   Psychiatric/Behavioral: Positive for substance abuse.    Blood pressure (!) 157/94, pulse (!) 57, temperature 98.1 F (36.7 C), temperature source Oral, resp. rate 16, height 5\' 2"  (1.575 m), weight 65.3 kg, SpO2 100 %.Body mass index is 26.34 kg/m.  General Appearance: Casual  Eye Contact:  Good  Speech:  Clear and Coherent and Normal Rate  Volume:  Normal  Mood:  Euthymic  Affect:  Congruent  Thought Process:  Coherent and Descriptions of  Associations: Intact  Orientation:  Full (Time, Place, and Person)  Thought Content:  WDL  Suicidal Thoughts:  No  Homicidal Thoughts:  No  Memory:  Immediate;   Good Recent;   Good Remote;   Good  Judgement:  Good  Insight:  Good  Psychomotor Activity:  Normal  Concentration:  Concentration: Good  Recall:  Good  Fund of Knowledge:  Good  Language:  Good   Akathisia:  No  Handed:  Right  AIMS (if indicated):     Assets:  Communication Skills Desire for Improvement Financial Resources/Insurance Housing Resilience Social Support Transportation  ADL's:  Intact  Cognition:  WNL  Sleep:  Number of Hours: 6.75   Problems addressed MDD severe recurrent without psychosis Alcohol dependence with alcohol induced mood disorder  Treatment Plan Summary: Daily contact with patient to assess and evaluate symptoms and progress in treatment, Medication management and Plan is to: Continue Neurontin 300 mg p.o. twice daily for withdrawal symptoms Continue Remeron 15 mg p.o. nightly for sleep and depression Continue Viibryd 20 mg p.o. daily for MDD Continue Librium as needed detox protocol Encourage group therapy participation Continue every 15 minute safety checks Patient's BP os slightly elevated but the patient reports that this about normal for her. Will continue to monitor and evaluate medication changes to control BP  Maryfrances Bunnellravis B Money, FNP 10/27/2018, 11:57 AM   Attest to NP progress note

## 2018-10-27 NOTE — Plan of Care (Signed)
Progress notes  D: pt found in the hallway; compliant with medication administration. Pt is pleasant and bright. Pt has complaints of anxiety and abdominal cramping. Pt has no other complaints. Pt has been viewed interacting in the milieu with others. Pt denies si/hi/ah/vh and verbally agrees to approach staff if these become apparent or before harming himself/others while at [facility]: Pt provided support and encouragement. Pt given medication per protocol and standing orders. Q61m safety checks implemented and continued.  R: Pt safe on the unit. Will continue to monitor.  Pt progressing in the following metrics  Problem: Education: Goal: Emotional status will improve Outcome: Progressing Goal: Mental status will improve Outcome: Progressing Goal: Verbalization of understanding the information provided will improve Outcome: Progressing

## 2018-10-28 LAB — T3, FREE: T3, Free: 3.1 pg/mL (ref 2.0–4.4)

## 2018-10-28 MED ORDER — BACITRACIN-NEOMYCIN-POLYMYXIN OINTMENT TUBE
TOPICAL_OINTMENT | CUTANEOUS | Status: AC
  Administered 2018-10-28: 1 via TOPICAL
  Filled 2018-10-28: qty 14.17

## 2018-10-28 MED ORDER — BACITRACIN-NEOMYCIN-POLYMYXIN OINTMENT TUBE
TOPICAL_OINTMENT | CUTANEOUS | Status: DC | PRN
  Administered 2018-10-28: 1 via TOPICAL

## 2018-10-28 NOTE — Progress Notes (Signed)
Patient ID: Aimee Thompson, female   DOB: [birth year], 48 y.o.   MRN: [id number]  Nursing Progress Note [id number]  Patient presents calm, pleasant and cooperative. Patient compliant with scheduled medications. Patient is in no withdrawal or distress. Patient is seen attending groups and visible in the milieu. Patient currently denies SI/HI/AVH. Patient requested PRN Tylenol for abdominal cramps related to her monthly cycle. Patient with a healing cut to the tip of her thumb from prior to admission- neosporin applied and covered with a band-aid.  Patient safety maintained with q15 min safety checks. Medications provided as ordered.  Patient remains safe on the unit at this time. Will continue to support and monitor with plan of care.   Patient's self-inventory sheet Rated Energy Level  Normal  Rated Sleep  Good  Rated Appetite  Good  Rated Anxiety (0-10)  6  Rated Hopelessness (0-10)  0  Rated Depression (0-10)  5  Daily Goal  "to get a financial picture put together, to be positive"  Any Additional Comments:

## 2018-10-28 NOTE — Progress Notes (Addendum)
[facility] MD Progress Note  10/28/2018 1:52 PM Aimee Thompson  MRN:  [id number]   Subjective: Patient states that she is feeling much better today.  She states that the medications are working very well for her and she has not had any issues with those medications.  She reports that the plan is still for her to go home with her parents and they will be the ones picking her up from the hospital.  She states that she will plan on doing her follow-up as she should.  She denies any medication side effects and reports that she has been eating and sleeping well.  Objective: Patient's chart and findings reviewed and discussed with treatment team.  Patient is in the day room again today and has been seen interacting with peers and staff appropriately.  Patient is pleasant, calm, and cooperative.  Patient has a very congruent affect today and is seen smiling and laughing with multiple people today.  Patient stated that there was no overnight complaints and feels that she is ready for discharge.  Would suspect patient to be discharged tomorrow with safety plan as her parents are going to be the ones picking her up.  Reviewed patient's labs and patient's T3 is at 3.1 and T4 is at 0.70 both within normal limits.  Principal Problem: <principal problem not specified> Diagnosis: Active Problems:   MDD (major depressive disorder), recurrent severe, without psychosis (HCC)   Alcohol dependence with alcohol-induced mood disorder (HCC)  Total Time spent with patient: 20 minutes  Past Psychiatric History: See H&P  Past Medical History:  Past Medical History:  Diagnosis Date  . ADD (attention deficit disorder)   . ASTHMA   . EtOH dependence (HCC)   . HEART MURMUR, HX OF   . Hypertension   . Hypertriglyceridemia, familial     Past Surgical History:  Procedure Laterality Date  . CESAREAN SECTION  2011  . KNEE SURGERY  1990  . laproscopy    . SHOULDER SURGERY Right 2012   Family History:  Family History   Problem Relation Age of Onset  . Hypertension Other   . Colon cancer Neg Hx   . Esophageal cancer Neg Hx   . Pancreatic cancer Neg Hx   . Colitis Neg Hx   . Crohn's disease Neg Hx   . Kidney disease Neg Hx   . Liver disease Neg Hx   . Heart disease Neg Hx   . Stomach cancer Neg Hx    Family Psychiatric  History: See H&P Social History:  Social History   Substance and Sexual Activity  Alcohol Use Yes  . Alcohol/week: 0.0 standard drinks   Comment: 1-2 bottles of wine/day     Social History   Substance and Sexual Activity  Drug Use No    Social History   Socioeconomic History  . Marital status: Single    Spouse name: Not on file  . Number of children: Not on file  . Years of education: Not on file  . Highest education level: Not on file  Occupational History  . Occupation: homemaker  Social Needs  . Financial resource strain: Not on file  . Food insecurity    Worry: Not on file    Inability: Not on file  . Transportation needs    Medical: Not on file    Non-medical: Not on file  Tobacco Use  . Smoking status: Never Smoker  . Smokeless tobacco: Never Used  Substance and Sexual Activity  . Alcohol  use: Yes    Alcohol/week: 0.0 standard drinks    Comment: 1-2 bottles of wine/day  . Drug use: No  . Sexual activity: Yes    Birth control/protection: None  Lifestyle  . Physical activity    Days per week: Not on file    Minutes per session: Not on file  . Stress: Not on file  Relationships  . Social Musicianconnections    Talks on phone: Not on file    Gets together: Not on file    Attends religious service: Not on file    Active member of club or organization: Not on file    Attends meetings of clubs or organizations: Not on file    Relationship status: Not on file  Other Topics Concern  . Not on file  Social History Narrative  . Not on file   Additional Social History:                         Sleep: Good  Appetite:  Good  Current  Medications: Current Facility-Administered Medications  Medication Dose Route Frequency Provider Last Rate Last Dose  . acetaminophen (TYLENOL) tablet 650 mg  650 mg Oral Q6H PRN Jearld Leschixon, Rashaun M, NP   650 mg at 10/28/18 0818  . alum & mag hydroxide-simeth (MAALOX/MYLANTA) 200-200-20 MG/5ML suspension 30 mL  30 mL Oral Q4H PRN Dixon, Rashaun M, NP      . chlordiazePOXIDE (LIBRIUM) capsule 25 mg  25 mg Oral Q6H PRN Karime Scheuermann A, MD      . gabapentin (NEURONTIN) capsule 300 mg  300 mg Oral BID Dewitt Judice, Rockey SituFernando A, MD   300 mg at 10/28/18 0818  . hydrOXYzine (ATARAX/VISTARIL) tablet 25 mg  25 mg Oral TID PRN Money, Gerlene Burdockravis B, FNP   25 mg at 10/27/18 1159  . losartan (COZAAR) tablet 100 mg  100 mg Oral Daily Nainoa Woldt, Rockey SituFernando A, MD   100 mg at 10/28/18 0818  . magnesium hydroxide (MILK OF MAGNESIA) suspension 30 mL  30 mL Oral Daily PRN Dixon, Rashaun M, NP      . mirtazapine (REMERON SOL-TAB) disintegrating tablet 15 mg  15 mg Oral QHS,MR X 1 Kerry HoughSimon, Spencer E, PA-C   15 mg at 10/27/18 2239  . multivitamin with minerals tablet 1 tablet  1 tablet Oral Daily Amdrew Oboyle, Rockey SituFernando A, MD   1 tablet at 10/28/18 0818  . neomycin-bacitracin-polymyxin (NEOSPORIN) ointment   Topical PRN Money, Gerlene Burdockravis B, FNP   1 application at 10/28/18 [phone]  . thiamine (B-1) injection 100 mg  100 mg Intramuscular Once Xela Oregel A, MD      . thiamine (VITAMIN B-1) tablet 100 mg  100 mg Oral Daily Taunya Goral, Rockey SituFernando A, MD   100 mg at 10/28/18 0818  . Vilazodone HCl (VIIBRYD) TABS 20 mg  20 mg Oral Daily Caedmon Louque, Rockey SituFernando A, MD   20 mg at 10/28/18 0818    Lab Results:  Results for orders placed or performed during the hospital encounter of 10/25/18 (from the past 48 hour(s))  T3, free     Status: None   Collection Time: 10/27/18  6:43 AM  Result Value Ref Range   T3, Free 3.1 2.0 - 4.4 pg/mL    Comment: (NOTE) Performed At: [facility] [address] [place] [id number] Jolene SchimkeNagendra Sanjai MD [phone]   T4,  free     Status: None   Collection Time: 10/27/18  6:43 AM  Result Value Ref Range  Free T4 0.70 0.61 - 1.12 ng/dL    Comment: (NOTE) Biotin ingestion may interfere with free T4 tests. If the results are inconsistent with the TSH level, previous test results, or the clinical presentation, then consider biotin interference. If needed, order repeat testing after stopping biotin. Performed at [facility], [place] [address], [place], [place] [address]     Blood Alcohol level:  Lab Results  Component Value Date   ETH 375 [facility]) 10/24/2018   ETH 251 (H) 37/90/2409    Metabolic Disorder Labs: Lab Results  Component Value Date   HGBA1C 5.3 10/26/2018   MPG 105.41 10/26/2018   MPG 100 10/07/2015   No results found for: PROLACTIN Lab Results  Component Value Date   CHOL 177 10/20/2016   TRIG 147.0 10/20/2016   HDL 67.50 10/20/2016   CHOLHDL 3 10/20/2016   VLDL 29.4 10/20/2016   LDLCALC 80 10/20/2016   LDLCALC 115 (H) 10/29/2015    Physical Findings: AIMS: Facial and Oral Movements Muscles of Facial Expression: None, normal Lips and Perioral Area: None, normal Jaw: None, normal Tongue: None, normal,Extremity Movements Upper (arms, wrists, hands, fingers): None, normal Lower (legs, knees, ankles, toes): None, normal, Trunk Movements Neck, shoulders, hips: None, normal, Overall Severity Severity of abnormal movements (highest score from questions above): None, normal Incapacitation due to abnormal movements: None, normal Patient's awareness of abnormal movements (rate only patient's report): No Awareness, Dental Status Current problems with teeth and/or dentures?: No Does patient usually wear dentures?: No  CIWA:  CIWA-Ar Total: 1 COWS:     Musculoskeletal: Strength & Muscle Tone: within normal limits Gait & Station: normal Patient leans: N/A  Psychiatric Specialty Exam: Physical Exam  Nursing note and vitals reviewed. Constitutional: She is oriented to person,  place, and time. She appears well-developed and well-nourished.  Respiratory: Effort normal.  Musculoskeletal: Normal range of motion.  Neurological: She is alert and oriented to person, place, and time.    Review of Systems  Constitutional: Negative.   HENT: Negative.   Eyes: Negative.   Respiratory: Negative.   Cardiovascular: Negative.   Gastrointestinal: Negative.   Genitourinary: Negative.   Musculoskeletal: Negative.   Skin: Negative.   Neurological: Negative.   Endo/Heme/Allergies: Negative.   Psychiatric/Behavioral: Positive for substance abuse.    Blood pressure (!) 134/97, pulse 73, temperature 98.2 F (36.8 C), temperature source Oral, resp. rate 18, height 5\' 2"  (1.575 m), weight 65.3 kg, SpO2 100 %.Body mass index is 26.34 kg/m.  General Appearance: Casual  Eye Contact:  Good  Speech:  Clear and Coherent and Normal Rate  Volume:  Normal  Mood:  Euthymic  Affect:  Congruent  Thought Process:  Coherent and Descriptions of Associations: Intact  Orientation:  Full (Time, Place, and Person)  Thought Content:  WDL  Suicidal Thoughts:  No  Homicidal Thoughts:  No  Memory:  Immediate;   Good Recent;   Good Remote;   Good  Judgement:  Good  Insight:  Good  Psychomotor Activity:  Normal  Concentration:  Concentration: Good  Recall:  Good  Fund of Knowledge:  Good  Language:  Good  Akathisia:  No  Handed:  Right  AIMS (if indicated):     Assets:  Communication Skills Desire for Improvement Financial Resources/Insurance Housing Resilience Social Support Transportation  ADL's:  Intact  Cognition:  WNL  Sleep:  Number of Hours: 5.75   Problems addressed MDD severe recurrent without psychosis Alcohol dependence with alcohol induced mood disorder  Treatment Plan  Summary: Daily contact with patient to assess and evaluate symptoms and progress in treatment, Medication management and Plan is to: Continue Neurontin 300 mg p.o. twice daily for withdrawal  symptoms Continue Remeron 15 mg p.o. nightly for sleep and depression Continue Viibryd 20 mg p.o. daily for MDD Continue Librium as needed detox protocol Encourage group therapy participation Continue every 15 minute safety checks  Maryfrances Bunnellravis B Money, FNP 10/28/2018, 1:52 PM   Attest to NP Progress Note

## 2018-10-28 NOTE — Progress Notes (Signed)
Pt pleasant and appropriate on approach, no complaints voiced.  Pt was positive for evening wrap up group, observed engaged in appropriate interaction with peers on unit.  Pt stated that her TSH was elevated and wondered if the doctor was going to address this.  She would like that to be discussed with her tomorrow.  Pt denies SI/HI/AVH at this time.  A.  Support and encouragement offered, medication given as ordered  R.  Pt remains safe on the unit, will continue to monitor.

## 2018-10-28 NOTE — Plan of Care (Signed)
  Problem: Safety: Goal: Periods of time without injury will increase Outcome: Progressing   

## 2018-10-28 NOTE — Progress Notes (Signed)
D.  Pt pleasant on approach, no complaints voiced.  Pt observed engaged in appropriate interaction with peers on the unit.  Pt denies SI/HI/AVH at this time.  A.  Support and encouragement offered, medication given as ordered  R.  Pt remains safe on the unit, will continue to monitor.

## 2018-10-28 NOTE — [facility] Suicide Risk Assessment (Signed)
[facility] INPATIENT:  Family/Significant Other Suicide Prevention Education  Suicide Prevention Education:  Education Completed;  mother Sheilah Pigeon [phone],  (name of family member/significant other) has been identified by the patient as the family member/significant other with whom the patient will be residing, and identified as the person(s) who will aid the patient in the event of a mental health crisis (suicidal ideations/suicide attempt).  With written consent from the patient, the family member/significant other has been provided the following suicide prevention education, prior to the and/or following the discharge of the patient.  Mother Is concerned that patient has not followed up with psychiatric care in the past, only goes to primary care physicians who ask her what she thinks she needs as opposed to making suggestions.  Family is not sure that only going to [facility] is sufficient, feel she keeps "going in circles."  CSW advised the family to remove all alcoholic beverages and to not drink in front of her.   CSW spoke with family member for 55 minutes in suggesting various ways to handle patient's needs.  CSW also recommended to family that they start to attend Al-Anon meetings.  The suicide prevention education provided includes the following:  Suicide risk factors  Suicide prevention and interventions  National Suicide Hotline telephone number  [facility] assessment telephone number  [facility] and/or Residential Mobile Crisis Unit telephone number  Request made of family/significant other to:  Remove weapons (e.g., guns, rifles, knives), all items previously/currently identified as safety concern.    Remove drugs/medications (over-the-counter, prescriptions, illicit drugs), all items previously/currently identified as a safety concern.  The family member/significant other verbalizes understanding of the suicide  prevention education information provided.  The family member/significant other agrees to remove the items of safety concern listed above.  Berlin Hun Grossman-Orr 10/28/2018, 1:00 PM

## 2018-10-29 DIAGNOSIS — F332 Major depressive disorder, recurrent severe without psychotic features: Principal | ICD-10-CM

## 2018-10-29 MED ORDER — MIRTAZAPINE 15 MG PO TBDP: 15.0000 mg | ORAL_TABLET | Freq: Every day | ORAL | 0 refills | Status: DC

## 2018-10-29 MED ORDER — MIRTAZAPINE 15 MG PO TBDP: 15.0000 mg | ORAL_TABLET | Freq: Every evening | ORAL | 1 refills | Status: DC | PRN

## 2018-10-29 MED ORDER — VIIBRYD 10 MG PO TABS: 20.0000 mg | ORAL_TABLET | Freq: Every day | ORAL | 0 refills | Status: DC

## 2018-10-29 MED ORDER — GABAPENTIN 300 MG PO CAPS: 300.0000 mg | ORAL_CAPSULE | Freq: Two times a day (BID) | ORAL | 0 refills | Status: DC

## 2018-10-29 MED ORDER — VIIBRYD 20 MG PO TABS: ORAL_TABLET | ORAL | 1 refills | Status: DC

## 2018-10-29 NOTE — Progress Notes (Signed)
Discharge note: Patient reviewed discharge paperwork with RN including prescriptions, follow up appointments, and lab work. Patient given the opportunity to ask questions. All concerns were addressed. All belongings were returned to patient. Denied SI/HI/AVH. Patient thanked staff for their care while at the hospital.  Patient was discharged to lobby. 

## 2018-10-29 NOTE — [facility] Suicide Risk Assessment (Signed)
[facility] Discharge Suicide Risk Assessment   Principal Problem: Alcohol dependence and depressive symptoms Discharge Diagnoses: Active Problems:   MDD (major depressive disorder), recurrent severe, without psychosis ([place])   Alcohol dependence with alcohol-induced mood disorder ([place])   Total Time spent with patient: 45 minutes  Musculoskeletal: Strength & Muscle Tone: within normal limits Gait & Station: normalPsychiatric Specialty Exam: ROS  Blood pressure (!) 134/97, pulse 73, temperature 98.2 F (36.8 C), temperature source Oral, resp. rate 18, height 5\' 2"  (1.575 m), weight 65.3 kg, SpO2 100 %.Body mass index is 26.34 kg/m.  General Appearance: Casual  Eye Contact::  Good  Speech:  Clear and Coherent409  Volume:  Normal  Mood:  Euthymic  Affect:  Full Range  Thought Process:  Coherent and Descriptions of Associations: Intact  Orientation:  Full (Time, Place, and Person)  Thought Content:  Logical  Suicidal Thoughts:  No  Homicidal Thoughts:  No  Memory:  Immediate;   Good  Judgement:  Good  Insight:  Good  Psychomotor Activity:  Normal  Concentration:  Good  Recall:  Good  Fund of Knowledge:Good  Language: Good  Akathisia:  Negative  Handed:  Right  AIMS (if indicated):     Assets:  Communication Skills Desire for Improvement Intimacy Leisure Time Physical Health Resilience  Sleep:  Number of Hours: 6  Cognition: WNL  ADL's:  Intact   Mental Status Per Nursing Assessment::   On Admission:  Suicidal ideation indicated by patient, Plan includes specific time, place, or method, Intention to act on suicide plan, Suicidal ideation indicated by others, Self-harm thoughts, Belief that plan would result in death, Suicide plan, Self-harm behaviors  Demographic Factors:  NA  Loss Factors: NA  Historical Factors: NA  Risk Reduction Factors:   Sense of responsibility to family and Religious beliefs about death  Continued Clinical Symptoms:  stable Cognitive  Features That Contribute To Risk:  None    Suicide Risk:  Minimal: No identifiable suicidal ideation.  Patients presenting with no risk factors but with morbid ruminations; may be classified as minimal risk based on the severity of the depressive symptoms  [place] Follow up.   Specialty: Behavioral Health Why: [place] [facility] information: [place] [address] [phone]        Medicine, [facility] Family Follow up.   Specialty: Family Medicine Why: Medication management appointment with Cathlean Cower. Contact information: [place] [address] [phone]           Plan Of Care/Follow-up recommendations:  Activity:  full  Lamona Eimer, MD 10/29/2018, 10:27 AM

## 2018-10-29 NOTE — Tx Team (Signed)
Interdisciplinary Treatment and Diagnostic Plan Update  10/29/2018 Time of Session: 9:00am Aimee Thompson MRN: [id number]  Principal Diagnosis: <principal problem not specified>  Secondary Diagnoses: Active Problems:   MDD (major depressive disorder), recurrent severe, without psychosis (H[facility])   Alcohol dependence with alcohol-induced mood disorder (H[facility])   Current Medications:  Current Facility-Administered Medications  Medication Dose Route Frequency Provider Last Rate Last Dose  . acetaminophen (TYLENOL) tablet 650 mg  650 mg Oral Q6H PRN Jearld Leschixon, Rashaun M, NP   650 mg at 10/29/18 0748  . alum & mag hydroxide-simeth (MAALOX/MYLANTA) 200-200-20 MG/5ML suspension 30 mL  30 mL Oral Q4H PRN Dixon, Rashaun M, NP      . gabapentin (NEURONTIN) capsule 300 mg  300 mg Oral BID Cobos, Rockey SituFernando A, MD   300 mg at 10/29/18 0747  . hydrOXYzine (ATARAX/VISTARIL) tablet 25 mg  25 mg Oral TID PRN Money, Gerlene Burdockravis B, FNP   25 mg at 10/29/18 0749  . losartan (COZAAR) tablet 100 mg  100 mg Oral Daily Cobos, Rockey SituFernando A, MD   100 mg at 10/29/18 0748  . magnesium hydroxide (MILK OF MAGNESIA) suspension 30 mL  30 mL Oral Daily PRN Dixon, Rashaun M, NP      . mirtazapine (REMERON SOL-TAB) disintegrating tablet 15 mg  15 mg Oral QHS,MR X 1 Kerry HoughSimon, Spencer E, PA-C   15 mg at 10/28/18 2245  . multivitamin with minerals tablet 1 tablet  1 tablet Oral Daily Cobos, Rockey SituFernando A, MD   1 tablet at 10/29/18 0748  . neomycin-bacitracin-polymyxin (NEOSPORIN) ointment   Topical PRN Money, Gerlene Burdockravis B, FNP   1 application at 10/28/18 [phone]  . thiamine (B-1) injection 100 mg  100 mg Intramuscular Once Cobos, Fernando A, MD      . thiamine (VITAMIN B-1) tablet 100 mg  100 mg Oral Daily Cobos, Rockey SituFernando A, MD   100 mg at 10/29/18 0748  . Vilazodone HCl (VIIBRYD) TABS 20 mg  20 mg Oral Daily Cobos, Rockey SituFernando A, MD   20 mg at 10/29/18 0747   PTA Medications: Medications Prior to Admission  Medication Sig Dispense Refill Last Dose  .  albuterol (PROVENTIL HFA;VENTOLIN HFA) 108 (90 Base) MCG/ACT inhaler Inhale 2 puffs into the lungs every 6 (six) hours as needed for wheezing or shortness of breath. 1 Inhaler 5   . B Complex-C (SUPER B COMPLEX/VITAMIN C PO) Take 1 tablet by mouth daily.     . budesonide-formoterol (SYMBICORT) 160-4.5 MCG/ACT inhaler Inhale 2 puffs into the lungs 2 (two) times daily. For Shortness of breath (Patient taking differently: Inhale 2 puffs into the lungs 2 (two) times daily as needed (For shortness of breath.). ) 1 Inhaler 6   . cholecalciferol (VITAMIN D3) 25 MCG (1000 UT) tablet Take 1,000 Units by mouth daily.     . cholecalciferol (VITAMIN D3) 25 MCG (1000 UT) tablet Take 1,000 Units by mouth daily.     Marland Kitchen. escitalopram (LEXAPRO) 20 MG tablet TAKE 1 TABLET BY MOUTH ONCE DAILY (Patient taking differently: Take 20 mg by mouth daily. ) 90 tablet 3   . fenofibrate 160 MG tablet Take 1 tablet (160 mg total) by mouth daily. 90 tablet 3   . gabapentin (NEURONTIN) 300 MG capsule Take 300 mg by mouth 2 (two) times a day.     . losartan (COZAAR) 100 MG tablet Take 1 tablet (100 mg total) by mouth daily. 90 tablet 3   . magnesium oxide (MAG-OX) 400 MG tablet Take 400 mg by mouth  daily.     . Vilazodone HCl (VIIBRYD) 40 MG TABS Take 40 mg by mouth daily.     . vitamin B-12 (CYANOCOBALAMIN) 100 MCG tablet Take 100 mcg by mouth daily.       Patient Stressors: Financial difficulties Marital or family conflict Occupational concerns Substance abuse  Patient Strengths: Ability for insight Active sense of humor Average or above average intelligence Capable of independent living Communication skills General fund of knowledge Motivation for treatment/growth Physical Health Supportive family/friends Work skills  Treatment Modalities: Medication Management, Group therapy, Case management,  1 to 1 session with clinician, Psychoeducation, Recreational therapy.   Physician Treatment Plan for Primary Diagnosis:  <principal problem not specified> Long Term Goal(s): Improvement in symptoms so as ready for discharge Improvement in symptoms so as ready for discharge   Short Term Goals: Ability to identify changes in lifestyle to reduce recurrence of condition will improve Ability to verbalize feelings will improve Ability to disclose and discuss suicidal ideas Ability to demonstrate self-control will improve Ability to identify and develop effective coping behaviors will improve  Medication Management: Evaluate patient's response, side effects, and tolerance of medication regimen.  Therapeutic Interventions: 1 to 1 sessions, Unit Group sessions and Medication administration.  Evaluation of Outcomes: Adequate for Discharge  Physician Treatment Plan for Secondary Diagnosis: Active Problems:   MDD (major depressive disorder), recurrent severe, without psychosis ([place])   Alcohol dependence with alcohol-induced mood disorder ([place])  Long Term Goal(s): Improvement in symptoms so as ready for discharge Improvement in symptoms so as ready for discharge   Short Term Goals: Ability to identify changes in lifestyle to reduce recurrence of condition will improve Ability to verbalize feelings will improve Ability to disclose and discuss suicidal ideas Ability to demonstrate self-control will improve Ability to identify and develop effective coping behaviors will improve     Medication Management: Evaluate patient's response, side effects, and tolerance of medication regimen.  Therapeutic Interventions: 1 to 1 sessions, Unit Group sessions and Medication administration.  Evaluation of Outcomes: Adequate for Discharge   RN Treatment Plan for Primary Diagnosis: <principal problem not specified> Long Term Goal(s): Knowledge of disease and therapeutic regimen to maintain health will improve  Short Term Goals: Ability to verbalize feelings will improve, Ability to identify and develop effective coping  behaviors will improve and Compliance with prescribed medications will improve  Medication Management: RN will administer medications as ordered by provider, will assess and evaluate patient's response and provide education to patient for prescribed medication. RN will report any adverse and/or side effects to prescribing provider.  Therapeutic Interventions: 1 on 1 counseling sessions, Psychoeducation, Medication administration, Evaluate responses to treatment, Monitor vital signs and CBGs as ordered, Perform/monitor CIWA, COWS, AIMS and Fall Risk screenings as ordered, Perform wound care treatments as ordered.  Evaluation of Outcomes: Adequate for Discharge   LCSW Treatment Plan for Primary Diagnosis: <principal problem not specified> Long Term Goal(s): Safe transition to appropriate next level of care at discharge, Engage patient in therapeutic group addressing interpersonal concerns.  Short Term Goals: Engage patient in aftercare planning with referrals and resources, Increase social support, Increase emotional regulation, Identify triggers associated with mental health/substance abuse issues and Increase skills for wellness and recovery  Therapeutic Interventions: Assess for all discharge needs, 1 to 1 time with Social worker, Explore available resources and support systems, Assess for adequacy in community support network, Educate family and significant other(s) on suicide prevention, Complete Psychosocial Assessment, Interpersonal group therapy.  Evaluation of Outcomes: Adequate for  Discharge   Progress in Treatment: Attending groups: Yes. Participating in groups: Yes. Taking medication as prescribed: Yes. Toleration medication: Yes. Family/Significant other contact made: Yes, individual(s) contacted:  pts mother Aimee Thompson Patient understands diagnosis: Yes. Discussing patient identified problems/goals with staff: Yes. Medical problems stabilized or resolved: Yes. Denies  suicidal/homicidal ideation: Yes. Issues/concerns per patient self-inventory: Yes.  New problem(s) identified: Yes, Describe:  legal issues- DUI court date in October, separation from spouse, spouse has custody of the children  New Short Term/Long Term Goal(s): detox, medication management for mood stabilization; elimination of SI thoughts; development of comprehensive mental wellness/sobriety plan.  Patient Goals:  Get help for depression, anxiety, and ETOH abuse  Discharge Plan or Barriers: Pt reports she is set up to attend [facility] services at [facility]. CSW continuing to assess for appropriate referrals.  Reason for Continuation of Hospitalization: Today 10/29/2018  Estimated Length of Stay: Today 10/29/2018  Attendees: Patient:  10/29/2018 9:24 AM  Physician: Javier Dockerr.Cobos 10/29/2018 9:24 AM  Nursing: Chipper OmanAmanada, RN 10/29/2018 9:24 AM  RN Care Manager: 10/29/2018 9:24 AM  Social Worker: Aimee Pertlivia Pailynn Vahey, LCSW 10/29/2018 9:24 AM  Recreational Therapist:  10/29/2018 9:24 AM  Other:  10/29/2018 9:24 AM  Other:  10/29/2018 9:24 AM  Other: 10/29/2018 9:24 AM    Scribe for Treatment Team: Aimee Langelivia K Kedar Sedano, LCSW 10/29/2018 9:24 AM

## 2018-10-29 NOTE — Discharge Summary (Signed)
Physician Discharge Summary Note  Patient:  Aimee Thompson is an 48 y.o., female MRN:  [id number] DOB:  07/01/1970 Patient phone:  [phone] (home)  Patient address:   [address] [place] [address],  Total Time spent with patient: 15 minutes  Date of Admission:  10/25/2018 Date of Discharge: 10/29/18  Reason for Admission:  Alcohol dependence with suicidal ideation  Principal Problem: <principal problem not specified> Discharge Diagnoses: Active Problems:   MDD (major depressive disorder), recurrent severe, without psychosis (HCC)   Alcohol dependence with alcohol-induced mood disorder (HCC)   Past Psychiatric History: History of depression and alcohol use disorder. One [facility] for depression and alcohol use during her divorce. Denies history of manic or psychotic symptoms. Denies history of self-injurious behaviors or suicide attempts.  Past Medical History:  Past Medical History:  Diagnosis Date  . ADD (attention deficit disorder)   . ASTHMA   . EtOH dependence (HCC)   . HEART MURMUR, HX OF   . Hypertension   . Hypertriglyceridemia, familial     Past Surgical History:  Procedure Laterality Date  . CESAREAN SECTION  2011  . KNEE SURGERY  1990  . laproscopy    . SHOULDER SURGERY Right 2012   Family History:  Family History  Problem Relation Age of Onset  . Hypertension Other   . Colon cancer Neg Hx   . Esophageal cancer Neg Hx   . Pancreatic cancer Neg Hx   . Colitis Neg Hx   . Crohn's disease Neg Hx   . Kidney disease Neg Hx   . Liver disease Neg Hx   . Heart disease Neg Hx   . Stomach cancer Neg Hx    Family Psychiatric  History: She reports her parents "drink a decent amount daily." Social History:  Social History   Substance and Sexual Activity  Alcohol Use Yes  . Alcohol/week: 0.0 standard drinks   Comment: 1-2 bottles of wine/day     Social History   Substance and Sexual Activity  Drug Use No     Social History   Socioeconomic History  . Marital status: Single    Spouse name: Not on file  . Number of children: Not on file  . Years of education: Not on file  . Highest education level: Not on file  Occupational History  . Occupation: homemaker  Social Needs  . Financial resource strain: Not on file  . Food insecurity    Worry: Not on file    Inability: Not on file  . Transportation needs    Medical: Not on file    Non-medical: Not on file  Tobacco Use  . Smoking status: Never Smoker  . Smokeless tobacco: Never Used  Substance and Sexual Activity  . Alcohol use: Yes    Alcohol/week: 0.0 standard drinks    Comment: 1-2 bottles of wine/day  . Drug use: No  . Sexual activity: Yes    Birth control/protection: None  Lifestyle  . Physical activity    Days per week: Not on file    Minutes per session: Not on file  . Stress: Not on file  Relationships  . Social Musicianconnections    Talks on phone: Not on file    Gets together: Not on file    Attends religious service: Not on file    Active member of club or organization: Not on file    Attends meetings of clubs or organizations: Not on file  Relationship status: Not on file  Other Topics Concern  . Not on file  Social History Narrative  . Not on file    Hospital Course:  From admission H&P: Aimee Thompson is a 48 year old female with history of depression, alcohol use disorder, and HTN, presenting for treatment of alcohol dependence with suicidal ideation. She relapsed on alcohol in April 2020 after losing her job related to the pandemic. She reports drinking 1-2 bottles of wine per day. Her parents went to pick her up for dinner and found her intoxicated in her home, making suicidal statements. They brought her to the hospital. She does not recall the events prior to admission. She has superficial marks on her neck and states "I guess I must have tried to strangle myself." CT of head was done in ED with no acute findings. Her  live-in girlfriend left her two months ago related to the alcohol as well as stress with her children. She reports increased depression and drinking since that time but denies most symptoms of depression. She denies SI apart from alcohol use. She had a DUI two weeks ago. She denies HI/AVH. She reports current withdrawal symptoms- diaphoresis, diarrhea, nausea, headache, insomnia. She denies history of seizures or DTs during withdrawal. She denies drug use outside of alcohol. UDS negative. Admission BAL 375. She reports compliance with Viibryd 40 mg daily and Neurontin 300 mg BID and feels these medications have been beneficial.  Aimee Thompson was admitted after making suicidal statements and gestures while intoxicated with alcohol. She has history of alcohol use disorder with relapse in April 2020 after losing her job. She denied recent suicidal ideation outside of alcohol use. [facility]. CIWA protocol was started with Librium PRN CIWA>10 for alcohol withdrawal. Viibryd and Neurontin were continued. Remeron was started. She participated in group therapy on the unit. She responded well to treatment with no adverse effects reported. She has shown improved mood, affect, sleep, appetite, and interaction. She is future-oriented, expressing eagerness to return to her family. She states intent to continue sobriety from alcohol. She is discharging on the medications listed below. She denies any SI/HI/AVH and contracts for safety. She agrees to follow up at the [facility] (see below). Patient is provided with prescriptions for medications upon discharge. Her parents are picking her up for discharge home.  Physical Findings: AIMS: Facial and Oral Movements Muscles of Facial Expression: None, normal Lips and Perioral Area: None, normal Jaw: None, normal Tongue: None, normal,Extremity Movements Upper (arms, wrists, hands, fingers): None, normal Lower (legs,  knees, ankles, toes): None, normal, Trunk Movements Neck, shoulders, hips: None, normal, Overall Severity Severity of abnormal movements (highest score from questions above): None, normal Incapacitation due to abnormal movements: None, normal Patient's awareness of abnormal movements (rate only patient's report): No Awareness, Dental Status Current problems with teeth and/or dentures?: No Does patient usually wear dentures?: No  CIWA:  CIWA-Ar Total: 1 COWS:     Musculoskeletal: Strength & Muscle Tone: within normal limits Gait & Station: normal Patient leans: N/A  Psychiatric Specialty Exam: Physical Exam  Nursing note and vitals reviewed. Constitutional: She is oriented to person, place, and time. She appears well-developed and well-nourished.  Cardiovascular: Normal rate.  Respiratory: Effort normal.  Neurological: She is alert and oriented to person, place, and time.    Review of Systems  Constitutional: Negative.   Gastrointestinal: Negative for diarrhea, nausea and vomiting.  Neurological: Negative for tremors, sensory  change and headaches.  Psychiatric/Behavioral: Positive for depression (stable on medication) and substance abuse (ETOH). Negative for hallucinations and suicidal ideas. The patient is not nervous/anxious and does not have insomnia.     Blood pressure (!) 134/97, pulse 73, temperature 98.2 F (36.8 C), temperature source Oral, resp. rate 18, height 5\' 2"  (1.575 m), weight 65.3 kg, SpO2 100 %.Body mass index is 26.34 kg/m.  See MD's discharge SRA      Has this patient used any form of tobacco in the last 30 days? (Cigarettes, Smokeless Tobacco, Cigars, and/or Pipes)  No  Blood Alcohol level:  Lab Results  Component Value Date   ETH 375 (HH) 10/24/2018   ETH 251 (H) 83/38/2505    Metabolic Disorder Labs:  Lab Results  Component Value Date   HGBA1C 5.3 10/26/2018   MPG 105.41 10/26/2018   MPG 100 10/07/2015   No results found for: PROLACTIN Lab  Results  Component Value Date   CHOL 177 10/20/2016   TRIG 147.0 10/20/2016   HDL 67.50 10/20/2016   CHOLHDL 3 10/20/2016   VLDL 29.4 10/20/2016   LDLCALC 80 10/20/2016   LDLCALC 115 (H) 10/29/2015    See Psychiatric Specialty Exam and Suicide Risk Assessment completed by Attending Physician prior to discharge.  Discharge destination:  Home  Is patient on multiple antipsychotic therapies at discharge:  No   Has Patient had three or more failed trials of antipsychotic monotherapy by history:  No  Recommended Plan for Multiple Antipsychotic Therapies: NA   Allergies as of 10/29/2018      Reactions   Penicillins Hives   Has patient had a PCN reaction causing immediate rash, facial/tongue/throat swelling, SOB or lightheadedness with hypotension: no Has patient had a PCN reaction causing severe rash involving mucus membranes or skin necrosis: no Has patient had a PCN reaction that required hospitalization no Has patient had a PCN reaction occurring within the last 10 years: yes If all of the above answers are "NO", then may proceed with Cephalosporin use.   Scallops [shellfish Allergy] Itching, Other (See Comments)   Causes redness      Medication List    STOP taking these medications   escitalopram 20 MG tablet Commonly known as: LEXAPRO     TAKE these medications     Indication  albuterol 108 (90 Base) MCG/ACT inhaler Commonly known as: VENTOLIN HFA Inhale 2 puffs into the lungs every 6 (six) hours as needed for wheezing or shortness of breath.  Indication: Asthma, Reversible Disease of Blockage in Breathing Passages   budesonide-formoterol 160-4.5 MCG/ACT inhaler Commonly known as: SYMBICORT Inhale 2 puffs into the lungs 2 (two) times daily. For Shortness of breath What changed:   when to take this  reasons to take this  additional instructions  Indication: Asthma, Shortness of breath   cholecalciferol 25 MCG (1000 UT) tablet Commonly known as: VITAMIN  D3 Take 1,000 Units by mouth daily.  Indication: 21-Hydroxylase Deficiency   cholecalciferol 25 MCG (1000 UT) tablet Commonly known as: VITAMIN D3 Take 1,000 Units by mouth daily.  Indication: 21-Hydroxylase Deficiency   fenofibrate 160 MG tablet Take 1 tablet (160 mg total) by mouth daily.  Indication: High Amount of Triglycerides in the Blood   gabapentin 300 MG capsule Commonly known as: NEURONTIN Take 1 capsule (300 mg total) by mouth 2 (two) times daily. What changed: when to take this  Indication: Abuse or Misuse of Alcohol   losartan 100 MG tablet Commonly known as: COZAAR Take 1  tablet (100 mg total) by mouth daily.  Indication: High Blood Pressure Disorder   magnesium oxide 400 MG tablet Commonly known as: MAG-OX Take 400 mg by mouth daily.  Indication: Disorder with Low Magnesium Levels   mirtazapine 15 MG disintegrating tablet Commonly known as: REMERON SOL-TAB Take 1 tablet (15 mg total) by mouth at bedtime.  Indication: Major Depressive Disorder   SUPER B COMPLEX/VITAMIN C PO Take 1 tablet by mouth daily.  Indication: 21-Hydroxylase Deficiency   Viibryd 10 MG Tabs Generic drug: Vilazodone HCl Take 2 tablets (20 mg total) by mouth daily. Start taking on: October 30, 2018 What changed:   medication strength  how much to take  Indication: Major Depressive Disorder   vitamin B-12 100 MCG tablet Commonly known as: CYANOCOBALAMIN Take 100 mcg by mouth daily.  Indication: Pernicious Anemia      Follow-up Information    [facility] Follow up.   Specialty: Behavioral Health Why: Lawyerocial Worker Assistant attempted to contact office several times. Please call and schedule an [facility].  Contact information: [address] [place] [address] [phone]        Medicine, [facility] Follow up on 11/06/2018.   Specialty: Family Medicine Why: Medication management appointment with  Ladonna SnideBrandon, P.A. is Tuesday, 8/18 at 2:45p.  Please bring your current medications and discharge paperwork from this hospitalization.  Contact information: [address] [place] [address] [phone]           Follow-up recommendations: Activity as tolerated. Diet as recommended by primary care physician. Keep all scheduled follow-up appointments as recommended.   Comments:   Patient is instructed to take all prescribed medications as recommended. Report any side effects or adverse reactions to your outpatient psychiatrist. Patient is instructed to abstain from alcohol and illegal drugs while on prescription medications. In the event of worsening symptoms, patient is instructed to call the crisis hotline, 911, or go to the nearest emergency department for evaluation and treatment.  Signed: Aldean BakerJanet E Karandeep Resende, NP 10/29/2018, 1:59 PM

## 2018-10-29 NOTE — Progress Notes (Signed)
  [facility] Adult Case Management Discharge Plan :  Will you be returning to the same living situation after discharge:  Yes,  home with parents and then back to her home At discharge, do you have transportation home?: Yes,  pt's parents Do you have the ability to pay for your medications: Yes,  private insurance  Release of information consent forms completed and in the chart;  Patient's signature needed at discharge.  Patient to Follow up at: [facility] Follow up.   Specialty: Behavioral Health Why: TEFL teacher attempted to contact office several times. Please call and schedule an intake appointment for the [facility] with agency.  Contact information: [place] [address] [phone]        Medicine, [facility] Follow up on 11/06/2018.   Specialty: Family Medicine Why: Medication management appointment with Cathlean Cower. is Tuesday, 8/18 at 2:45p.  Please bring your current medications and discharge paperwork from this hospitalization.  Contact information: [place] [address] [phone]           Next level of care provider has access to [place] and Suicide Prevention discussed: Yes,  pt's mother     Has patient been referred to the Quitline?: N/A patient is not a smoker  Patient has been referred for addiction treatment: Yes  Trecia Rogers, LCSW 10/29/2018, 11:17 AM

## 2019-02-28 ENCOUNTER — Emergency Department (HOSPITAL_COMMUNITY)
Admission: EM | Admit: 2019-02-28 | Discharge: 2019-02-28 | Discharge status: Against Medical Advice | Payer: BLUE CROSS/BLUE SHIELD | Attending: Emergency Medicine | Admitting: Emergency Medicine

## 2019-02-28 ENCOUNTER — Other Ambulatory Visit: Payer: Self-pay

## 2019-02-28 DIAGNOSIS — Z0441 Encounter for examination and observation following alleged adult rape: Secondary | ICD-10-CM | POA: Diagnosis present

## 2019-02-28 DIAGNOSIS — Z5321 Procedure and treatment not carried out due to patient leaving prior to being seen by health care provider: Secondary | ICD-10-CM | POA: Insufficient documentation

## 2019-02-28 NOTE — ED Notes (Signed)
Patient was seen fast walking through the emergency department. This RN stopped the patient and asked where she was going. Patient stated "I have waited long enough and I am going to be in the comfort of my home". Patient refused to sign AMA and was ambulatory upon exit. No obvious signs of distress.
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
## 2020-04-27 ENCOUNTER — Emergency Department (HOSPITAL_COMMUNITY): Payer: BC Managed Care – PPO

## 2020-04-27 ENCOUNTER — Encounter (HOSPITAL_COMMUNITY): Payer: Self-pay | Admitting: Emergency Medicine

## 2020-04-27 ENCOUNTER — Other Ambulatory Visit: Payer: Self-pay

## 2020-04-27 ENCOUNTER — Emergency Department (HOSPITAL_COMMUNITY)
Admission: EM | Admit: 2020-04-27 | Discharge: 2020-04-27 | Disposition: A | Discharge status: Home / Self Care | Payer: BC Managed Care – PPO | Attending: Emergency Medicine | Admitting: Emergency Medicine

## 2020-04-27 DIAGNOSIS — R6884 Jaw pain: Secondary | ICD-10-CM | POA: Insufficient documentation

## 2020-04-27 DIAGNOSIS — R079 Chest pain, unspecified: Secondary | ICD-10-CM | POA: Insufficient documentation

## 2020-04-27 DIAGNOSIS — Z5321 Procedure and treatment not carried out due to patient leaving prior to being seen by health care provider: Secondary | ICD-10-CM | POA: Diagnosis not present

## 2020-04-27 LAB — BASIC METABOLIC PANEL
Anion gap: 14 (ref 5–15)
BUN: 11 mg/dL (ref 6–20)
CO2: 23 mmol/L (ref 22–32)
Calcium: 8.5 mg/dL — ABNORMAL LOW (ref 8.9–10.3)
Chloride: 101 mmol/L (ref 98–111)
Creatinine, Ser: 0.71 mg/dL (ref 0.44–1.00)
GFR, Estimated: >60 mL/min (ref >60)
Glucose, Bld: 112 mg/dL — ABNORMAL HIGH (ref 70–99)
Potassium: 3.5 mmol/L (ref 3.5–5.1)
Sodium: 138 mmol/L (ref 135–145)

## 2020-04-27 LAB — CBC
HCT: 43.4 % (ref 36.0–46.0)
Hemoglobin: 14.3 g/dL (ref 12.0–15.0)
MCH: 31.4 pg (ref 26.0–34.0)
MCHC: 32.9 g/dL (ref 30.0–36.0)
MCV: 95.4 fL (ref 80.0–100.0)
Platelets: 278 10*3/uL (ref 150–400)
RBC: 4.55 MIL/uL (ref 3.87–5.11)
RDW: 13.6 % (ref 11.5–15.5)
WBC: 6 10*3/uL (ref 4.0–10.5)
nRBC: 0 % (ref 0.0–0.2)

## 2020-04-27 LAB — I-STAT BETA HCG BLOOD, ED (MC, WL, AP ONLY): I-stat hCG, quantitative: <5 m[IU]/mL (ref <5)

## 2020-04-27 LAB — TROPONIN I (HIGH SENSITIVITY): Troponin I (High Sensitivity): 3 ng/L (ref <18)

## 2020-04-27 NOTE — ED Triage Notes (Signed)
Pt arrives via [facility], c/o chest pain since 9am this morning. Received 324mg  asa pta, pt does endorse radiation of pain to L jaw, however, pain is also reproducable with palpation. 145/88, hr 80s, o2100% on room air. Supposed to be on BP meds but states she cannot afford it. Family hx of cardiac events, no personal hx.

## 2020-04-27 NOTE — ED Notes (Signed)
Pt called several times for vital recheck w/o response.

## 2020-05-19 ENCOUNTER — Other Ambulatory Visit: Payer: Self-pay

## 2020-05-19 ENCOUNTER — Encounter (HOSPITAL_COMMUNITY): Payer: Self-pay | Admitting: Emergency Medicine

## 2020-05-19 ENCOUNTER — Encounter (HOSPITAL_COMMUNITY): Payer: Self-pay | Admitting: Registered Nurse

## 2020-05-19 ENCOUNTER — Ambulatory Visit (HOSPITAL_COMMUNITY)
Admission: EM | Admit: 2020-05-19 | Discharge: 2020-05-19 | Disposition: A | Discharge status: Other Acute Inpatient Hospital | Payer: BC Managed Care – PPO | Attending: Registered Nurse | Admitting: Registered Nurse

## 2020-05-19 ENCOUNTER — Emergency Department (HOSPITAL_COMMUNITY): Payer: BC Managed Care – PPO

## 2020-05-19 ENCOUNTER — Emergency Department (HOSPITAL_COMMUNITY)
Admission: EM | Admit: 2020-05-19 | Discharge: 2020-05-19 | Disposition: A | Discharge status: Home / Self Care | Payer: BC Managed Care – PPO | Attending: Emergency Medicine | Admitting: Emergency Medicine

## 2020-05-19 DIAGNOSIS — F419 Anxiety disorder, unspecified: Secondary | ICD-10-CM | POA: Diagnosis not present

## 2020-05-19 DIAGNOSIS — F10230 Alcohol dependence with withdrawal, uncomplicated: Secondary | ICD-10-CM | POA: Diagnosis not present

## 2020-05-19 DIAGNOSIS — Z79899 Other long term (current) drug therapy: Secondary | ICD-10-CM | POA: Insufficient documentation

## 2020-05-19 DIAGNOSIS — F1024 Alcohol dependence with alcohol-induced mood disorder: Secondary | ICD-10-CM | POA: Diagnosis present

## 2020-05-19 DIAGNOSIS — J45909 Unspecified asthma, uncomplicated: Secondary | ICD-10-CM | POA: Diagnosis not present

## 2020-05-19 DIAGNOSIS — R079 Chest pain, unspecified: Secondary | ICD-10-CM | POA: Insufficient documentation

## 2020-05-19 DIAGNOSIS — F1020 Alcohol dependence, uncomplicated: Secondary | ICD-10-CM | POA: Diagnosis present

## 2020-05-19 DIAGNOSIS — F332 Major depressive disorder, recurrent severe without psychotic features: Secondary | ICD-10-CM | POA: Diagnosis not present

## 2020-05-19 DIAGNOSIS — I10 Essential (primary) hypertension: Secondary | ICD-10-CM | POA: Diagnosis not present

## 2020-05-19 DIAGNOSIS — R0602 Shortness of breath: Secondary | ICD-10-CM | POA: Insufficient documentation

## 2020-05-19 DIAGNOSIS — F10930 Alcohol use, unspecified with withdrawal, uncomplicated: Secondary | ICD-10-CM

## 2020-05-19 LAB — ETHANOL: Alcohol, Ethyl (B): 323 mg/dL (ref <10)

## 2020-05-19 LAB — CBC
HCT: 41 % (ref 36.0–46.0)
Hemoglobin: 14.2 g/dL (ref 12.0–15.0)
MCH: 33.4 pg (ref 26.0–34.0)
MCHC: 34.6 g/dL (ref 30.0–36.0)
MCV: 96.5 fL (ref 80.0–100.0)
Platelets: 304 10*3/uL (ref 150–400)
RBC: 4.25 MIL/uL (ref 3.87–5.11)
RDW: 14.1 % (ref 11.5–15.5)
WBC: 5.3 10*3/uL (ref 4.0–10.5)
nRBC: 0 % (ref 0.0–0.2)

## 2020-05-19 LAB — COMPREHENSIVE METABOLIC PANEL
ALT: 34 U/L (ref 0–44)
AST: 47 U/L — ABNORMAL HIGH (ref 15–41)
Albumin: 3.8 g/dL (ref 3.5–5.0)
Alkaline Phosphatase: 61 U/L (ref 38–126)
Anion gap: 13 (ref 5–15)
BUN: 11 mg/dL (ref 6–20)
CO2: 24 mmol/L (ref 22–32)
Calcium: 8.6 mg/dL — ABNORMAL LOW (ref 8.9–10.3)
Chloride: 105 mmol/L (ref 98–111)
Creatinine, Ser: 0.85 mg/dL (ref 0.44–1.00)
GFR, Estimated: >60 mL/min (ref >60)
Glucose, Bld: 114 mg/dL — ABNORMAL HIGH (ref 70–99)
Potassium: 4.3 mmol/L (ref 3.5–5.1)
Sodium: 142 mmol/L (ref 135–145)
Total Bilirubin: 0.4 mg/dL (ref 0.3–1.2)
Total Protein: 6.9 g/dL (ref 6.5–8.1)

## 2020-05-19 LAB — RAPID URINE DRUG SCREEN, HOSP PERFORMED
Amphetamines: NOT DETECTED
Barbiturates: NOT DETECTED
Benzodiazepines: NOT DETECTED
Cocaine: NOT DETECTED
Opiates: NOT DETECTED
Tetrahydrocannabinol: NOT DETECTED

## 2020-05-19 LAB — SALICYLATE LEVEL: Salicylate Lvl: <7 mg/dL — ABNORMAL LOW (ref 7.0–30.0)

## 2020-05-19 LAB — TROPONIN I (HIGH SENSITIVITY): Troponin I (High Sensitivity): 3 ng/L (ref <18)

## 2020-05-19 LAB — ACETAMINOPHEN LEVEL: Acetaminophen (Tylenol), Serum: <10 ug/mL — ABNORMAL LOW (ref 10–30)

## 2020-05-19 LAB — I-STAT BETA HCG BLOOD, ED (MC, WL, AP ONLY): I-stat hCG, quantitative: <5 m[IU]/mL (ref <5)

## 2020-05-19 NOTE — ED Triage Notes (Addendum)
Patient here from [facility], having two days of chest pain, no shortness of breath, no nausea or vomiting, no diaphoresis.  Patient was seen at [facility] for SI, she has some increased stress in her life.  She does not endorse SI, but is feeling sad today.
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
## 2020-05-19 NOTE — ED Notes (Signed)
extensive DC instructions given to patient on s/s of withdrawal and resources. Patient verbalized understanding and refusing vital signs at this time. Parents pulling up to the front circle to pick her up. Patient belongings returned.

## 2020-05-19 NOTE — ED Provider Notes (Signed)
Behavioral Health Urgent Care Medical Screening Exam  Patient Name: Aimee Thompson MRN: [id number] Date of Evaluation: 05/19/20 Chief Complaint:   Diagnosis:  Final diagnoses:  None    History of Present illness: Aimee Thompson is a 50 y.o. female patient presented to [facility] as a walk in accompanied by her parents with complaints of worsening depression and requesting alcohol detox  Phillip Heal, 50 y.o., female patient seen face to face by this provider, consulted with Dr. Nelly Rout; and chart reviewed on 05/19/20.  On evaluation Aimee Thompson reports she has a chronic history of alcohol abuse.  States she has been drinking everyday for the last couple of days and today she was feeling helpless and called parents and was brought here.  Patient states she got a DUI in June 2021 and court date is coming up next week.  States she wants to be admitted to hospital for detox.  States she had been in hospital before for same thing.  Patient states she was inpatient at [facility] about 3 months ago.  Patient denies that she is suicidal/homicidal, psychotic, or having paranoia.  "Just send me to [facility] for detox.  I just need to go to the hospital.  Patient is also complaining of chest pain and shortness of breath.  Reports that she has family history of heart disorders.  States she has been taking her medications for hypertension.    During evaluation Aimee Thompson is sitting up right in chair.  She does not appear to be in any acute distress;but face is flushed.  She is alert, oriented x 4, calm and cooperative some irritability with question and wanting to go to [facility] and not ED.  Her mood is anxious, irritable congruent affect.  She does not appear to be responding to internal/external stimuli or delusional thoughts.  Patient denies suicidal/self-harm/homicidal ideation, psychosis, and paranoia.  Patient answered question appropriately.       Psychiatric Specialty  Exam  Presentation  General Appearance:Appropriate for Environment; Casual  Eye Contact:Good  Speech:Clear and Coherent; Normal Rate  Speech Volume:Normal  Handedness:Right   Mood and Affect  Mood:Anxious  Affect:Congruent   Thought Process  Thought Processes:Coherent; Goal Directed  Descriptions of Associations:Intact  Orientation:Full (Time, Place and Person)  Thought Content:WDL  Hallucinations:None  Ideas of Reference:None  Suicidal Thoughts:No  Homicidal Thoughts:No   Sensorium  Memory:Immediate Good; Recent Good  Judgment:Intact  Insight:Present   Executive Functions  Concentration:Good  Attention Span:Good  Recall:Good  Fund of Knowledge:Good  Language:Good   Psychomotor Activity  Psychomotor Activity:Normal   Assets  Assets:Communication Skills; Desire for Improvement; Financial Resources/Insurance; Housing; Resilience; Social Support   Sleep  Sleep:Good  Number of hours: No data recorded  Nutritional Assessment (For OBS and FBC admissions only) Has the patient had a weight loss or gain of 10 pounds or more in the last 3 months?: No Has the patient had a decrease in food intake/or appetite?: No Does the patient have dental problems?: No Does the patient have eating habits or behaviors that may be indicators of an eating disorder including binging or inducing vomiting?: No Has the patient recently lost weight without trying?: No Has the patient been eating poorly because of a decreased appetite?: No Malnutrition Screening Tool Score: 0   Physical Exam: Physical Exam Vitals and nursing note reviewed.  Constitutional:      General: She is not in acute distress.    Appearance: She is obese. She is diaphoretic.  Comments: Possible intoxication   Cardiovascular:     Rate and Rhythm: Normal rate.  Pulmonary:     Effort: Pulmonary effort is normal.  Musculoskeletal:        General: Normal range of motion.  Skin:     General: Skin is warm.  Neurological:     Mental Status: She is alert and oriented to person, place, and time.  Psychiatric:        Attention and Perception: Attention and perception normal. She does not perceive auditory or visual hallucinations.        Mood and Affect: Affect normal. Mood is anxious.        Speech: Speech normal.        Behavior: Behavior is cooperative.        Thought Content: Thought content is not paranoid or delusional. Thought content does not include homicidal or suicidal ideation.        Cognition and Memory: Cognition and memory normal.        Judgment: Judgment is impulsive.    Review of Systems  Constitutional: Negative.   HENT: Negative.   Eyes: Negative.   Respiratory: Positive for shortness of breath. Negative for cough.   Cardiovascular: Positive for chest pain.       History of hypertension.  Stats she has been taking her medications as order.  Patient complaining of shortness of breath and chest pain.  Points to center of chest where pain is located; states pain feels heavy, pressure.  Patient states that her family has a pretty significant history of cardiac issues.      Gastrointestinal: Negative.   Genitourinary: Negative.   Musculoskeletal: Negative.   Skin: Negative.   Neurological: Negative.  Negative for dizziness, seizures and loss of consciousness.  Endo/Heme/Allergies: Negative.   Psychiatric/Behavioral: Negative for memory loss. Depression: Stable. Hallucinations: Denies. Suicidal ideas: Denies. The patient does not have insomnia. Nervous/anxious: Stable.    Blood pressure 137/90, pulse 77, resp. rate 20, SpO2 97 %. There is no height or weight on file to calculate BMI.  Musculoskeletal: Strength & Muscle Tone: within normal limits Gait & Station: normal Patient leans: N/A   [facility] MSE Discharge Disposition for Follow up and Recommendations: Based on my evaluation the patient appears to have an emergency medical condition for which I  recommend the patient be transferred to the emergency department for further evaluation.   Patient has been psychiatric cleared; and will transport to [facility] ED for further evaluation of chest pain    Follow-up Information    Call  [facility]   Specialty: Addiction Medicine Why: Please call to determine bed availability on a DAILY basis. Be sure to have any discharge paperwork available once you contact facility. Contact information: [address] [place] [address] [phone]        [facility]   Specialty: Behavioral Health Why: Call to schedule appointment for CD IOP program Contact information: [address] [place] [address] [phone]              Disposition:  Psychiatrically cleared No evidence of imminent risk to self or others at present.   Patient does not meet criteria for psychiatric inpatient admission. Supportive therapy provided about ongoing stressors. Refer to IOP. Discussed crisis plan, support from social network, calling 911, coming to the Emergency Department, and calling Suicide Hotline.   Spoke to Dr. Lynelle Doctor via telephone informed that would be sending patient  to [facility] ED for further of complaints of chest pain and shortness of breath.  Secure message sent stating: This is a patient that is being sent from [facility] to ED with complaints of chest pain and shortness of breath. Patient has been psychiatrically cleared and discharged so once medically cleared in ED can discharge home. Resources were also given prior to her discharge from [facility] Lopez Dentinger, NP 05/19/2020, 6:49 PM

## 2020-05-19 NOTE — ED Provider Notes (Signed)
[facility] EMERGENCY DEPARTMENT Provider Note   CSN: [id number] Arrival date & time: 05/19/20  1955     History Chief Complaint  Patient presents with  . Chest Pain    Aimee Thompson is a 50 y.o. female.  Patient presents to the ED with a chief complaint of anxiety.  She states that she has been having associated chest pain and shortness of breath.  She was seen at [facility] earlier and cleared by psychiatry with recommendations for outpatient counseling, but was sent to the ED for evaluation of chest pain.  She states that her chest pain symptoms are due to her anxiety.  She denies any SI or HI to me.    The history is provided by the patient. No language interpreter was used.       Past Medical History:  Diagnosis Date  . ADD (attention deficit disorder)   . ASTHMA   . EtOH dependence (HCC)   . HEART MURMUR, HX OF   . Hypertension   . Hypertriglyceridemia, familial     Patient Active Problem List   Diagnosis Date Noted  . Alcohol use disorder, severe, dependence (HCC) 05/19/2020  . Chest pain 05/19/2020  . MDD (major depressive disorder), recurrent severe, without psychosis (HCC) 10/25/2018  . Alcohol dependence with alcohol-induced mood disorder (HCC)   . Alcohol use 11/21/2017  . Hyperglycemia 10/20/2016  . Acute pancreatic necrosis   . Distended abdomen   . Hypertriglyceridemia 10/01/2015  . Acute pancreatitis 09/30/2015  . Abdominal pain 03/26/2015  . Hematochezia 03/26/2015  . Right hand pain 07/01/2014  . Acute sinus infection 01/24/2013  . Other malaise and fatigue 10/31/2012  . Night sweats 10/31/2012  . Essential hypertension, benign 10/31/2012  . Anxiety and depression 07/11/2011  . Vertigo 08/09/2010  . ADD (attention deficit disorder) 08/09/2010  . Preventative health care 08/06/2010  . Asthma 06/01/2010  . WEIGHT LOSS 06/03/2009  . HEART MURMUR, HX OF 11/03/2006    Past Surgical History:  Procedure Laterality Date  .  CESAREAN SECTION  2011  . KNEE SURGERY  1990  . laproscopy    . SHOULDER SURGERY Right 2012     OB History   No obstetric history on file.     Family History  Problem Relation Age of Onset  . Hypertension Other   . Colon cancer Neg Hx   . Esophageal cancer Neg Hx   . Pancreatic cancer Neg Hx   . Colitis Neg Hx   . Crohn's disease Neg Hx   . Kidney disease Neg Hx   . Liver disease Neg Hx   . Heart disease Neg Hx   . Stomach cancer Neg Hx     Social History   Tobacco Use  . Smoking status: Never Smoker  . Smokeless tobacco: Never Used  Vaping Use  . Vaping Use: Never used  Substance Use Topics  . Alcohol use: Yes    Alcohol/week: 0.0 standard drinks    Comment: 1-2 bottles of wine/day  . Drug use: No    Home Medications Prior to Admission medications   Medication Sig Start Date End Date Taking? Authorizing Provider  albuterol (PROVENTIL HFA;VENTOLIN HFA) 108 (90 Base) MCG/ACT inhaler Inhale 2 puffs into the lungs every 6 (six) hours as needed for wheezing or shortness of breath. 03/26/15   Corwin Levins, MD  B Complex-C (SUPER B COMPLEX/VITAMIN C PO) Take 1 tablet by mouth daily.    [provider]  budesonide-formoterol (SYMBICORT) 160-4.5  MCG/ACT inhaler Inhale 2 puffs into the lungs 2 (two) times daily. For Shortness of breath Patient taking differently: Inhale 2 puffs into the lungs 2 (two) times daily as needed (For shortness of breath.).  06/04/13   Corwin Levins, MD  chlordiazePOXIDE (LIBRIUM) 10 MG capsule Take 10 mg by mouth 3 (three) times daily as needed for anxiety.    [provider]  cholecalciferol (VITAMIN D3) 25 MCG (1000 UT) tablet Take 1,000 Units by mouth daily.    [provider]  fenofibrate 160 MG tablet Take 1 tablet (160 mg total) by mouth daily. Patient not taking: Reported on 02/28/2019 12/16/15   Corwin Levins, MD  FLUoxetine (PROZAC) 40 MG capsule Take 40 mg by mouth daily. 01/01/19   [provider]   gabapentin (NEURONTIN) 300 MG capsule Take 1 capsule (300 mg total) by mouth 2 (two) times daily. 10/29/18   Aldean Baker, NP  hydroxypropyl methylcellulose / hypromellose (ISOPTO TEARS / GONIOVISC) 2.5 % ophthalmic solution Place 1 drop into both eyes 3 (three) times daily as needed for dry eyes.    [provider]  losartan (COZAAR) 100 MG tablet Take 1 tablet (100 mg total) by mouth daily. 11/21/17   Corwin Levins, MD  lurasidone (LATUDA) 20 MG TABS tablet Take 20 mg by mouth daily.    [provider]  mirtazapine (REMERON SOL-TAB) 15 MG disintegrating tablet Take 1 tablet (15 mg total) by mouth at bedtime. Patient not taking: Reported on 02/28/2019 10/29/18   Aldean Baker, NP  naltrexone (DEPADE) 50 MG tablet Take 50 mg by mouth daily. 12/31/18   [provider]  Vilazodone HCl (VIIBRYD) 10 MG TABS Take 2 tablets (20 mg total) by mouth daily. Patient not taking: Reported on 02/28/2019 10/30/18   Aldean Baker, NP  vitamin B-12 (CYANOCOBALAMIN) 100 MCG tablet Take 100 mcg by mouth daily.    [provider]  zaleplon (SONATA) 10 MG capsule Take 10 mg by mouth at bedtime as needed for sleep.    [provider]    Allergies    Penicillins and Scallops [shellfish allergy]  Review of Systems   Review of Systems  All other systems reviewed and are negative.   Physical Exam Updated Vital Signs BP 130/89 (BP Location: Right Arm)   Pulse 69   Temp 98.9 F (37.2 C) (Oral)   Resp 16   SpO2 97%   Physical Exam Vitals and nursing note reviewed.  Constitutional:      General: She is not in acute distress.    Appearance: She is well-developed and well-nourished.  HENT:     Head: Normocephalic and atraumatic.  Eyes:     Conjunctiva/sclera: Conjunctivae normal.  Cardiovascular:     Rate and Rhythm: Normal rate and regular rhythm.     Heart sounds: No murmur heard.   Pulmonary:     Effort: Pulmonary effort is normal. No respiratory  distress.     Breath sounds: Normal breath sounds.  Abdominal:     Palpations: Abdomen is soft.     Tenderness: There is no abdominal tenderness.  Musculoskeletal:        General: No edema. Normal range of motion.     Cervical back: Neck supple.  Skin:    General: Skin is warm and dry.  Neurological:     Mental Status: She is alert and oriented to person, place, and time.  Psychiatric:        Mood and  Affect: Mood and affect and mood normal.     Comments: anxious     ED Results / Procedures / Treatments   Labs (all labs ordered are listed, but only abnormal results are displayed) Labs Reviewed  COMPREHENSIVE METABOLIC PANEL - Abnormal; Notable for the following components:      Result Value   Glucose, Bld 114 (*)    Calcium 8.6 (*)    AST 47 (*)    All other components within normal limits  ETHANOL - Abnormal; Notable for the following components:   Alcohol, Ethyl (B) 323 (*)    All other components within normal limits  SALICYLATE LEVEL - Abnormal; Notable for the following components:   Salicylate Lvl <7.0 (*)    All other components within normal limits  ACETAMINOPHEN LEVEL - Abnormal; Notable for the following components:   Acetaminophen (Tylenol), Serum <10 (*)    All other components within normal limits  CBC  RAPID URINE DRUG SCREEN, HOSP PERFORMED  I-STAT BETA HCG BLOOD, ED ([facility], [facility], [facility] ONLY)  TROPONIN I (HIGH SENSITIVITY)  TROPONIN I (HIGH SENSITIVITY)    EKG None  Radiology DG Chest 2 View  Result Date: 05/19/2020 CLINICAL DATA:  Chest pain. EXAM: CHEST - 2 VIEW COMPARISON:  None. FINDINGS: The heart size and mediastinal contours are within normal limits. Both lungs are clear. The visualized skeletal structures are unremarkable. IMPRESSION: No active cardiopulmonary disease. Electronically Signed   By: Aram Candela M.D.   On: 05/19/2020 20:21    Procedures Procedures   Medications Ordered in ED Medications - No data to display  ED Course  I  have reviewed the triage vital signs and the nursing notes.  Pertinent labs & imaging results that were available during my care of the patient were reviewed by me and considered in my medical decision making (see chart for details).    MDM Rules/Calculators/A&P                          Patient sent from [facility] for CP and SOB.  She tells me that her symptoms are from anxiety.  I think she is probably right.  Plan to check labs and reassess.  Has been cleared by psych, so once her ED workup is complete, she can be discharged.  Trop is 3.  She describes her CP and SOB as intermittent and having been going on for a long time.  I think this is her anxiety.  I don't think that any further emergent workup or observation is indicated.  She does have an elevated ethanol, but she is alert and oriented and this might be where she normally lives with regard to ethanol.   Final Clinical Impression(s) / ED Diagnoses Final diagnoses:  Anxiousness    Rx / DC Orders ED Discharge Orders    None       Roxy Horseman, PA-C 05/19/20 2300    Melene Plan, DO 05/19/20 2300

## 2020-05-19 NOTE — BH Assessment (Signed)
Triage Note:   Patient is a 50 y.o. female with a history of depression and Alcohol Use Disorder who presents today stating she is "in crisis."  She is observed sobbing in triage, receiving support from parents who are here with her.  Patient shares that she got a DUI in November, and she has had worsening depression since.  She reports feeling "that I want to die."  She states she has been drinking every couple of days.  Currently, she is short of breath, tearful and complains of chest pain for the past two days.  She endorses SI, however does not disclose a plan outside of wanting to die.  She denies HI and AVH.    Patient status: URGENT

## 2020-05-19 NOTE — ED Notes (Addendum)
As per NP Shuvon Rankin, pt accepted to [facility] for medical clearance, chest pain by Dr Lynelle Doctor.  EMS transport requested.

## 2020-06-23 ENCOUNTER — Encounter (HOSPITAL_COMMUNITY): Payer: Self-pay

## 2020-06-23 ENCOUNTER — Other Ambulatory Visit: Payer: Self-pay

## 2020-06-23 ENCOUNTER — Emergency Department (HOSPITAL_COMMUNITY)
Admission: EM | Admit: 2020-06-23 | Discharge: 2020-06-24 | Disposition: A | Discharge status: Home / Self Care | Payer: BC Managed Care – PPO | Attending: Emergency Medicine | Admitting: Emergency Medicine

## 2020-06-23 DIAGNOSIS — F1010 Alcohol abuse, uncomplicated: Secondary | ICD-10-CM | POA: Diagnosis present

## 2020-06-23 DIAGNOSIS — I10 Essential (primary) hypertension: Secondary | ICD-10-CM | POA: Insufficient documentation

## 2020-06-23 DIAGNOSIS — Z20822 Contact with and (suspected) exposure to covid-19: Secondary | ICD-10-CM | POA: Diagnosis not present

## 2020-06-23 DIAGNOSIS — Y908 Blood alcohol level of 240 mg/100 ml or more: Secondary | ICD-10-CM | POA: Diagnosis not present

## 2020-06-23 DIAGNOSIS — Z7951 Long term (current) use of inhaled steroids: Secondary | ICD-10-CM | POA: Diagnosis not present

## 2020-06-23 DIAGNOSIS — F10129 Alcohol abuse with intoxication, unspecified: Secondary | ICD-10-CM | POA: Diagnosis not present

## 2020-06-23 DIAGNOSIS — Z79899 Other long term (current) drug therapy: Secondary | ICD-10-CM | POA: Insufficient documentation

## 2020-06-23 DIAGNOSIS — F17210 Nicotine dependence, cigarettes, uncomplicated: Secondary | ICD-10-CM | POA: Diagnosis not present

## 2020-06-23 DIAGNOSIS — J45909 Unspecified asthma, uncomplicated: Secondary | ICD-10-CM | POA: Diagnosis not present

## 2020-06-23 DIAGNOSIS — F10920 Alcohol use, unspecified with intoxication, uncomplicated: Secondary | ICD-10-CM

## 2020-06-23 LAB — SALICYLATE LEVEL: Salicylate Lvl: <7 mg/dL — ABNORMAL LOW (ref 7.0–30.0)

## 2020-06-23 LAB — URINALYSIS, ROUTINE W REFLEX MICROSCOPIC
Bilirubin Urine: NEGATIVE
Glucose, UA: NEGATIVE mg/dL
Ketones, ur: NEGATIVE mg/dL
Leukocytes,Ua: NEGATIVE
Nitrite: NEGATIVE
Protein, ur: NEGATIVE mg/dL
Specific Gravity, Urine: 1.008 (ref 1.005–1.030)
pH: 6 (ref 5.0–8.0)

## 2020-06-23 LAB — CBC WITH DIFFERENTIAL/PLATELET
Abs Immature Granulocytes: 0.01 10*3/uL (ref 0.00–0.07)
Basophils Absolute: 0.1 10*3/uL (ref 0.0–0.1)
Basophils Relative: 2 %
Eosinophils Absolute: 0.1 10*3/uL (ref 0.0–0.5)
Eosinophils Relative: 2 %
HCT: 37.8 % (ref 36.0–46.0)
Hemoglobin: 12.6 g/dL (ref 12.0–15.0)
Immature Granulocytes: 0 %
Lymphocytes Relative: 47 %
Lymphs Abs: 2 10*3/uL (ref 0.7–4.0)
MCH: 32.7 pg (ref 26.0–34.0)
MCHC: 33.3 g/dL (ref 30.0–36.0)
MCV: 98.2 fL (ref 80.0–100.0)
Monocytes Absolute: 0.4 10*3/uL (ref 0.1–1.0)
Monocytes Relative: 9 %
Neutro Abs: 1.8 10*3/uL (ref 1.7–7.7)
Neutrophils Relative %: 40 %
Platelets: 233 10*3/uL (ref 150–400)
RBC: 3.85 MIL/uL — ABNORMAL LOW (ref 3.87–5.11)
RDW: 14.5 % (ref 11.5–15.5)
WBC: 4.3 10*3/uL (ref 4.0–10.5)
nRBC: 0 % (ref 0.0–0.2)

## 2020-06-23 LAB — COMPREHENSIVE METABOLIC PANEL
ALT: 43 U/L (ref 0–44)
AST: 48 U/L — ABNORMAL HIGH (ref 15–41)
Albumin: 4 g/dL (ref 3.5–5.0)
Alkaline Phosphatase: 71 U/L (ref 38–126)
Anion gap: 12 (ref 5–15)
BUN: 15 mg/dL (ref 6–20)
CO2: 25 mmol/L (ref 22–32)
Calcium: 8.6 mg/dL — ABNORMAL LOW (ref 8.9–10.3)
Chloride: 108 mmol/L (ref 98–111)
Creatinine, Ser: 0.67 mg/dL (ref 0.44–1.00)
GFR, Estimated: >60 mL/min (ref >60)
Glucose, Bld: 123 mg/dL — ABNORMAL HIGH (ref 70–99)
Potassium: 3.7 mmol/L (ref 3.5–5.1)
Sodium: 145 mmol/L (ref 135–145)
Total Bilirubin: 0.4 mg/dL (ref 0.3–1.2)
Total Protein: 7.2 g/dL (ref 6.5–8.1)

## 2020-06-23 LAB — I-STAT BETA HCG BLOOD, ED (MC, WL, AP ONLY): I-stat hCG, quantitative: <5 m[IU]/mL (ref <5)

## 2020-06-23 LAB — RAPID URINE DRUG SCREEN, HOSP PERFORMED
Amphetamines: NOT DETECTED
Barbiturates: NOT DETECTED
Benzodiazepines: NOT DETECTED
Cocaine: NOT DETECTED
Opiates: NOT DETECTED
Tetrahydrocannabinol: NOT DETECTED

## 2020-06-23 LAB — ACETAMINOPHEN LEVEL: Acetaminophen (Tylenol), Serum: <10 ug/mL — ABNORMAL LOW (ref 10–30)

## 2020-06-23 LAB — RESP PANEL BY RT-PCR (FLU A&B, COVID) ARPGX2
Influenza A by PCR: NEGATIVE
Influenza B by PCR: NEGATIVE
SARS Coronavirus 2 by RT PCR: NEGATIVE

## 2020-06-23 LAB — ETHANOL: Alcohol, Ethyl (B): 351 mg/dL (ref <10)

## 2020-06-23 LAB — LIPASE, BLOOD: Lipase: 38 U/L (ref 11–51)

## 2020-06-23 MED ORDER — LORAZEPAM 2 MG/ML IJ SOLN: 0.0000 mg | Freq: Two times a day (BID) | INTRAMUSCULAR | Status: DC

## 2020-06-23 MED ORDER — B COMPLEX-C PO TABS
1.0000 | ORAL_TABLET | Freq: Every day | ORAL | Status: DC
  Administered 2020-06-24: 1 via ORAL
  Filled 2020-06-23: qty 1

## 2020-06-23 MED ORDER — FLUOXETINE HCL 20 MG PO CAPS
40.0000 mg | ORAL_CAPSULE | Freq: Every day | ORAL | Status: DC
  Administered 2020-06-24: 40 mg via ORAL
  Filled 2020-06-23: qty 2

## 2020-06-23 MED ORDER — VITAMIN D 25 MCG (1000 UNIT) PO TABS
1000.0000 [IU] | ORAL_TABLET | Freq: Every day | ORAL | Status: DC
  Administered 2020-06-24: 1000 [IU] via ORAL
  Filled 2020-06-23: qty 1

## 2020-06-23 MED ORDER — LORAZEPAM 1 MG PO TABS
0.0000 mg | ORAL_TABLET | Freq: Four times a day (QID) | ORAL | Status: DC
Start: 2020-06-23 — End: 2020-06-24
  Administered 2020-06-23: 2 mg via ORAL
  Filled 2020-06-23: qty 2

## 2020-06-23 MED ORDER — LORAZEPAM 2 MG/ML IJ SOLN: 0.0000 mg | Freq: Four times a day (QID) | INTRAMUSCULAR | Status: DC

## 2020-06-23 MED ORDER — THIAMINE HCL 100 MG/ML IJ SOLN: 100.0000 mg | Freq: Every day | INTRAMUSCULAR | Status: DC

## 2020-06-23 MED ORDER — GABAPENTIN 300 MG PO CAPS
300.0000 mg | ORAL_CAPSULE | Freq: Two times a day (BID) | ORAL | Status: DC
  Administered 2020-06-23 – 2020-06-24 (×2): 300 mg via ORAL
  Filled 2020-06-23 (×2): qty 1

## 2020-06-23 MED ORDER — ALBUTEROL SULFATE HFA 108 (90 BASE) MCG/ACT IN AERS: 2.0000 | INHALATION_SPRAY | Freq: Four times a day (QID) | RESPIRATORY_TRACT | Status: DC | PRN

## 2020-06-23 MED ORDER — IBUPROFEN 200 MG PO TABS: 600.0000 mg | ORAL_TABLET | Freq: Three times a day (TID) | ORAL | Status: DC | PRN

## 2020-06-23 MED ORDER — MOMETASONE FURO-FORMOTEROL FUM 200-5 MCG/ACT IN AERO
2.0000 | INHALATION_SPRAY | Freq: Two times a day (BID) | RESPIRATORY_TRACT | Status: DC
  Filled 2020-06-23: qty 8.8

## 2020-06-23 MED ORDER — LORAZEPAM 1 MG PO TABS: 0.0000 mg | ORAL_TABLET | Freq: Two times a day (BID) | ORAL | Status: DC

## 2020-06-23 MED ORDER — ONDANSETRON HCL 4 MG PO TABS
4.0000 mg | ORAL_TABLET | Freq: Three times a day (TID) | ORAL | Status: DC | PRN
  Administered 2020-06-23 – 2020-06-24 (×2): 4 mg via ORAL
  Filled 2020-06-23 (×2): qty 1

## 2020-06-23 MED ORDER — THIAMINE HCL 100 MG PO TABS
100.0000 mg | ORAL_TABLET | Freq: Every day | ORAL | Status: DC
  Administered 2020-06-24: 100 mg via ORAL
  Filled 2020-06-23: qty 1

## 2020-06-23 MED ORDER — LOSARTAN POTASSIUM 25 MG PO TABS
100.0000 mg | ORAL_TABLET | Freq: Every day | ORAL | Status: DC
  Filled 2020-06-23: qty 4

## 2020-06-23 NOTE — ED Triage Notes (Signed)
Emergency Medicine Provider Triage Evaluation Note  Aimee Thompson , a 50 y.o. female  was evaluated in triage.  Pt complains of alcohol use.  She states that she had an intervention by the family and coworkers and that resulted in her being here.  She denies SI/HI/  She reports that she has visual and auditory hallucinations since she was 13.   She drinks about 13 drinks a day, primarily wine.   She denies falling or any trauma.   Physical Exam  BP (!) 132/91 (BP Location: Right Arm)   Pulse 81   Temp 98.6 F (37 C) (Oral)   Resp 20   SpO2 96%  Patient is awake and alert.  She is slightly ataxic, states "I am drunk."  She doesn't have slurred speech.  She reports AVH but is not responding to internal stimuli.  Respirations are even and unlabored.  Clinically patient appears intoxicated.   Medical Decision Making  Medically screening exam initiated at 9:36 PM.  Appropriate orders placed.  SHERENE PLANCARTE was informed that the remainder of the evaluation will be completed by another provider, this initial triage assessment does not replace that evaluation, and the importance of remaining in the ED until their evaluation is complete.    [place], [place], [place] 06/23/20 2140

## 2020-06-23 NOTE — ED Provider Notes (Signed)
[facility]-EMERGENCY DEPT Provider Note   CSN: [id number] Arrival date & time: 06/23/20  2124     History Chief Complaint  Patient presents with  . Alcohol Problem    Aimee Thompson is a 50 y.o. female.  Pt presents to the ED today to get help with her drinking.  Pt told me that she drinks "a lot."  She told the PA that she has around 13 drinks per day.  Pt's family and coworkers did an intervention today and that is why she is here today.  She has been in rehab before, but not for several years.   No si/hi.  She has tried to quit by herself without success.  She denies hx of seizures with quitting.        Past Medical History:  Diagnosis Date  . ADD (attention deficit disorder)   . ASTHMA   . EtOH dependence (HCC)   . HEART MURMUR, HX OF   . Hypertension   . Hypertriglyceridemia, familial     Patient Active Problem List   Diagnosis Date Noted  . Alcohol use disorder, severe, dependence (HCC) 05/19/2020  . Chest pain 05/19/2020  . MDD (major depressive disorder), recurrent severe, without psychosis (HCC) 10/25/2018  . Alcohol dependence with alcohol-induced mood disorder (HCC)   . Alcohol use 11/21/2017  . Hyperglycemia 10/20/2016  . Acute pancreatic necrosis   . Distended abdomen   . Hypertriglyceridemia 10/01/2015  . Acute pancreatitis 09/30/2015  . Abdominal pain 03/26/2015  . Hematochezia 03/26/2015  . Right hand pain 07/01/2014  . Acute sinus infection 01/24/2013  . Other malaise and fatigue 10/31/2012  . Night sweats 10/31/2012  . Essential hypertension, benign 10/31/2012  . Anxiety and depression 07/11/2011  . Vertigo 08/09/2010  . ADD (attention deficit disorder) 08/09/2010  . Preventative health care 08/06/2010  . Asthma 06/01/2010  . WEIGHT LOSS 06/03/2009  . HEART MURMUR, HX OF 11/03/2006    Past Surgical History:  Procedure Laterality Date  . CESAREAN SECTION  2011  . KNEE SURGERY  1990  . laproscopy    . SHOULDER  SURGERY Right 2012     OB History   No obstetric history on file.     Family History  Problem Relation Age of Onset  . Hypertension Other   . Colon cancer Neg Hx   . Esophageal cancer Neg Hx   . Pancreatic cancer Neg Hx   . Colitis Neg Hx   . Crohn's disease Neg Hx   . Kidney disease Neg Hx   . Liver disease Neg Hx   . Heart disease Neg Hx   . Stomach cancer Neg Hx     Social History   Tobacco Use  . Smoking status: Never Smoker  . Smokeless tobacco: Never Used  Vaping Use  . Vaping Use: Never used  Substance Use Topics  . Alcohol use: Yes    Alcohol/week: 0.0 standard drinks    Comment: 1-2 bottles of wine/day  . Drug use: No    Home Medications Prior to Admission medications   Medication Sig Start Date End Date Taking? Authorizing Provider  albuterol (PROVENTIL HFA;VENTOLIN HFA) 108 (90 Base) MCG/ACT inhaler Inhale 2 puffs into the lungs every 6 (six) hours as needed for wheezing or shortness of breath. 03/26/15   Corwin Levins, MD  B Complex-C (SUPER B COMPLEX/VITAMIN C PO) Take 1 tablet by mouth daily.    [provider]  budesonide-formoterol (SYMBICORT) 160-4.5 MCG/ACT inhaler Inhale 2 puffs  into the lungs 2 (two) times daily. For Shortness of breath Patient taking differently: Inhale 2 puffs into the lungs 2 (two) times daily as needed (For shortness of breath.).  06/04/13   Corwin Levins, MD  chlordiazePOXIDE (LIBRIUM) 10 MG capsule Take 10 mg by mouth 3 (three) times daily as needed for anxiety.    [provider]  cholecalciferol (VITAMIN D3) 25 MCG (1000 UT) tablet Take 1,000 Units by mouth daily.    [provider]  fenofibrate 160 MG tablet Take 1 tablet (160 mg total) by mouth daily. Patient not taking: Reported on 02/28/2019 12/16/15   Corwin Levins, MD  FLUoxetine (PROZAC) 40 MG capsule Take 40 mg by mouth daily. 01/01/19   [provider]  gabapentin (NEURONTIN) 300 MG capsule Take 1 capsule (300 mg total) by mouth 2  (two) times daily. 10/29/18   Aldean Baker, NP  hydroxypropyl methylcellulose / hypromellose (ISOPTO TEARS / GONIOVISC) 2.5 % ophthalmic solution Place 1 drop into both eyes 3 (three) times daily as needed for dry eyes.    [provider]  losartan (COZAAR) 100 MG tablet Take 1 tablet (100 mg total) by mouth daily. 11/21/17   Corwin Levins, MD  lurasidone (LATUDA) 20 MG TABS tablet Take 20 mg by mouth daily.    [provider]  mirtazapine (REMERON SOL-TAB) 15 MG disintegrating tablet Take 1 tablet (15 mg total) by mouth at bedtime. Patient not taking: Reported on 02/28/2019 10/29/18   Aldean Baker, NP  naltrexone (DEPADE) 50 MG tablet Take 50 mg by mouth daily. 12/31/18   [provider]  Vilazodone HCl (VIIBRYD) 10 MG TABS Take 2 tablets (20 mg total) by mouth daily. Patient not taking: Reported on 02/28/2019 10/30/18   Aldean Baker, NP  vitamin B-12 (CYANOCOBALAMIN) 100 MCG tablet Take 100 mcg by mouth daily.    [provider]  zaleplon (SONATA) 10 MG capsule Take 10 mg by mouth at bedtime as needed for sleep.    [provider]    Allergies    Penicillins and Scallops [shellfish allergy]  Review of Systems   Review of Systems  All other systems reviewed and are negative.   Physical Exam Updated Vital Signs BP 133/85   Pulse 81   Temp 98.6 F (37 C) (Oral)   Resp 20   Ht 5' 2.5" (1.588 m)   Wt 78 kg   SpO2 96%   BMI 30.96 kg/m   Physical Exam Vitals and nursing note reviewed.  Constitutional:      Appearance: Normal appearance.  HENT:     Head: Normocephalic and atraumatic.     Right Ear: External ear normal.     Left Ear: External ear normal.     Nose: Nose normal.     Mouth/Throat:     Mouth: Mucous membranes are moist.     Pharynx: Oropharynx is clear.  Eyes:     Extraocular Movements: Extraocular movements intact.     Pupils: Pupils are equal, round, and reactive to light.  Cardiovascular:     Rate and Rhythm:  Normal rate and regular rhythm.     Pulses: Normal pulses.     Heart sounds: Normal heart sounds.  Pulmonary:     Effort: Pulmonary effort is normal.     Breath sounds: Normal breath sounds.  Abdominal:     General: Abdomen is flat. Bowel sounds are normal.     Palpations: Abdomen is soft.  Musculoskeletal:  General: Normal range of motion.     Cervical back: Normal range of motion and neck supple.  Skin:    General: Skin is warm.     Capillary Refill: Capillary refill takes less than 2 seconds.  Neurological:     General: No focal deficit present.     Mental Status: She is alert and oriented to person, place, and time.  Psychiatric:        Mood and Affect: Mood normal.        Behavior: Behavior normal.        Thought Content: Thought content normal.        Judgment: Judgment normal.     ED Results / Procedures / Treatments   Labs (all labs ordered are listed, but only abnormal results are displayed) Labs Reviewed  COMPREHENSIVE METABOLIC PANEL - Abnormal; Notable for the following components:      Result Value   Glucose, Bld 123 (*)    Calcium 8.6 (*)    AST 48 (*)    All other components within normal limits  ETHANOL - Abnormal; Notable for the following components:   Alcohol, Ethyl (B) 351 (*)    All other components within normal limits  ACETAMINOPHEN LEVEL - Abnormal; Notable for the following components:   Acetaminophen (Tylenol), Serum <10 (*)    All other components within normal limits  SALICYLATE LEVEL - Abnormal; Notable for the following components:   Salicylate Lvl <7.0 (*)    All other components within normal limits  CBC WITH DIFFERENTIAL/PLATELET - Abnormal; Notable for the following components:   RBC 3.85 (*)    All other components within normal limits  RESP PANEL BY RT-PCR (FLU A&B, COVID) ARPGX2  LIPASE, BLOOD  URINALYSIS, ROUTINE W REFLEX MICROSCOPIC  RAPID URINE DRUG SCREEN, HOSP PERFORMED  I-STAT BETA HCG BLOOD, ED ([facility], [facility], [facility] ONLY)     EKG None  Radiology No results found.  Procedures Procedures   Medications Ordered in ED Medications  LORazepam (ATIVAN) injection 0-4 mg (has no administration in time range)    Or  LORazepam (ATIVAN) tablet 0-4 mg (has no administration in time range)  LORazepam (ATIVAN) injection 0-4 mg (has no administration in time range)    Or  LORazepam (ATIVAN) tablet 0-4 mg (has no administration in time range)  thiamine tablet 100 mg (has no administration in time range)    Or  thiamine (B-1) injection 100 mg (has no administration in time range)  ondansetron (ZOFRAN) tablet 4 mg (has no administration in time range)  ibuprofen (ADVIL) tablet 600 mg (has no administration in time range)  albuterol (VENTOLIN HFA) 108 (90 Base) MCG/ACT inhaler 2 puff (has no administration in time range)  Super B Complex/Vitamin C TABS 1 tablet (has no administration in time range)  mometasone-formoterol (DULERA) 200-5 MCG/ACT inhaler 2 puff (has no administration in time range)  cholecalciferol (VITAMIN D3) tablet 1,000 Units (has no administration in time range)  FLUoxetine (PROZAC) capsule 40 mg (has no administration in time range)  gabapentin (NEURONTIN) capsule 300 mg (has no administration in time range)  losartan (COZAAR) tablet 100 mg (has no administration in time range)    ED Course  I have reviewed the triage vital signs and the nursing notes.  Pertinent labs & imaging results that were available during my care of the patient were reviewed by me and considered in my medical decision making (see chart for details).    MDM Rules/Calculators/A&P  TTS consult ordered.  Psych hold with CIWA orders placed.    Disposition per TTS. Final Clinical Impression(s) / ED Diagnoses Final diagnoses:  Alcohol abuse  Alcoholic intoxication without complication [facility](HCC)    Rx / DC Orders ED Discharge Orders    None       Jacalyn LefevreHaviland, Shakiara Lukic, MD 06/23/20 2305

## 2020-06-23 NOTE — ED Triage Notes (Signed)
Pt reports alcohol problem. Says that she typically has 13 drinks a day and last drink was 35 minutes prior to arrival. Pt was directed to ER for help and detox by family members. Denies si/ hi. No tremors observed. No hx of seizures.

## 2020-06-24 NOTE — ED Notes (Signed)
TTS consult taking place.  Patient moved to empty room with cart for privacy.

## 2020-06-24 NOTE — ED Notes (Signed)
TTS paged and aware pt is awake and ready for assessment.

## 2020-06-24 NOTE — ED Notes (Signed)
Pt resting with eyes closed. Belongings placed in pt labeled bags. VSS at this time. Pt denies any complaints or concerns at this time.

## 2020-06-24 NOTE — ED Provider Notes (Addendum)
Emergency Medicine Observation Re-evaluation Note  Aimee Thompson is a 50 y.o. female, seen on rounds today.  Pt initially presented to the ED for complaints of Alcohol Problem Currently, the patient is resting in bed.  Physical Exam  BP 118/83 (BP Location: Left Arm)   Pulse 79   Temp 98.1 F (36.7 C) (Oral)   Resp 17   Ht 5' 2.5" (1.588 m)   Wt 78 kg   SpO2 97%   BMI 30.96 kg/m  Physical Exam General: resting comfortably, NAD Lungs: normal WOB Psych: currently calm and resting  ED Course / MDM  EKG:   I have reviewed the labs performed to date as well as medications administered while in observation.  Recent changes in the last 24 hours include none.  Plan  Current plan is awaiting behavioral health evaluation.   Rozelle Logan, DO 06/24/20 9191    Rozelle Logan, DO 06/24/20 0945
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
## 2020-06-24 NOTE — ED Notes (Signed)
Report received from Hayley, RN.

## 2020-06-24 NOTE — ED Notes (Signed)
Meal Tray given 

## 2020-06-24 NOTE — BH Assessment (Signed)
TTS attempted to complete assessment. Per Rolly Salter, RN, patient given Ativan and is asleep, unable to arouse to participate in TTS assessment. TTS will attempt at later time.

## 2020-06-24 NOTE — ED Notes (Signed)
Per [place], EDp ok for patient to discharge at this time.

## 2020-06-24 NOTE — ED Notes (Signed)
Pt sleeping. 

## 2020-06-24 NOTE — ED Provider Notes (Signed)
Behavioral health has evaluated the patient.  She is currently working with her insurance company for inpatient substance abuse care in [place].  She was given outpatient resources in the event that this would follow through.  Patient has no SI/HI.  She is otherwise alert, oriented, appropriate and has capacity.  She will pursue inpatient substance abuse treatment through her insurance company in [place].   Rozelle Logan, DO 06/24/20 1445

## 2021-07-07 IMAGING — DX DG CHEST 2V
2 series · 2 of 2 positions shown · non-contrast
Comparison: October 24, 2018.

CLINICAL DATA: Chest pain.

EXAM:
CHEST - 2 VIEW

[chest pa]
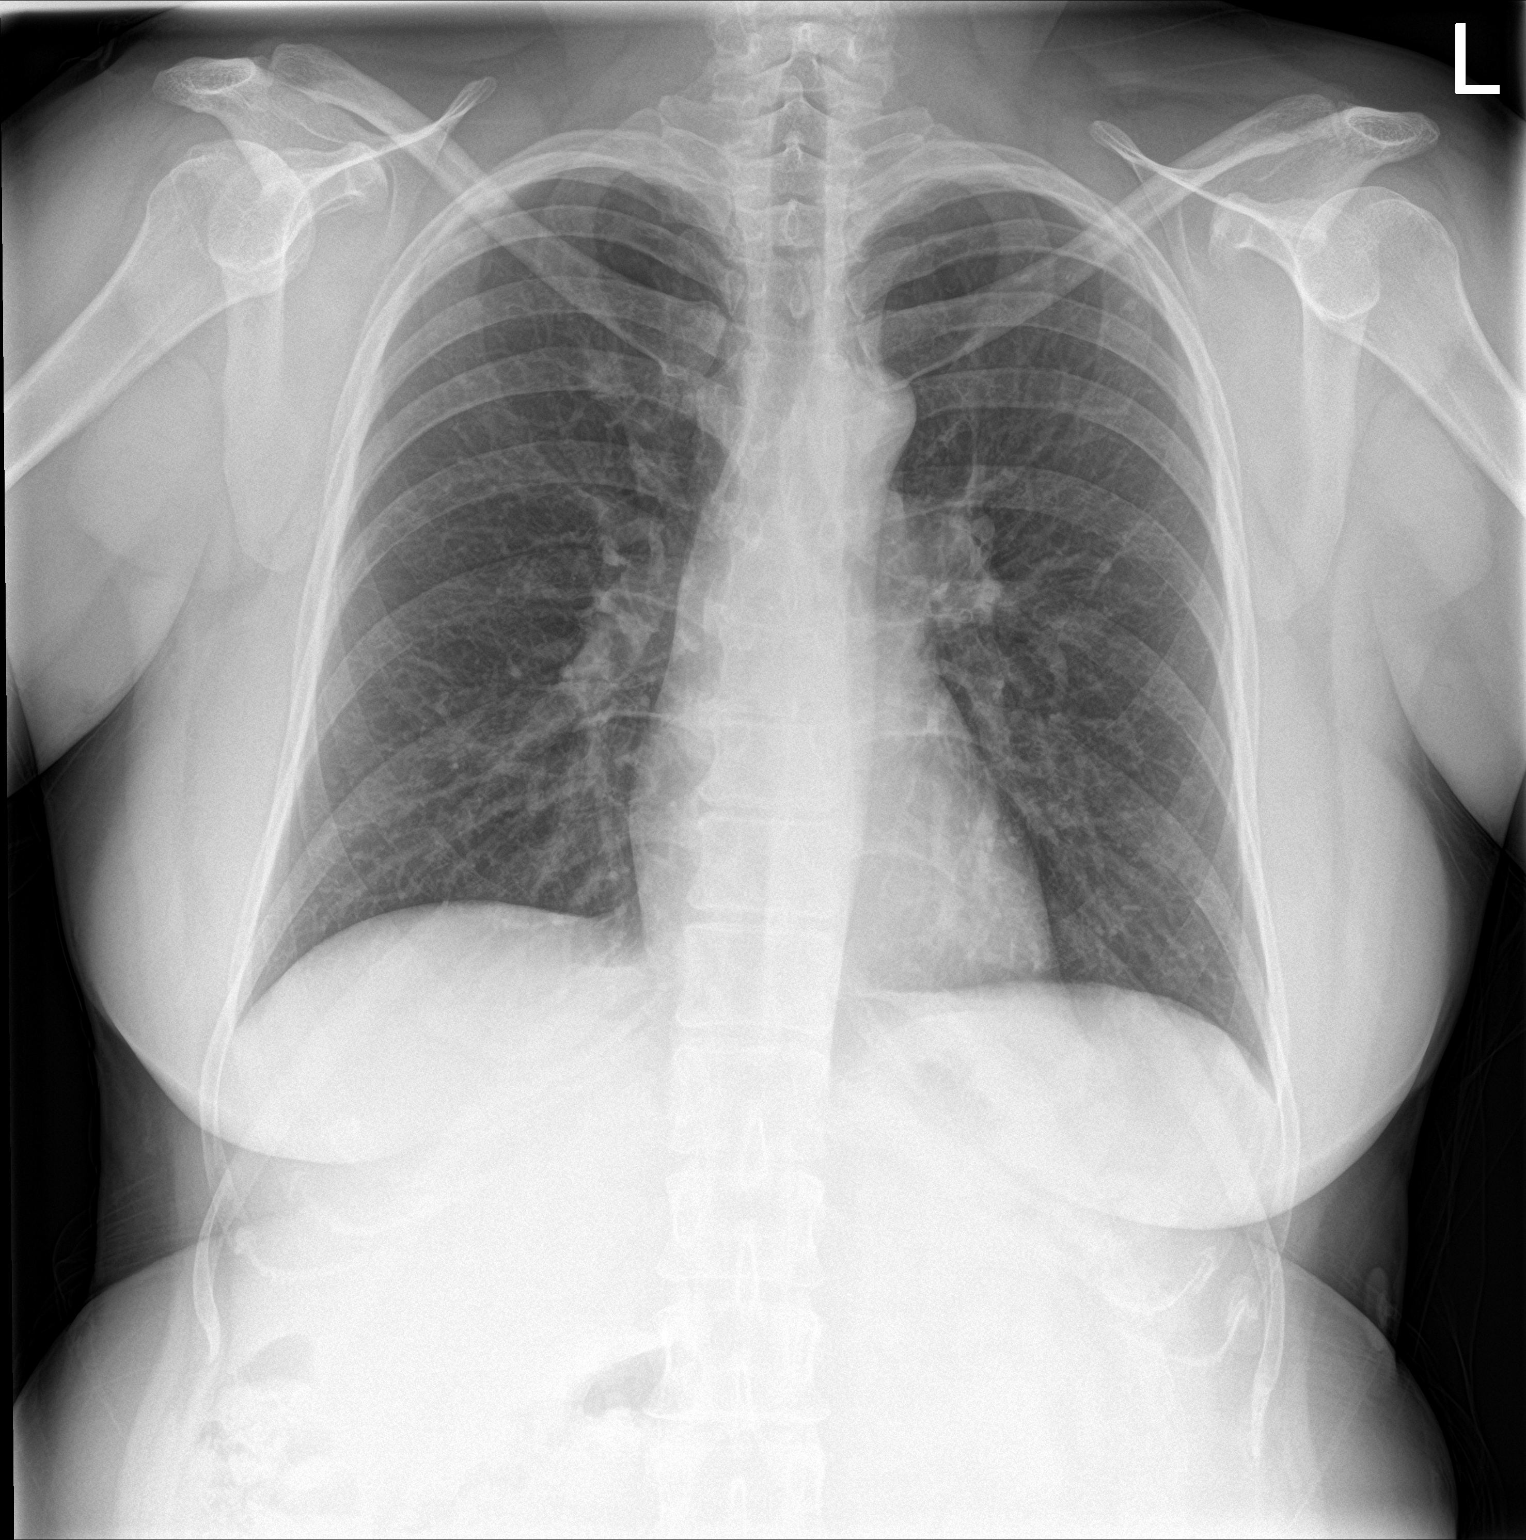

[chest lat]
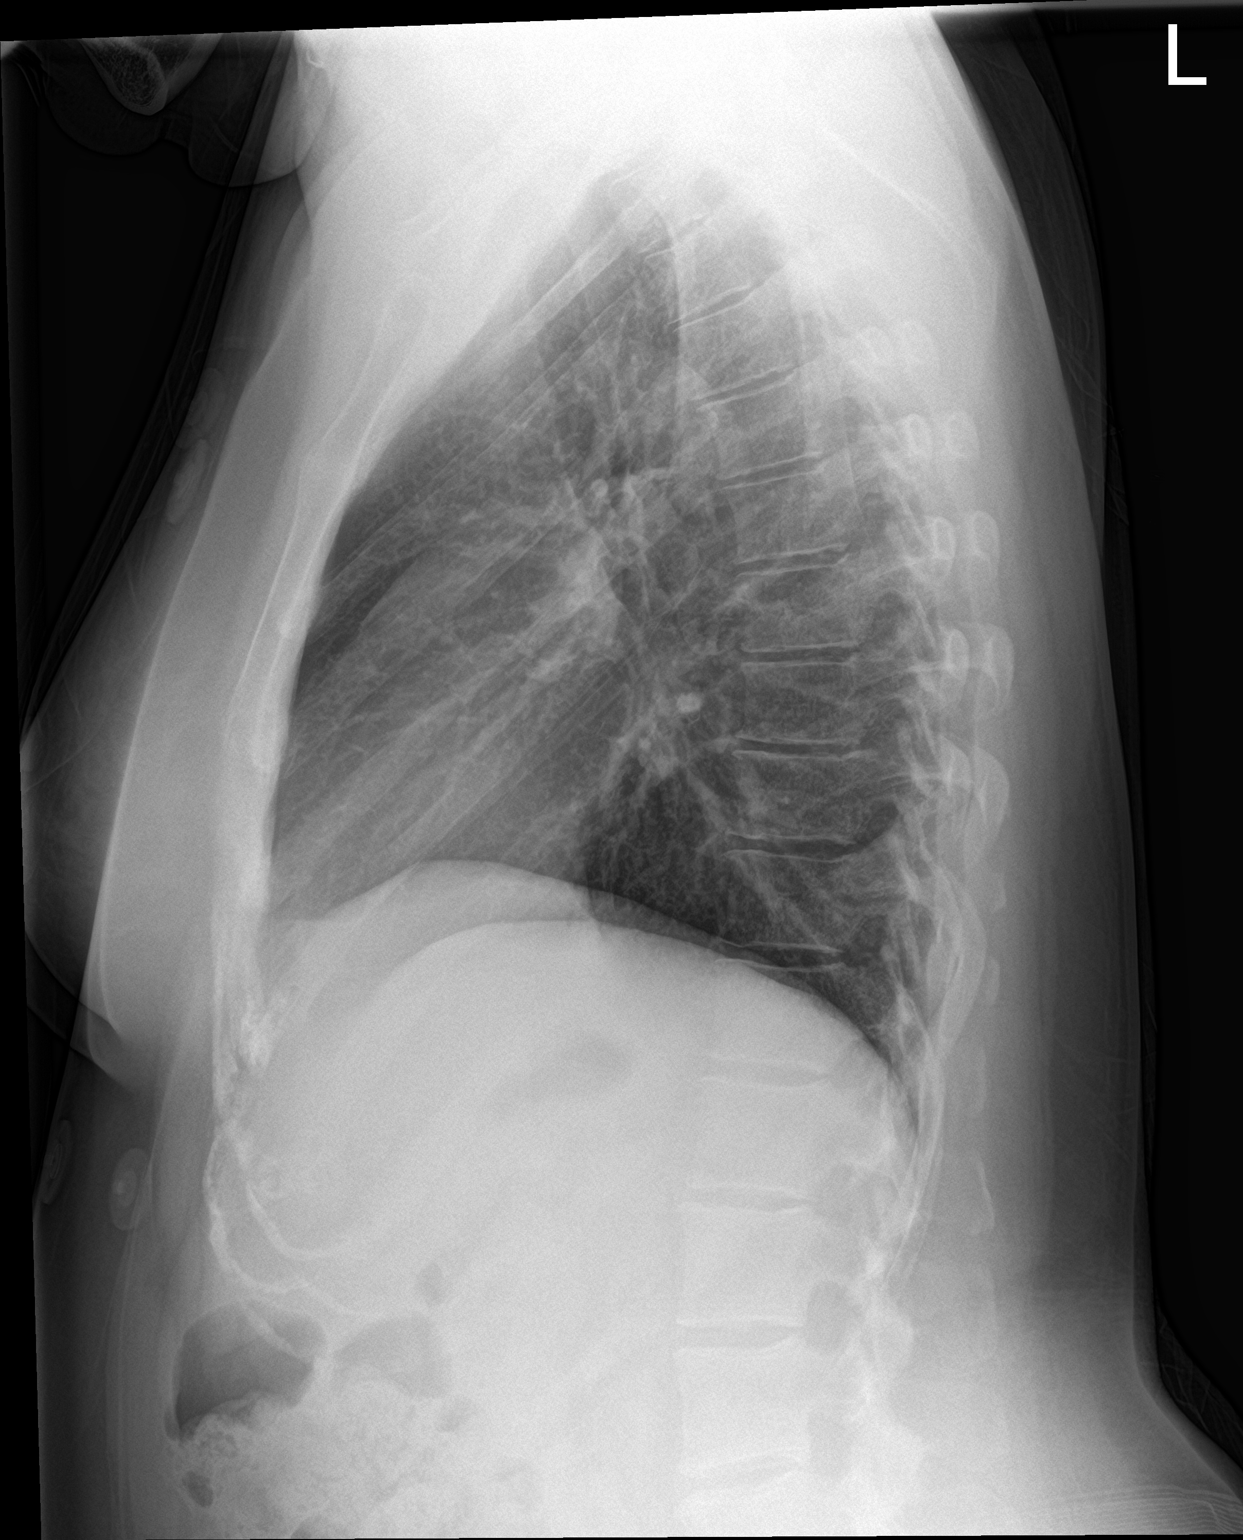

[2 of 2 positions shown; findings below may reference images not displayed]

FINDINGS: The heart size and mediastinal contours are within normal limits.
Both lungs are clear. No pneumothorax or pleural effusion is noted.
The visualized skeletal structures are unremarkable.
IMPRESSION: No active cardiopulmonary disease.
